# Patient Record
Sex: Male | Born: 1935 | Marital: Married | Smoking: Former smoker
Health system: Southern US, Community
[De-identification: ages and names within clinical notes are randomized; demographics above are authoritative.]

## PROBLEM LIST (undated history)

## (undated) DIAGNOSIS — N183 Chronic kidney disease, stage 3 unspecified: Secondary | ICD-10-CM

## (undated) DIAGNOSIS — E119 Type 2 diabetes mellitus without complications: Secondary | ICD-10-CM

## (undated) DIAGNOSIS — R011 Cardiac murmur, unspecified: Secondary | ICD-10-CM

## (undated) DIAGNOSIS — I340 Nonrheumatic mitral (valve) insufficiency: Secondary | ICD-10-CM

## (undated) DIAGNOSIS — I447 Left bundle-branch block, unspecified: Secondary | ICD-10-CM

## (undated) DIAGNOSIS — I509 Heart failure, unspecified: Secondary | ICD-10-CM

## (undated) DIAGNOSIS — I10 Essential (primary) hypertension: Secondary | ICD-10-CM

## (undated) DIAGNOSIS — I480 Paroxysmal atrial fibrillation: Secondary | ICD-10-CM

## (undated) DIAGNOSIS — E785 Hyperlipidemia, unspecified: Secondary | ICD-10-CM

## (undated) HISTORY — PX: OTHER SURGICAL HISTORY: SHX169

## (undated) HISTORY — DX: Cardiac murmur, unspecified: R01.1

## (undated) HISTORY — DX: Heart failure, unspecified: I50.9

## (undated) HISTORY — DX: Essential (primary) hypertension: I10

## (undated) HISTORY — PX: MOLE REMOVAL: SHX2046

## (undated) HISTORY — DX: Hyperlipidemia, unspecified: E78.5

---

## 2012-02-13 ENCOUNTER — Ambulatory Visit (INDEPENDENT_AMBULATORY_CARE_PROVIDER_SITE_OTHER): Payer: Medicare Other | Admitting: Family

## 2012-02-13 ENCOUNTER — Encounter: Payer: Self-pay | Admitting: Family

## 2012-02-13 VITALS — BP 150/82 | HR 47 | Temp 98.1°F | Ht 71.75 in | Wt 204.0 lb

## 2012-02-13 DIAGNOSIS — E119 Type 2 diabetes mellitus without complications: Secondary | ICD-10-CM

## 2012-02-13 DIAGNOSIS — I10 Essential (primary) hypertension: Secondary | ICD-10-CM

## 2012-02-13 DIAGNOSIS — N400 Enlarged prostate without lower urinary tract symptoms: Secondary | ICD-10-CM

## 2012-02-13 DIAGNOSIS — E7800 Pure hypercholesterolemia, unspecified: Secondary | ICD-10-CM

## 2012-02-13 LAB — PSA: PSA: 2.32 ng/mL (ref 0.10–4.00)

## 2012-02-13 LAB — HEPATIC FUNCTION PANEL
Alkaline Phosphatase: 49 U/L (ref 39–117)
Bilirubin, Direct: 0.1 mg/dL (ref 0.0–0.3)
Total Bilirubin: 0.8 mg/dL (ref 0.3–1.2)
Total Protein: 7.1 g/dL (ref 6.0–8.3)

## 2012-02-13 LAB — LIPID PANEL
HDL: 29.6 mg/dL — ABNORMAL LOW (ref >39.00)
LDL Cholesterol: 57 mg/dL (ref 0–99)
Total CHOL/HDL Ratio: 4
Triglycerides: 174 mg/dL — ABNORMAL HIGH (ref 0.0–149.0)
VLDL: 34.8 mg/dL (ref 0.0–40.0)

## 2012-02-13 LAB — HEMOGLOBIN A1C: Hgb A1c MFr Bld: 5.8 % (ref 4.6–6.5)

## 2012-02-13 MED ORDER — ATENOLOL 50 MG PO TABS: 50.0000 mg | ORAL_TABLET | Freq: Every day | ORAL | Status: DC

## 2012-02-13 MED ORDER — LOSARTAN POTASSIUM 100 MG PO TABS: 100.0000 mg | ORAL_TABLET | Freq: Every day | ORAL | Status: DC

## 2012-02-13 MED ORDER — HYDROCHLOROTHIAZIDE 25 MG PO TABS: 25.0000 mg | ORAL_TABLET | Freq: Every day | ORAL | Status: DC

## 2012-02-13 MED ORDER — SIMVASTATIN 20 MG PO TABS: 20.0000 mg | ORAL_TABLET | Freq: Every evening | ORAL | Status: DC

## 2012-02-13 MED ORDER — METFORMIN HCL ER (OSM) 500 MG PO TB24: 500.0000 mg | ORAL_TABLET | Freq: Two times a day (BID) | ORAL | Status: DC

## 2012-02-13 NOTE — Progress Notes (Signed)
Subjective:    Patient ID: Stanley Moore, male    DOB: 1931/05/07, 79 y.o.   MRN: [id number]  HPI 79 year old white male, nonsmoker, new patient to the practice is in to be established. Denies any concerns today. He has a history of type 2 diabetes, hypertension, hyperlipidemia and is currently doing well on this medications. Reports his blood pressure typically run in the 0000000 to Q000111Q systolically. Denies any lightheadedness, dizziness, chest pain, palpitations, shortness of breath or edema.  Review of Systems  Constitutional: Negative.   HENT: Negative.   Respiratory: Negative.   Cardiovascular: Negative.   Gastrointestinal: Negative.   Genitourinary: Negative.   Musculoskeletal: Negative.   Skin: Negative.   Neurological: Negative.   Hematological: Negative.   Psychiatric/Behavioral: Negative.    Past Medical History  Diagnosis Date  . Hyperlipidemia   . Hypertension     History   Social History  . Marital Status: Unknown    Spouse Name: N/A    Number of Children: N/A  . Years of Education: N/A   Occupational History  . Not on file.   Social History Main Topics  . Smoking status: Former Research scientist (life sciences)  . Smokeless tobacco: Not on file  . Alcohol Use: Yes     Comment: wine with dinner  . Drug Use: No  . Sexually Active:    Other Topics Concern  . Not on file   Social History Narrative  . No narrative on file    Past Surgical History  Procedure Date  . Mole removal     No family history on file.  No Known Allergies  Current Outpatient Prescriptions on File Prior to Visit  Medication Sig Dispense Refill  . atenolol (TENORMIN) 50 MG tablet Take 1 tablet (50 mg total) by mouth daily.  90 tablet  1  . hydrochlorothiazide (HYDRODIURIL) 25 MG tablet Take 1 tablet (25 mg total) by mouth daily.  90 tablet  1  . losartan (COZAAR) 100 MG tablet Take 1 tablet (100 mg total) by mouth daily.  90 tablet  1  . metformin (FORTAMET) 500 MG (OSM) 24 hr tablet Take 1  tablet (500 mg total) by mouth 2 (two) times daily with a meal.  180 tablet  1  . simvastatin (ZOCOR) 20 MG tablet Take 1 tablet (20 mg total) by mouth every evening.  90 tablet  1    BP 150/82  Pulse 47  Temp 98.1 F (36.7 C) (Oral)  Ht 5' 11.75" (1.822 m)  Wt 204 lb (92.534 kg)  BMI 27.86 kg/m2  SpO2 98%chart    Objective:   Physical Exam  Constitutional: He is oriented to person, place, and time. He appears well-developed and well-nourished.  HENT:  Right Ear: External ear normal.  Left Ear: External ear normal.  Nose: Nose normal.  Mouth/Throat: Oropharynx is clear and moist.  Neck: Normal range of motion. Neck supple.  Cardiovascular: Normal rate, regular rhythm and normal heart sounds.   Pulmonary/Chest: Effort normal and breath sounds normal.  Abdominal: Soft. Bowel sounds are normal.  Musculoskeletal: Normal range of motion.  Neurological: He is alert and oriented to person, place, and time. He has normal reflexes.  Skin: Skin is warm and dry.  Psychiatric: He has a normal mood and affect.          Assessment & Plan:  Assessment:  1. Type 2 DM 2. Hypertension 3. Hypercholesterolemia   Plan: Increase Cozaar to 100 mg daily. Continue all other medications the same. Lab sent.  Encouraged healthy diet, exercise. Will notify patient pending results of his labs. Recheck in 3-4 months for complete physical exam and sooner as needed.

## 2012-02-13 NOTE — Patient Instructions (Addendum)
Colonoscopy A colonoscopy is an exam to evaluate your entire colon. In this exam, your colon is cleansed. A long fiberoptic tube is inserted through your rectum and into your colon. The fiberoptic scope (endoscope) is a long bundle of enclosed and very flexible fibers. These fibers transmit light to the area examined and send images from that area to your caregiver. Discomfort is usually minimal. You may be given a drug to help you sleep (sedative) during or prior to the procedure. This exam helps to detect lumps (tumors), polyps, inflammation, and areas of bleeding. Your caregiver may also take a small piece of tissue (biopsy) that will be examined under a microscope. LET YOUR CAREGIVER KNOW ABOUT:   Allergies to food or medicine.  Medicines taken, including vitamins, herbs, eyedrops, over-the-counter medicines, and creams.  Use of steroids (by mouth or creams).  Previous problems with anesthetics or numbing medicines.  History of bleeding problems or blood clots.  Previous surgery.  Other health problems, including diabetes and kidney problems.  Possibility of pregnancy, if this applies. BEFORE THE PROCEDURE   A clear liquid diet may be required for 2 days before the exam.  Ask your caregiver about changing or stopping your regular medications.  Liquid injections (enemas) or laxatives may be required.  A large amount of electrolyte solution may be given to you to drink over a short period of time. This solution is used to clean out your colon.  You should be present 60 minutes prior to your procedure or as directed by your caregiver. AFTER THE PROCEDURE   If you received a sedative or pain relieving medication, you will need to arrange for someone to drive you home.  Occasionally, there is a little blood passed with the first bowel movement. Do not be concerned. FINDING OUT THE RESULTS OF YOUR TEST Not all test results are available during your visit. If your test results are  not back during the visit, make an appointment with your caregiver to find out the results. Do not assume everything is normal if you have not heard from your caregiver or the medical facility. It is important for you to follow up on all of your test results. HOME CARE INSTRUCTIONS   It is not unusual to pass moderate amounts of gas and experience mild abdominal cramping following the procedure. This is due to air being used to inflate your colon during the exam. Walking or a warm pack on your belly (abdomen) may help.  You may resume all normal meals and activities after sedatives and medicines have worn off.  Only take over-the-counter or prescription medicines for pain, discomfort, or fever as directed by your caregiver. Do not use aspirin or blood thinners if a biopsy was taken. Consult your caregiver for medicine usage if biopsies were taken. SEEK IMMEDIATE MEDICAL CARE IF:   You have a fever.  You pass large blood clots or fill a toilet with blood following the procedure. This may also occur 10 to 14 days following the procedure. This is more likely if a biopsy was taken.  You develop abdominal pain that keeps getting worse and cannot be relieved with medicine. Document Released: 12/21/1999 Document Revised: 03/17/2011 Document Reviewed: 08/05/2007 [employer] Patient Information 2013 [employer].  

## 2012-04-06 ENCOUNTER — Encounter: Payer: Self-pay | Admitting: Family

## 2012-04-06 ENCOUNTER — Ambulatory Visit (INDEPENDENT_AMBULATORY_CARE_PROVIDER_SITE_OTHER): Payer: Medicare Other | Admitting: Family

## 2012-04-06 VITALS — BP 164/80 | HR 68 | Wt 205.0 lb

## 2012-04-06 DIAGNOSIS — E119 Type 2 diabetes mellitus without complications: Secondary | ICD-10-CM

## 2012-04-06 DIAGNOSIS — E7800 Pure hypercholesterolemia, unspecified: Secondary | ICD-10-CM

## 2012-04-06 DIAGNOSIS — I10 Essential (primary) hypertension: Secondary | ICD-10-CM

## 2012-04-06 MED ORDER — LOSARTAN POTASSIUM-HCTZ 100-25 MG PO TABS: 1.0000 | ORAL_TABLET | Freq: Every day | ORAL | Status: DC

## 2012-04-06 MED ORDER — ATENOLOL 50 MG PO TABS: 50.0000 mg | ORAL_TABLET | Freq: Two times a day (BID) | ORAL | Status: DC

## 2012-04-06 NOTE — Patient Instructions (Addendum)
1. You are beautiful! 2. Continue exercise 3. Watch salt intake.  4. I have changed Cozaar and HCTZ to 1 pill. So the next time you get it filled, it will be 1 pill.  Hypertension As your heart beats, it forces blood through your arteries. This force is your blood pressure. If the pressure is too high, it is called hypertension (HTN) or high blood pressure. HTN is dangerous because you may have it and not know it. High blood pressure may mean that your heart has to work harder to pump blood. Your arteries may be narrow or stiff. The extra work puts you at risk for heart disease, stroke, and other problems.  Blood pressure consists of two numbers, a higher number over a lower, 110/72, for example. It is stated as "110 over 72." The ideal is below 120 for the top number (systolic) and under 80 for the bottom (diastolic). Write down your blood pressure today. You should pay close attention to your blood pressure if you have certain conditions such as:  Heart failure.  Prior heart attack.  Diabetes  Chronic kidney disease.  Prior stroke.  Multiple risk factors for heart disease. To see if you have HTN, your blood pressure should be measured while you are seated with your arm held at the level of the heart. It should be measured at least twice. A one-time elevated blood pressure reading (especially in the Emergency Department) does not mean that you need treatment. There may be conditions in which the blood pressure is different between your right and left arms. It is important to see your caregiver soon for a recheck. Most people have essential hypertension which means that there is not a specific cause. This type of high blood pressure may be lowered by changing lifestyle factors such as:  Stress.  Smoking.  Lack of exercise.  Excessive weight.  Drug/tobacco/alcohol use.  Eating less salt. Most people do not have symptoms from high blood pressure until it has caused damage to the  body. Effective treatment can often prevent, delay or reduce that damage. TREATMENT  When a cause has been identified, treatment for high blood pressure is directed at the cause. There are a large number of medications to treat HTN. These fall into several categories, and your caregiver will help you select the medicines that are best for you. Medications may have side effects. You should review side effects with your caregiver. If your blood pressure stays high after you have made lifestyle changes or started on medicines,   Your medication(s) may need to be changed.  Other problems may need to be addressed.  Be certain you understand your prescriptions, and know how and when to take your medicine.  Be sure to follow up with your caregiver within the time frame advised (usually within two weeks) to have your blood pressure rechecked and to review your medications.  If you are taking more than one medicine to lower your blood pressure, make sure you know how and at what times they should be taken. Taking two medicines at the same time can result in blood pressure that is too low. SEEK IMMEDIATE MEDICAL CARE IF:  You develop a severe headache, blurred or changing vision, or confusion.  You have unusual weakness or numbness, or a faint feeling.  You have severe chest or abdominal pain, vomiting, or breathing problems. MAKE SURE YOU:   Understand these instructions.  Will watch your condition.  Will get help right away if you are not  doing well or get worse. Document Released: 12/23/2004 Document Revised: 03/17/2011 Document Reviewed: 08/13/2007 [facility] Patient Information 2013 [place].

## 2012-04-06 NOTE — Progress Notes (Signed)
Subjective:    Patient ID: Stanley Moore, male    DOB: Jan 08, 1931, 79 y.o.   MRN: [id number]  HPI  79 year old male, nonsmoker is in for recheck of hypertension, hyperlipidemia, type 2 diabetes-controlled, hyperlipidemia. He is currently tolerating his medications well. Denies any concerns today. Blood pressure readings at home have been in the [id number] to 123456 systolically. He has been taken atenolol 50 mg twice a day instead of once a day. He has noticed that her blood pressure readings since then.    Review of Systems  Constitutional: Negative.   HENT: Negative.   Eyes: Negative.   Respiratory: Negative.   Cardiovascular: Negative.   Gastrointestinal: Negative.   Endocrine: Negative.   Genitourinary: Negative.   Musculoskeletal: Negative.   Skin: Negative.   Allergic/Immunologic: Negative.   Neurological: Negative.   Hematological: Negative.   Psychiatric/Behavioral: Negative.    Past Medical History  Diagnosis Date  . Hyperlipidemia   . Hypertension     History   Social History  . Marital Status: Unknown    Spouse Name: N/A    Number of Children: N/A  . Years of Education: N/A   Occupational History  . Not on file.   Social History Main Topics  . Smoking status: Former Research scientist (life sciences)  . Smokeless tobacco: Not on file  . Alcohol Use: Yes     Comment: wine with dinner  . Drug Use: No  . Sexually Active:    Other Topics Concern  . Not on file   Social History Narrative  . No narrative on file    Past Surgical History  Procedure Laterality Date  . Mole removal      No family history on file.  No Known Allergies  Current Outpatient Prescriptions on File Prior to Visit  Medication Sig Dispense Refill  . aspirin 81 MG tablet Take 81 mg by mouth daily.      . metformin (FORTAMET) 500 MG (OSM) 24 hr tablet Take 1 tablet (500 mg total) by mouth 2 (two) times daily with a meal.  180 tablet  1  . NON FORMULARY Joint Softner      . simvastatin (ZOCOR) 20 MG  tablet Take 1 tablet (20 mg total) by mouth every evening.  90 tablet  1   No current facility-administered medications on file prior to visit.    BP 164/80  Pulse 68  Wt 205 lb (92.987 kg)  BMI 28.01 kg/m2  SpO2 98%chart    Objective:   Physical Exam  Constitutional: He is oriented to person, place, and time. He appears well-developed and well-nourished.  HENT:  Head: Normocephalic.  Right Ear: External ear normal.  Left Ear: External ear normal.  Nose: Nose normal.  Mouth/Throat: Oropharynx is clear and moist.  Eyes: Conjunctivae are normal. Pupils are equal, round, and reactive to light.  Neck: Normal range of motion. Neck supple. No thyromegaly present.  Cardiovascular: Normal rate, regular rhythm and normal heart sounds.   Pulmonary/Chest: Effort normal and breath sounds normal.  Abdominal: Soft. Bowel sounds are normal. He exhibits no distension. There is no tenderness. There is no rebound.  Musculoskeletal: Normal range of motion.  Neurological: He is alert and oriented to person, place, and time.  Skin: Skin is warm and dry.  Psychiatric: He has a normal mood and affect.          Assessment & Plan:  Assessment:  1. Type 2 Diabetes  2. Hypertension 3. Hypercholesterolemia  Plan: Continue current medications. After his  90 day supply of Cozaar and hydrochlorothiazide her runs out, we will do Hyzaar. Continue exercising. Followup in 3 months for fasting labs and sooner as needed.

## 2012-09-10 ENCOUNTER — Telehealth: Payer: Self-pay | Admitting: Family

## 2012-09-10 MED ORDER — METFORMIN HCL 500 MG PO TABS: 500.0000 mg | ORAL_TABLET | Freq: Two times a day (BID) | ORAL | Status: DC

## 2012-09-10 NOTE — Telephone Encounter (Signed)
I will switch to metformin 500mg  twice a day

## 2012-09-10 NOTE — Telephone Encounter (Signed)
Pt aware.

## 2012-09-10 NOTE — Telephone Encounter (Signed)
Pt states he was told by [facility] that the next time he reorders it will cost over $500, due to the cost of this particular med. metformin (FORTAMET) 500 MG (OSM) 24 hr tablet Pt is thinkif he could switch to a less expensive med.pt has 90  day w/ this last refill.

## 2012-12-16 ENCOUNTER — Encounter: Payer: Self-pay | Admitting: Family

## 2012-12-16 ENCOUNTER — Ambulatory Visit (INDEPENDENT_AMBULATORY_CARE_PROVIDER_SITE_OTHER): Payer: Medicare Other | Admitting: Family

## 2012-12-16 VITALS — BP 150/76 | HR 66 | Wt 206.0 lb

## 2012-12-16 DIAGNOSIS — E7800 Pure hypercholesterolemia, unspecified: Secondary | ICD-10-CM

## 2012-12-16 DIAGNOSIS — I10 Essential (primary) hypertension: Secondary | ICD-10-CM

## 2012-12-16 DIAGNOSIS — E119 Type 2 diabetes mellitus without complications: Secondary | ICD-10-CM

## 2012-12-16 LAB — HEPATIC FUNCTION PANEL
ALT: 12 U/L (ref 0–53)
Total Protein: 7.6 g/dL (ref 6.0–8.3)

## 2012-12-16 LAB — BASIC METABOLIC PANEL
BUN: 32 mg/dL — ABNORMAL HIGH (ref 6–23)
CO2: 27 mEq/L (ref 19–32)
Calcium: 8.9 mg/dL (ref 8.4–10.5)
Chloride: 103 mEq/L (ref 96–112)
Creatinine, Ser: 1.6 mg/dL — ABNORMAL HIGH (ref 0.4–1.5)

## 2012-12-16 LAB — LIPID PANEL
Cholesterol: 145 mg/dL (ref 0–200)
Total CHOL/HDL Ratio: 5

## 2012-12-16 MED ORDER — SIMVASTATIN 20 MG PO TABS: 20.0000 mg | ORAL_TABLET | Freq: Every evening | ORAL | Status: DC

## 2012-12-16 MED ORDER — ATENOLOL 50 MG PO TABS: 50.0000 mg | ORAL_TABLET | Freq: Two times a day (BID) | ORAL | Status: DC

## 2012-12-16 MED ORDER — LOSARTAN POTASSIUM-HCTZ 100-25 MG PO TABS: 1.0000 | ORAL_TABLET | Freq: Every day | ORAL | Status: DC

## 2012-12-16 MED ORDER — METFORMIN HCL 500 MG PO TABS: 500.0000 mg | ORAL_TABLET | Freq: Two times a day (BID) | ORAL | Status: DC

## 2012-12-16 NOTE — Progress Notes (Signed)
   Subjective:    Patient ID: Stanley Moore, male    DOB: 12/07/1931, 79 y.o.   MRN: [id number]  HPI 78 year old white male, nonsmoker is in for recheck of head 2 diabetes, hypertension, hyperlipidemia. He is currently doing well. Stable on medications. Does not routinely check his blood sugars. Last hemoglobin A1c 5.8. Needs medication refills today.   Review of Systems  Constitutional: Negative.   Respiratory: Negative.   Cardiovascular: Negative.   Gastrointestinal: Negative.   Endocrine: Negative.   Genitourinary: Negative.   Musculoskeletal: Negative.   Skin: Negative.   Neurological: Negative.   Hematological: Negative.   Psychiatric/Behavioral: Negative.    Past Medical History  Diagnosis Date  . Hyperlipidemia   . Hypertension     History   Social History  . Marital Status: Unknown    Spouse Name: N/A    Number of Children: N/A  . Years of Education: N/A   Occupational History  . Not on file.   Social History Main Topics  . Smoking status: Former Research scientist (life sciences)  . Smokeless tobacco: Not on file  . Alcohol Use: Yes     Comment: wine with dinner  . Drug Use: No  . Sexual Activity:    Other Topics Concern  . Not on file   Social History Narrative  . No narrative on file    Past Surgical History  Procedure Laterality Date  . Mole removal      No family history on file.  No Known Allergies  Current Outpatient Prescriptions on File Prior to Visit  Medication Sig Dispense Refill  . aspirin 81 MG tablet Take 81 mg by mouth daily.      . NON FORMULARY Joint Softner       No current facility-administered medications on file prior to visit.    BP 150/76  Pulse 66  Wt 206 lb (93.441 kg)chart    Objective:   Physical Exam  Constitutional: He is oriented to person, place, and time. He appears well-developed and well-nourished.  HENT:  Right Ear: External ear normal.  Left Ear: External ear normal.  Nose: Nose normal.  Mouth/Throat: Oropharynx  is clear and moist.  Neck: Normal range of motion. Neck supple.  Cardiovascular: Normal rate, regular rhythm and normal heart sounds.   Pulmonary/Chest: Effort normal and breath sounds normal.  Abdominal: Soft. Bowel sounds are normal.  Musculoskeletal: Normal range of motion.  Neurological: He is alert and oriented to person, place, and time.  Skin: Skin is warm and dry.  Psychiatric: He has a normal mood and affect.          Assessment & Plan:  Assessment: 1. Hypertension 2. Type 2 diabetes 3. Hyperlipidemia  Plan: Continue current medications. Last in to include BMP, CBC, lipids, A1c notify patient of results. Her child the diet, low-sodium, exercise. Return for complete physical exam in 6 months and sooner as needed. Medications refilled.

## 2012-12-16 NOTE — Patient Instructions (Signed)
Sodium-Controlled Diet Sodium is a mineral. It is found in many foods. Sodium may be found naturally or added during the making of a food. The most common form of sodium is salt, which is made up of sodium and chloride. Reducing your sodium intake involves changing your eating habits. The following guidelines will help you reduce the sodium in your diet:  Stop using the salt shaker.  Use salt sparingly in cooking and baking.  Substitute with sodium-free seasonings and spices.  Do not use a salt substitute (potassium chloride) without your caregiver's permission.  Include a variety of fresh, unprocessed foods in your diet.  Limit the use of processed and convenience foods that are high in sodium. USE THE FOLLOWING FOODS SPARINGLY: Breads/Starches  Commercial bread stuffing, commercial pancake or waffle mixes, coating mixes. Waffles. Croutons. Prepared (boxed or frozen) potato, rice, or noodle mixes that contain salt or sodium. Salted French fries or hash browns. Salted popcorn, breads, crackers, chips, or snack foods. Vegetables  Vegetables canned with salt or prepared in cream, butter, or cheese sauces. Sauerkraut. Tomato or vegetable juices canned with salt.  Fresh vegetables are allowed if rinsed thoroughly. Fruit  Fruit is okay to eat. Meat and Meat Substitutes  Salted or smoked meats, such as bacon or Canadian bacon, chipped or corned beef, hot dogs, salt pork, luncheon meats, pastrami, ham, or sausage. Canned or smoked fish, poultry, or meat. Processed cheese or cheese spreads, blue or Roquefort cheese. Battered or frozen fish products. Prepared spaghetti sauce. Baked beans. Reuben sandwiches. Salted nuts. Caviar. Milk  Limit buttermilk to 1 cup per week. Soups and Combination Foods  Bouillon cubes, canned or dried soups, broth, consomm. Convenience (frozen or packaged) dinners with more than 600 mg sodium. Pot pies, pizza, Asian food, fast food cheeseburgers, and specialty  sandwiches. Desserts and Sweets  Regular (salted) desserts, pie, commercial fruit snack pies, commercial snack cakes, canned puddings.  Eat desserts and sweets in moderation. Fats and Oils  Gravy mixes or canned gravy. No more than 1 to 2 tbs of salad dressing. Chip dips.  Eat fats and oils in moderation. Beverages  See those listed under the vegetables and milk groups. Condiments  Ketchup, mustard, meat sauces, salsa, regular (salted) and lite soy sauce or mustard. Dill pickles, olives, meat tenderizer. Prepared horseradish or pickle relish. Dutch-processed cocoa. Baking powder or baking soda used medicinally. Worcestershire sauce. "Light" salt. Salt substitute, unless approved by your caregiver. Document Released: 06/14/2001 Document Revised: 03/17/2011 Document Reviewed: 01/15/2009 [employer] Patient Information 2014 [employer].  

## 2012-12-16 NOTE — Addendum Note (Signed)
Addended by: Santiago Bumpers on: 12/16/2012 01:39 PM   Modules accepted: Orders

## 2012-12-16 NOTE — Progress Notes (Signed)
Pre visit review using our clinic review tool, if applicable. No additional management support is needed unless otherwise documented below in the visit note. 

## 2012-12-20 ENCOUNTER — Other Ambulatory Visit: Payer: Self-pay

## 2012-12-20 MED ORDER — LOSARTAN POTASSIUM-HCTZ 100-25 MG PO TABS: 1.0000 | ORAL_TABLET | Freq: Every day | ORAL | Status: DC

## 2012-12-20 MED ORDER — ATENOLOL 50 MG PO TABS: 50.0000 mg | ORAL_TABLET | Freq: Two times a day (BID) | ORAL | Status: DC

## 2013-06-16 ENCOUNTER — Encounter: Payer: Medicare Other | Admitting: Family

## 2019-05-29 NOTE — Progress Notes (Signed)
Cardiology Office Note:   Date:  05/31/2019  NAME:  Stanley Moore    MRN: [id number] DOB:  [birth year]   PCP:  No primary care provider on file.  Cardiologist:  No primary care provider on file.  Electrophysiologist:  None   Referring MD: Stanley Moore, *   Chief Complaint  Patient presents with  . Atrial Fibrillation   History of Present Illness:   Stanley Moore is a 86 y.o. male with a hx of DM, HTN, CKD who is being seen today for the evaluation of atrial fibrillation at the request of Stanley Moore, Stanley Moore, *. Evaluated by PCP 5/18 and found to be in Afib. Sent for evaluation.   He reports over the last 3 to 4 months has become more short of breath with activity.  He reports walking around the house is not an issue for him.  He does have stairs in the home and when he climbs stairs he does get short of breath.  He reports this occurs pretty routinely with any strenuous activity.  He denies any chest pain with the shortness of breath.  He does present with his wife today.  He apparently is very inactive.  He does not exercise at all and is mainly at home watching TV.  He does have a history of hypertension and diabetes.  His most recent A1c is 6.8.  That does not seem to be an issue here.  He reports he had a heart murmur that was diagnosed 40 years ago.  He is never had an echocardiogram.  His symptoms seem to have coincided with a recent diagnosis of atrial fibrillation.  His heart rate today is 82.  We do not know when he went in atrial fibrillation.  He is not on anticoagulation.  His most recent creatinine was 1.55.  Kidney function has been stable and clearly with creatinine of 1.5 for the last few months.  He denies any chest pain, shortness of breath or palpitations today.  He denies any lower extremity edema.  He is a former smoker but quit a long time ago.  He only smoked for about 5 years.  There is no strong family history of heart disease.  He consumes 1 glass  of wine per night.  No excess alcohol consumption reported.  No illicit drug use reported.  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 HTN 2. HLD -T chol 114, LDL 72, TG 99, HDL 37 3. DM -A1c 6.8 4. CKD 3 -1.55  Past Medical History: Past Medical History:  Diagnosis Date  . Heart murmur   . Hyperlipidemia   . Hypertension     Past Surgical History: Past Surgical History:  Procedure Laterality Date  . appendicitis    . MOLE REMOVAL      Current Medications: Current Meds  Medication Sig  . atenolol (TENORMIN) 50 MG tablet Take 1 tablet (50 mg total) by mouth 2 (two) times daily.  Marland Kitchen GARLIC OIL PO Take by mouth.  . losartan-hydrochlorothiazide (HYZAAR) 100-25 MG per tablet Take 1 tablet by mouth daily.  . metFORMIN (GLUCOPHAGE) 500 MG tablet Take 1 tablet (500 mg total) by mouth 2 (two) times daily with a meal.  . NON FORMULARY Joint Softner  . OIL OF OREGANO PO Take by mouth.  . simvastatin (ZOCOR) 20 MG tablet Take 1 tablet (20 mg total) by mouth every evening.  . TURMERIC PO Take by mouth.  . [DISCONTINUED] aspirin 81 MG tablet Take 81  mg by mouth daily.     Allergies:    Patient has no known allergies.   Social History: Social History   Socioeconomic History  . Marital status: Married    Spouse name: Not on file  . Number of children: 3  . Years of education: Not on file  . Highest education level: Not on file  Occupational History  . Not on file  Tobacco Use  . Smoking status: Former Smoker    Years: 5.00  . Smokeless tobacco: Never Used  Substance and Sexual Activity  . Alcohol use: Yes    Comment: wine with dinner  . Drug use: No  . Sexual activity: Not on file  Other Topics Concern  . Not on file  Social History Narrative  . Not on file   Social Determinants of Health   Financial Resource Strain:   . Difficulty of Paying Living Expenses:   Food Insecurity:   . Worried About Charity fundraiser in the Last Year:   . Arboriculturist in the  Last Year:   Transportation Needs:   . Film/video editor (Medical):   Marland Kitchen Lack of Transportation (Non-Medical):   Physical Activity:   . Days of Exercise per Week:   . Minutes of Exercise per Session:   Stress:   . Feeling of Stress :   Social Connections:   . Frequency of Communication with Friends and Family:   . Frequency of Social Gatherings with Friends and Family:   . Attends Religious Services:   . Active Member of Clubs or Organizations:   . Attends Archivist Meetings:   Marland Kitchen Marital Status:     Family History: The patient's family history includes Diabetes in his mother.  ROS:   All other ROS reviewed and negative. Pertinent positives noted in the HPI.     EKGs/Labs/Other Studies Reviewed:   The following studies were personally reviewed by me today:  EKG:  EKG is ordered today.  The ekg ordered today demonstrates atrial fibrillation, heart rate 82, nonspecific intraventricular conduction delay, 154 ms, and was personally reviewed by me.   Recent Labs: No results found for requested labs within last 8760 hours.   Recent Lipid Panel    Component Value Date/Time   CHOL 145 12/16/2012 1228   TRIG 219.0 (H) 12/16/2012 1228   HDL 32.20 (L) 12/16/2012 1228   CHOLHDL 5 12/16/2012 1228   VLDL 43.8 (H) 12/16/2012 1228   LDLCALC 57 02/13/2012 0909   LDLDIRECT 80.7 12/16/2012 1228    Physical Exam:   VS:  BP 130/84   Pulse 82   Ht 5\' 11"  (1.803 m)   Wt 191 lb 9.6 oz (86.9 kg)   SpO2 99%   BMI 26.72 kg/m    Wt Readings from Last 3 Encounters:  05/31/19 191 lb 9.6 oz (86.9 kg)  12/16/12 206 lb (93.4 kg)  04/06/12 205 lb (93 kg)    General: Well nourished, well developed, in no acute distress Heart: Atraumatic, normal size  Eyes: PEERLA, EOMI  Neck: Supple, no JVD Endocrine: No thryomegaly Cardiac: Irregular rhythm, no murmurs rubs or gallops Lungs: Clear to auscultation bilaterally, no wheezing, rhonchi or rales  Abd: Soft, nontender, no  hepatomegaly  Ext: No edema, pulses 2+ Musculoskeletal: No deformities, BUE and BLE strength normal and equal Skin: Warm and dry, no rashes   Neuro: Alert and oriented to person, place, time, and situation, CNII-XII grossly intact, no focal deficits  Psych: Normal  mood and affect   ASSESSMENT:   Stanley Moore is a 86 y.o. male who presents for the following: 1. Persistent atrial fibrillation ([place])   2. SOB (shortness of breath)   3. Essential hypertension   4. Mixed hyperlipidemia     PLAN:   1. Persistent atrial fibrillation ([place]) 2. SOB (shortness of breath) -He presents with worsening shortness of breath the last 3 to 4 months.  EKG shows atrial fibrillation with heart rate 82.  He does have a nonspecific intraventricular conduction delay.  No symptoms of chest pain to suggest underlying CAD.  Unclear if his symptoms are related to deconditioning versus new diagnosis of atrial fibrillation.  We will check a BMP, BNP, echocardiogram and thyroid studies today.  I did discuss and recommend a cardioversion with him.  He reports he would like to think about this. -His chads vas score is 4.  I recommended Eliquis.  Due to his age over 40 and creatinine of 1.5 we will start with 2.5 mg twice daily of Eliquis.  He will see me back in 2 to 3 weeks to discuss a cardioversion.  He would like to read into this a bit more. -For now we will continue with rate control with atenolol. -He has no symptoms of heart failure and no significant murmurs on exam.  I highly doubt he has structural heart disease but we will see.  I did inquire about sleep apnea.  He reports that he does snore but he is not excessively tired.  We should think about this in the future.  3. Essential hypertension -Well-controlled.  No change in medications.  4. Mixed hyperlipidemia -Continue simvastatin  Disposition: Return in about 2 weeks (around 06/14/2019).  Medication Adjustments/Labs and Tests Ordered: Current  medicines are reviewed at length with the patient today.  Concerns regarding medicines are outlined above.  Orders Placed This Encounter  Procedures  . Basic metabolic panel  . Brain natriuretic peptide  . TSH  . EKG 12-Lead  . ECHOCARDIOGRAM COMPLETE   Meds ordered this encounter  Medications  . apixaban (ELIQUIS) 2.5 MG TABS tablet    Sig: Take 1 tablet (2.5 mg total) by mouth 2 (two) times daily.    Dispense:  60 tablet    Refill:  2    Patient Instructions  Medication Instructions:  Stop Aspirin Start Eliquis 2.5 mg twice daily   *If you need a refill on your cardiac medications before your next appointment, please call your pharmacy*   Lab Work: BMET, BNP, TSH at [address] [facility]- when he goes for ECHO- please schedule.   If you have labs (blood work) drawn today and your tests are completely normal, you will receive your results only by: Marland Kitchen MyChart Message (if you have MyChart) OR . A paper copy in the mail If you have any lab test that is abnormal or we need to change your treatment, we will call you to review the results.   Testing/Procedures: Echocardiogram - Your physician has requested that you have an echocardiogram. Echocardiography is a painless test that uses sound waves to create images of your heart. It provides your doctor with information about the size and shape of your heart and how well your heart's chambers and valves are working. This procedure takes approximately one hour. There are no restrictions for this procedure. This will be performed at our [facility] location - [address].    Follow-Up: At [facility], you and your  health needs are our priority.  As part of our continuing mission to provide you with exceptional heart care, we have created designated Provider Care Teams.  These Care Teams include your primary Cardiologist (physician) and Advanced Practice Providers (APPs -  Physician Assistants and Nurse Practitioners) who all  work together to provide you with the care you need, when you need it.  We recommend signing up for the patient portal called "MyChart".  Sign up information is provided on this After Visit Summary.  MyChart is used to connect with patients for Virtual Visits (Telemedicine).  Patients are able to view lab/test results, encounter notes, upcoming appointments, etc.  Non-urgent messages can be sent to your provider as well.   To learn more about what you can do with MyChart, go to [email or web address].    Your next appointment:   2 week(s)  The format for your next appointment:   In Person  Provider:   Eleonore Chiquito, MD      Signed, Addison Naegeli. Audie Box, [place]  [address], [place], [place] [address] [phone]  05/31/2019 5:17 PM

## 2019-05-31 ENCOUNTER — Encounter: Payer: Self-pay | Admitting: Cardiovascular Disease

## 2019-05-31 ENCOUNTER — Other Ambulatory Visit: Payer: Self-pay

## 2019-05-31 ENCOUNTER — Ambulatory Visit (INDEPENDENT_AMBULATORY_CARE_PROVIDER_SITE_OTHER): Payer: Medicare HMO | Admitting: Cardiovascular Disease

## 2019-05-31 VITALS — BP 130/84 | HR 82 | Ht 71.0 in | Wt 191.6 lb

## 2019-05-31 DIAGNOSIS — I4819 Other persistent atrial fibrillation: Secondary | ICD-10-CM | POA: Diagnosis not present

## 2019-05-31 DIAGNOSIS — R0602 Shortness of breath: Secondary | ICD-10-CM

## 2019-05-31 DIAGNOSIS — I10 Essential (primary) hypertension: Secondary | ICD-10-CM

## 2019-05-31 DIAGNOSIS — E782 Mixed hyperlipidemia: Secondary | ICD-10-CM

## 2019-05-31 MED ORDER — APIXABAN 2.5 MG PO TABS: 2.5000 mg | ORAL_TABLET | Freq: Two times a day (BID) | ORAL | 2 refills | Status: DC

## 2019-05-31 NOTE — Patient Instructions (Addendum)
Medication Instructions:  Stop Aspirin Start Eliquis 2.5 mg twice daily   *If you need a refill on your cardiac medications before your next appointment, please call your pharmacy*   Lab Work: BMET, BNP, TSH at [address] lab- when he goes for ECHO- please schedule.   If you have labs (blood work) drawn today and your tests are completely normal, you will receive your results only by: Marland Kitchen MyChart Message (if you have MyChart) OR . A paper copy in the mail If you have any lab test that is abnormal or we need to change your treatment, we will call you to review the results.   Testing/Procedures: Echocardiogram - Your physician has requested that you have an echocardiogram. Echocardiography is a painless test that uses sound waves to create images of your heart. It provides your doctor with information about the size and shape of your heart and how well your heart's chambers and valves are working. This procedure takes approximately one hour. There are no restrictions for this procedure. This will be performed at our [facility] location - [address], [address].    Follow-Up: At [facility], you and your health needs are our priority.  As part of our continuing mission to provide you with exceptional heart care, we have created designated Provider Care Teams.  These Care Teams include your primary Cardiologist (physician) and Advanced Practice Providers (APPs -  Physician Assistants and Nurse Practitioners) who all work together to provide you with the care you need, when you need it.  We recommend signing up for the patient portal called "MyChart".  Sign up information is provided on this After Visit Summary.  MyChart is used to connect with patients for Virtual Visits (Telemedicine).  Patients are able to view lab/test results, encounter notes, upcoming appointments, etc.  Non-urgent messages can be sent to your provider as well.   To learn more about what you can do with MyChart, go  to [email or web address].    Your next appointment:   2 week(s)  The format for your next appointment:   In Person  Provider:   Eleonore Chiquito, MD

## 2019-06-01 ENCOUNTER — Ambulatory Visit (HOSPITAL_COMMUNITY)
Admission: RE | Admit: 2019-06-01 | Discharge: 2019-06-01 | Disposition: A | Discharge status: Home / Self Care | Payer: Medicare HMO | Source: Ambulatory Visit | Attending: Cardiovascular Disease | Admitting: Cardiovascular Disease

## 2019-06-01 DIAGNOSIS — E785 Hyperlipidemia, unspecified: Secondary | ICD-10-CM | POA: Insufficient documentation

## 2019-06-01 DIAGNOSIS — I4819 Other persistent atrial fibrillation: Secondary | ICD-10-CM | POA: Insufficient documentation

## 2019-06-01 DIAGNOSIS — I129 Hypertensive chronic kidney disease with stage 1 through stage 4 chronic kidney disease, or unspecified chronic kidney disease: Secondary | ICD-10-CM | POA: Diagnosis not present

## 2019-06-01 DIAGNOSIS — N189 Chronic kidney disease, unspecified: Secondary | ICD-10-CM | POA: Diagnosis not present

## 2019-06-01 DIAGNOSIS — E1122 Type 2 diabetes mellitus with diabetic chronic kidney disease: Secondary | ICD-10-CM | POA: Diagnosis not present

## 2019-06-01 DIAGNOSIS — I081 Rheumatic disorders of both mitral and tricuspid valves: Secondary | ICD-10-CM | POA: Insufficient documentation

## 2019-06-01 NOTE — Progress Notes (Signed)
  Echocardiogram 2D Echocardiogram has been performed.  Darlina Sicilian M 06/01/2019, 9:14 AM

## 2019-06-03 ENCOUNTER — Other Ambulatory Visit: Payer: Self-pay

## 2019-06-03 ENCOUNTER — Telehealth: Payer: Self-pay | Admitting: Cardiovascular Disease

## 2019-06-03 LAB — BASIC METABOLIC PANEL
BUN/Creatinine Ratio: 17 (ref 10–24)
BUN: 30 mg/dL — ABNORMAL HIGH (ref 8–27)
CO2: 23 mmol/L (ref 20–29)
Calcium: 9 mg/dL (ref 8.6–10.2)
Chloride: 98 mmol/L (ref 96–106)
Creatinine, Ser: 1.72 mg/dL — ABNORMAL HIGH (ref 0.76–1.27)
GFR calc Af Amer: 41 mL/min/{1.73_m2} — ABNORMAL LOW (ref >59)
GFR calc non Af Amer: 35 mL/min/{1.73_m2} — ABNORMAL LOW (ref >59)
Glucose: 177 mg/dL — ABNORMAL HIGH (ref 65–99)
Potassium: 4.5 mmol/L (ref 3.5–5.2)
Sodium: 137 mmol/L (ref 134–144)

## 2019-06-03 LAB — TSH: TSH: 2.74 u[IU]/mL (ref 0.450–4.500)

## 2019-06-03 LAB — BRAIN NATRIURETIC PEPTIDE: BNP: 321.8 pg/mL — ABNORMAL HIGH (ref 0.0–100.0)

## 2019-06-03 MED ORDER — CARVEDILOL 12.5 MG PO TABS
12.5000 mg | ORAL_TABLET | Freq: Two times a day (BID) | ORAL | 3 refills | Status: DC
Start: 2019-06-03 — End: 2019-06-21

## 2019-06-03 NOTE — Telephone Encounter (Signed)
Med list updated- new RX sent to pharmacy.   Thanks!

## 2019-06-03 NOTE — Telephone Encounter (Signed)
Called Mr. Veney. Discussed echo which shows EF 30-35% and possible prior infarct. He is without symptoms and doing well. Denies CP. He reports his SOB is still there but not bothersome.   Will stop atenolol and start coreg 12.5 mg BID.   No need for lasix as no LE edema.   Will need ischemia evaluation. May need to delay DCCV. Will discuss stress test vs LHC at next appointment.   [place] T. Audie Box, [place]  [address] [place], [place] [address] [phone]  10:45 AM

## 2019-06-19 NOTE — H&P (View-Only) (Signed)
Cardiology Office Note:   Date:  06/21/2019  NAME:  Stanley Moore    MRN: [id number] DOB:  1931/10/30   PCP:  Patient, No Pcp Per  Cardiologist:  No primary care provider on file.   Referring MD: No ref. provider found   Chief Complaint  Patient presents with  . Congestive Heart Failure   History of Present Illness:   Stanley Moore is a 86 y.o. male with a hx of persistent Afib, DM, CKD, HTN who presents for follow-up. Evaluated 05/31/2019 for persistent Afib. Found to have new cardiomyopathy 30-35%.   He reports he is doing well.  He still gets short of breath when he does heavy exertion such as climbing a flight of stairs.  His echocardiogram shows an EF 30-35%.  He does have elevated RVSP.  I suspect he has symptomatic heart failure.  He denies any chest pain.  He is never had a heart attack or stroke.  He has been diabetic for about 4 years.  Most recent A1c was 6.8.  We did start him on carvedilol he is tolerating this well.  His blood pressure is 152/78.  He has no gross volume overload I suspect he has extra volume blood.  Weights are stable.  He does have CKD.  I did offer him a stress test versus cardiac cath.  He has opted for a stress test.  Regarding his A. fib he is on Eliquis 2.5 mg twice daily.  He is over 80 with a creatinine of 1.5.  This is the appropriate dose.  He said no missed doses in the past 3 weeks.  We will plan for cardioversion next week.  I will then see him back in 2 weeks after that.  He denies chest pain, shortness of breath or palpitations today.  He apparently does well with flat surfaces and inclines are an issue.  Problem List 1. HTN 2. HLD -T chol 114, LDL 72, TG 99, HDL 37 3. DM -A1c 6.8 4. CKD 3 -1.55 5. Persistent Afib 6. Systolic HF, 62-69%  Past Medical History: Past Medical History:  Diagnosis Date  . CHF (congestive heart failure) ([place])   . Heart murmur   . Hyperlipidemia   . Hypertension     Past Surgical  History: Past Surgical History:  Procedure Laterality Date  . appendicitis    . MOLE REMOVAL      Current Medications: Current Meds  Medication Sig  . apixaban (ELIQUIS) 2.5 MG TABS tablet Take 1 tablet (2.5 mg total) by mouth 2 (two) times daily.  . carvedilol (COREG) 12.5 MG tablet Take 1 tablet (12.5 mg total) by mouth 2 (two) times daily.  . clindamycin (CLEOCIN) 300 MG capsule Take 300 mg by mouth 3 (three) times daily.  Marland Kitchen GARLIC OIL PO Take by mouth.  . metFORMIN (GLUCOPHAGE) 500 MG tablet Take 1 tablet (500 mg total) by mouth 2 (two) times daily with a meal.  . NON FORMULARY Joint Softner  . OIL OF [place]REGANO PO Take by mouth.  . simvastatin (ZOCOR) 20 MG tablet Take 1 tablet (20 mg total) by mouth every evening.  . TURMERIC PO Take by mouth.  . [DISCONTINUED] carvedilol (COREG) 12.5 MG tablet Take 1 tablet (12.5 mg total) by mouth 2 (two) times daily.  . [DISCONTINUED] losartan-hydrochlorothiazide (HYZAAR) 100-25 MG per tablet Take 1 tablet by mouth daily.     Allergies:    Patient has no known allergies.   Social History: Social History  Socioeconomic History  . Marital status: Married    Spouse name: Not on file  . Number of children: 3  . Years of education: Not on file  . Highest education level: Not on file  Occupational History  . Not on file  Tobacco Use  . Smoking status: Former Smoker    Years: 5.00  . Smokeless tobacco: Never Used  Substance and Sexual Activity  . Alcohol use: Yes    Comment: wine with dinner  . Drug use: No  . Sexual activity: Not on file  Other Topics Concern  . Not on file  Social History Narrative  . Not on file   Social Determinants of Health   Financial Resource Strain:   . Difficulty of Paying Living Expenses:   Food Insecurity:   . Worried About Charity fundraiser in the Last Year:   . Arboriculturist in the Last Year:   Transportation Needs:   . Film/video editor (Medical):   Marland Kitchen Lack of Transportation  (Non-Medical):   Physical Activity:   . Days of Exercise per Week:   . Minutes of Exercise per Session:   Stress:   . Feeling of Stress :   Social Connections:   . Frequency of Communication with Friends and Family:   . Frequency of Social Gatherings with Friends and Family:   . Attends Religious Services:   . Active Member of Clubs or Organizations:   . Attends Archivist Meetings:   Marland Kitchen Marital Status:      Family History: The patient's family history includes Diabetes in his mother.  ROS:   All other ROS reviewed and negative. Pertinent positives noted in the HPI.     EKGs/Labs/Other Studies Reviewed:   The following studies were personally reviewed by me today:  Recent EKG with left bundle branch block, QRS 154, atrial fibrillation  TTE 06/01/2019 1. Left ventricular ejection fraction, by estimation, is 30 to 35%. The  left ventricle has moderately decreased function. The left ventricle has  no regional wall motion abnormalities. The left ventricular internal  cavity size was mildly dilated. Left  ventricular diastolic parameters are consistent with Grade I diastolic  dysfunction (impaired relaxation). The average left ventricular global  longitudinal strain is -9.0 %.  2. Right ventricular systolic function is normal. The right ventricular  size is normal. There is moderately elevated pulmonary artery systolic  pressure. The estimated right ventricular systolic pressure is 29.9 mmHg.  3. Left atrial size was moderately dilated.  4. Right atrial size was moderately dilated.  5. The mitral valve is normal in structure. Moderate mitral valve  regurgitation. No evidence of mitral stenosis.  6. Tricuspid valve regurgitation is mild to moderate.  7. The aortic valve is tricuspid. Aortic valve regurgitation is not  visualized. Mild aortic valve sclerosis is present, with no evidence of  aortic valve stenosis.  8. The inferior vena cava is normal in size with  greater than 50%  respiratory variability, suggesting right atrial pressure of 3 mmHg.   Recent Labs: 06/02/2019: BNP 321.8; BUN 30; Creatinine, Ser 1.72; Potassium 4.5; Sodium 137; TSH 2.740   Recent Lipid Panel    Component Value Date/Time   CHOL 145 12/16/2012 1228   TRIG 219.0 (H) 12/16/2012 1228   HDL 32.20 (L) 12/16/2012 1228   CHOLHDL 5 12/16/2012 1228   VLDL 43.8 (H) 12/16/2012 1228   LDLCALC 57 02/13/2012 0909   LDLDIRECT 80.7 12/16/2012 1228    Physical Exam:  VS:  BP (!) 152/78   Pulse 81   Ht 5\' 11"  (1.803 m)   Wt 190 lb 3.2 oz (86.3 kg)   SpO2 95%   BMI 26.53 kg/m    Wt Readings from Last 3 Encounters:  06/21/19 190 lb 3.2 oz (86.3 kg)  05/31/19 191 lb 9.6 oz (86.9 kg)  12/16/12 206 lb (93.4 kg)    General: Well nourished, well developed, in no acute distress Heart: Atraumatic, normal size  Eyes: PEERLA, EOMI  Neck: Supple, no JVD, +HJR Endocrine: No thryomegaly Cardiac: Normal S1, S2; RRR; no murmurs, rubs, or gallops Lungs: Clear to auscultation bilaterally, no wheezing, rhonchi or rales  Abd: Soft, nontender, no hepatomegaly  Ext: No edema, pulses 2+ Musculoskeletal: No deformities, BUE and BLE strength normal and equal Skin: Warm and dry, no rashes   Neuro: Alert and oriented to person, place, time, and situation, CNII-XII grossly intact, no focal deficits  Psych: Normal mood and affect   ASSESSMENT:   Stanley Moore is a 86 y.o. male who presents for the following: 1. SOB (shortness of breath)   2. Persistent atrial fibrillation ([place])   3. Essential hypertension   4. Chronic systolic heart failure (HCC)     PLAN:   1. SOB (shortness of breath) 2. Persistent atrial fibrillation ([place]) 3. Essential hypertension 4. Chronic systolic heart failure (HCC) -EF 30-35% with new onset Afib.  Thyroid studies normal.  Suspect A. fib is secondary to his cardiomyopathy.  No prior history of CAD.  EKG shows likely left bundle branch block with QRS 154.   He does have significant dyssynchrony seen on his echocardiogram.  He has no regional wall motion abnormalities.  His cardiomyopathy could be A. fib related versus dyssynchrony related in setting of left bundle-like QRS.  We did switch him off atenolol and on Coreg 12.5 mg twice daily.  We will stop his losartan-HCTZ tablet and start him on losartan 100 mg daily.  We will start Lasix 20 mg daily.  He does have elevated RVSP.  He does have symptomatic heart failure.  Overall, he reports he is more interested in medical management versus aggressive interventions.  He also has CKD stage III.  We will need to be aware of this moving forward.  If his stress test is normal we may need to consider an electrophysiology evaluation for A. fib ablation as well as possible BiV pacemaker.  Given his prolonged QRS I suspect some of this could be related to left ventricular dyssynchrony. -Regarding his atrial fibrillation we will move forward with cardioversion.  He is on 2.5 mg twice daily of Eliquis.  Given his age over 41 and creatinine above 1.5 this is an appropriate dose.  He has had no missed doses in the last 3 weeks.  We will plan for Monday. -Given his CKD we have limited options for adding Aldactone.  I would not even pursue Entresto at this point.  We may be simply limited to losartan since he has been on this and tolerating this.   Disposition: Return in about 2 weeks (around 07/05/2019).  Medication Adjustments/Labs and Tests Ordered: Current medicines are reviewed at length with the patient today.  Concerns regarding medicines are outlined above.  Orders Placed This Encounter  Procedures  . CBC  . Basic metabolic panel  . MYOCARDIAL PERFUSION IMAGING   Meds ordered this encounter  Medications  . carvedilol (COREG) 12.5 MG tablet    Sig: Take 1 tablet (12.5 mg total) by  mouth 2 (two) times daily.    Dispense:  180 tablet    Refill:  3  . losartan (COZAAR) 100 MG tablet    Sig: Take 1 tablet (100  mg total) by mouth daily.    Dispense:  90 tablet    Refill:  3  . furosemide (LASIX) 20 MG tablet    Sig: Take 1 tablet (20 mg total) by mouth daily.    Dispense:  90 tablet    Refill:  3    Patient Instructions  Medication Instructions:  Stop Losartan-HCTZ  Start Losartan 100 mg daily  Start Lasix 20 mg daily   *If you need a refill on your cardiac medications before your next appointment, please call your pharmacy*   Lab Work: CBC, BMET today   COVID TESTING: 06/19- Saturday; at 12:00.  [facility] [employer]- drive thru.  If you have labs (blood work) drawn today and your tests are completely normal, you will receive your results only by: Marland Kitchen MyChart Message (if you have MyChart) OR . A paper copy in the mail If you have any lab test that is abnormal or we need to change your treatment, we will call you to review the results.   Testing/Procedures:  Your physician has requested that you have a lexiscan myoview. A cardiac stress test is a cardiological test that measures the heart's ability to respond to external stress in a controlled clinical environment. The stress response is induced by intravenous pharmacological stimulation.   Your physician has requested that you have a Cardioversion. Electrical Cardioversion uses a jolt of electricity to your heart either through paddles or wired patches attached to your chest. This is a controlled, usually prescheduled, procedure. This procedure is done at the hospital and you are not awake during the procedure. You usually go home the day of the procedure. Please see the instruction sheet given to you today for more information.    Follow-Up: At [facility], you and your health needs are our priority.  As part of our continuing mission to provide you with exceptional heart care, we have created designated Provider Care Teams.  These Care Teams include your primary Cardiologist (physician) and Advanced Practice Providers (APPs -   Physician Assistants and Nurse Practitioners) who all work together to provide you with the care you need, when you need it.  We recommend signing up for the patient portal called "MyChart".  Sign up information is provided on this After Visit Summary.  MyChart is used to connect with patients for Virtual Visits (Telemedicine).  Patients are able to view lab/test results, encounter notes, upcoming appointments, etc.  Non-urgent messages can be sent to your provider as well.   To learn more about what you can do with MyChart, go to [email or web address].    Your next appointment:   2 week(s)  The format for your next appointment:   In Person  Provider:   Eleonore Chiquito, MD   Other Instructions You are scheduled for a Cardioversion  on Monday June 21st with Dr. Margaretann Loveless.  Please arrive at the [facility] (Main Entrance A) at [facility]: [address] [place], [place] [address] at 8 am. (1 hour prior to procedure unless lab work is needed; if lab work is needed arrive 1.5 hours ahead)  DIET: Nothing to eat or drink after midnight except a sip of water with medications (see medication instructions below)  Medication Instructions:  Continue your anticoagulant: Eliquis  You will need to continue your anticoagulant  after your procedure until you  are told by your  Provider that it is safe to stop   Labs: CBC, BMET today   You must have a responsible person to drive you home and stay in the waiting area during your procedure. Failure to do so could result in cancellation.  Bring your insurance cards.  *Special Note: Every effort is made to have your procedure done on time. Occasionally there are emergencies that occur at the hospital that may cause delays. Please be patient if a delay does occur.         Time Spent with Patient: I have spent a total of 35 minutes with patient reviewing hospital notes, telemetry, EKGs, labs and examining the patient as well as establishing  an assessment and plan that was discussed with the patient.  > 50% of time was spent in direct patient care.  Signed, Addison Naegeli. Audie Box, [place]  [address], [place], [place] [address] [phone]  06/21/2019 10:13 AM

## 2019-06-19 NOTE — Progress Notes (Signed)
Cardiology Office Note:   Date:  06/21/2019  NAME:  Stanley Moore    MRN: [id number] DOB:  02-23-1931   PCP:  Patient, No Pcp Per  Cardiologist:  No primary care provider on file.   Referring MD: No ref. provider found   Chief Complaint  Patient presents with  . Congestive Heart Failure   History of Present Illness:   Stanley Moore is a 86 y.o. male with a hx of persistent Afib, DM, CKD, HTN who presents for follow-up. Evaluated 05/31/2019 for persistent Afib. Found to have new cardiomyopathy 30-35%.   He reports he is doing well.  He still gets short of breath when he does heavy exertion such as climbing a flight of stairs.  His echocardiogram shows an EF 30-35%.  He does have elevated RVSP.  I suspect he has symptomatic heart failure.  He denies any chest pain.  He is never had a heart attack or stroke.  He has been diabetic for about 4 years.  Most recent A1c was 6.8.  We did start him on carvedilol he is tolerating this well.  His blood pressure is 152/78.  He has no gross volume overload I suspect he has extra volume blood.  Weights are stable.  He does have CKD.  I did offer him a stress test versus cardiac cath.  He has opted for a stress test.  Regarding his A. fib he is on Eliquis 2.5 mg twice daily.  He is over 80 with a creatinine of 1.5.  This is the appropriate dose.  He said no missed doses in the past 3 weeks.  We will plan for cardioversion next week.  I will then see him back in 2 weeks after that.  He denies chest pain, shortness of breath or palpitations today.  He apparently does well with flat surfaces and inclines are an issue.  Problem List 1. HTN 2. HLD -T chol 114, LDL 72, TG 99, HDL 37 3. DM -A1c 6.8 4. CKD 3 -1.55 5. Persistent Afib 6. Systolic HF, 00-93%  Past Medical History: Past Medical History:  Diagnosis Date  . CHF (congestive heart failure) ([place])   . Heart murmur   . Hyperlipidemia   . Hypertension     Past Surgical  History: Past Surgical History:  Procedure Laterality Date  . appendicitis    . MOLE REMOVAL      Current Medications: Current Meds  Medication Sig  . apixaban (ELIQUIS) 2.5 MG TABS tablet Take 1 tablet (2.5 mg total) by mouth 2 (two) times daily.  . carvedilol (COREG) 12.5 MG tablet Take 1 tablet (12.5 mg total) by mouth 2 (two) times daily.  . clindamycin (CLEOCIN) 300 MG capsule Take 300 mg by mouth 3 (three) times daily.  Marland Kitchen GARLIC OIL PO Take by mouth.  . metFORMIN (GLUCOPHAGE) 500 MG tablet Take 1 tablet (500 mg total) by mouth 2 (two) times daily with a meal.  . NON FORMULARY Joint Softner  . OIL OF [place] PO Take by mouth.  . simvastatin (ZOCOR) 20 MG tablet Take 1 tablet (20 mg total) by mouth every evening.  . TURMERIC PO Take by mouth.  . [DISCONTINUED] carvedilol (COREG) 12.5 MG tablet Take 1 tablet (12.5 mg total) by mouth 2 (two) times daily.  . [DISCONTINUED] losartan-hydrochlorothiazide (HYZAAR) 100-25 MG per tablet Take 1 tablet by mouth daily.     Allergies:    Patient has no known allergies.   Social History: Social History  Socioeconomic History  . Marital status: Married    Spouse name: Not on file  . Number of children: 3  . Years of education: Not on file  . Highest education level: Not on file  Occupational History  . Not on file  Tobacco Use  . Smoking status: Former Smoker    Years: 5.00  . Smokeless tobacco: Never Used  Substance and Sexual Activity  . Alcohol use: Yes    Comment: wine with dinner  . Drug use: No  . Sexual activity: Not on file  Other Topics Concern  . Not on file  Social History Narrative  . Not on file   Social Determinants of Health   Financial Resource Strain:   . Difficulty of Paying Living Expenses:   Food Insecurity:   . Worried About Charity fundraiser in the Last Year:   . Arboriculturist in the Last Year:   Transportation Needs:   . Film/video editor (Medical):   Marland Kitchen Lack of Transportation  (Non-Medical):   Physical Activity:   . Days of Exercise per Week:   . Minutes of Exercise per Session:   Stress:   . Feeling of Stress :   Social Connections:   . Frequency of Communication with Friends and Family:   . Frequency of Social Gatherings with Friends and Family:   . Attends Religious Services:   . Active Member of Clubs or Organizations:   . Attends Archivist Meetings:   Marland Kitchen Marital Status:      Family History: The patient's family history includes Diabetes in his mother.  ROS:   All other ROS reviewed and negative. Pertinent positives noted in the HPI.     EKGs/Labs/Other Studies Reviewed:   The following studies were personally reviewed by me today:  Recent EKG with left bundle branch block, QRS 154, atrial fibrillation  TTE 06/01/2019 1. Left ventricular ejection fraction, by estimation, is 30 to 35%. The  left ventricle has moderately decreased function. The left ventricle has  no regional wall motion abnormalities. The left ventricular internal  cavity size was mildly dilated. Left  ventricular diastolic parameters are consistent with Grade I diastolic  dysfunction (impaired relaxation). The average left ventricular global  longitudinal strain is -9.0 %.  2. Right ventricular systolic function is normal. The right ventricular  size is normal. There is moderately elevated pulmonary artery systolic  pressure. The estimated right ventricular systolic pressure is 50.3 mmHg.  3. Left atrial size was moderately dilated.  4. Right atrial size was moderately dilated.  5. The mitral valve is normal in structure. Moderate mitral valve  regurgitation. No evidence of mitral stenosis.  6. Tricuspid valve regurgitation is mild to moderate.  7. The aortic valve is tricuspid. Aortic valve regurgitation is not  visualized. Mild aortic valve sclerosis is present, with no evidence of  aortic valve stenosis.  8. The inferior vena cava is normal in size with  greater than 50%  respiratory variability, suggesting right atrial pressure of 3 mmHg.   Recent Labs: 06/02/2019: BNP 321.8; BUN 30; Creatinine, Ser 1.72; Potassium 4.5; Sodium 137; TSH 2.740   Recent Lipid Panel    Component Value Date/Time   CHOL 145 12/16/2012 1228   TRIG 219.0 (H) 12/16/2012 1228   HDL 32.20 (L) 12/16/2012 1228   CHOLHDL 5 12/16/2012 1228   VLDL 43.8 (H) 12/16/2012 1228   LDLCALC 57 02/13/2012 0909   LDLDIRECT 80.7 12/16/2012 1228    Physical Exam:  VS:  BP (!) 152/78   Pulse 81   Ht 5\' 11"  (1.803 m)   Wt 190 lb 3.2 oz (86.3 kg)   SpO2 95%   BMI 26.53 kg/m    Wt Readings from Last 3 Encounters:  06/21/19 190 lb 3.2 oz (86.3 kg)  05/31/19 191 lb 9.6 oz (86.9 kg)  12/16/12 206 lb (93.4 kg)    General: Well nourished, well developed, in no acute distress Heart: Atraumatic, normal size  Eyes: PEERLA, EOMI  Neck: Supple, no JVD, +HJR Endocrine: No thryomegaly Cardiac: Normal S1, S2; RRR; no murmurs, rubs, or gallops Lungs: Clear to auscultation bilaterally, no wheezing, rhonchi or rales  Abd: Soft, nontender, no hepatomegaly  Ext: No edema, pulses 2+ Musculoskeletal: No deformities, BUE and BLE strength normal and equal Skin: Warm and dry, no rashes   Neuro: Alert and oriented to person, place, time, and situation, CNII-XII grossly intact, no focal deficits  Psych: Normal mood and affect   ASSESSMENT:   Stanley Moore is a 87 y.o. male who presents for the following: 1. SOB (shortness of breath)   2. Persistent atrial fibrillation ([place])   3. Essential hypertension   4. Chronic systolic heart failure (HCC)     PLAN:   1. SOB (shortness of breath) 2. Persistent atrial fibrillation ([place]) 3. Essential hypertension 4. Chronic systolic heart failure (HCC) -EF 30-35% with new onset Afib.  Thyroid studies normal.  Suspect A. fib is secondary to his cardiomyopathy.  No prior history of CAD.  EKG shows likely left bundle branch block with QRS 154.   He does have significant dyssynchrony seen on his echocardiogram.  He has no regional wall motion abnormalities.  His cardiomyopathy could be A. fib related versus dyssynchrony related in setting of left bundle-like QRS.  We did switch him off atenolol and on Coreg 12.5 mg twice daily.  We will stop his losartan-HCTZ tablet and start him on losartan 100 mg daily.  We will start Lasix 20 mg daily.  He does have elevated RVSP.  He does have symptomatic heart failure.  Overall, he reports he is more interested in medical management versus aggressive interventions.  He also has CKD stage III.  We will need to be aware of this moving forward.  If his stress test is normal we may need to consider an electrophysiology evaluation for A. fib ablation as well as possible BiV pacemaker.  Given his prolonged QRS I suspect some of this could be related to left ventricular dyssynchrony. -Regarding his atrial fibrillation we will move forward with cardioversion.  He is on 2.5 mg twice daily of Eliquis.  Given his age over 57 and creatinine above 1.5 this is an appropriate dose.  He has had no missed doses in the last 3 weeks.  We will plan for Monday. -Given his CKD we have limited options for adding Aldactone.  I would not even pursue Entresto at this point.  We may be simply limited to losartan since he has been on this and tolerating this.   Disposition: Return in about 2 weeks (around 07/05/2019).  Medication Adjustments/Labs and Tests Ordered: Current medicines are reviewed at length with the patient today.  Concerns regarding medicines are outlined above.  Orders Placed This Encounter  Procedures  . CBC  . Basic metabolic panel  . MYOCARDIAL PERFUSION IMAGING   Meds ordered this encounter  Medications  . carvedilol (COREG) 12.5 MG tablet    Sig: Take 1 tablet (12.5 mg total) by  mouth 2 (two) times daily.    Dispense:  180 tablet    Refill:  3  . losartan (COZAAR) 100 MG tablet    Sig: Take 1 tablet (100  mg total) by mouth daily.    Dispense:  90 tablet    Refill:  3  . furosemide (LASIX) 20 MG tablet    Sig: Take 1 tablet (20 mg total) by mouth daily.    Dispense:  90 tablet    Refill:  3    Patient Instructions  Medication Instructions:  Stop Losartan-HCTZ  Start Losartan 100 mg daily  Start Lasix 20 mg daily   *If you need a refill on your cardiac medications before your next appointment, please call your pharmacy*   Lab Work: CBC, BMET today   COVID TESTING: 06/19- Saturday; at 12:00.  [facility] [employer]- drive thru.  If you have labs (blood work) drawn today and your tests are completely normal, you will receive your results only by: Marland Kitchen MyChart Message (if you have MyChart) OR . A paper copy in the mail If you have any lab test that is abnormal or we need to change your treatment, we will call you to review the results.   Testing/Procedures:  Your physician has requested that you have a lexiscan myoview. A cardiac stress test is a cardiological test that measures the heart's ability to respond to external stress in a controlled clinical environment. The stress response is induced by intravenous pharmacological stimulation.   Your physician has requested that you have a Cardioversion. Electrical Cardioversion uses a jolt of electricity to your heart either through paddles or wired patches attached to your chest. This is a controlled, usually prescheduled, procedure. This procedure is done at the hospital and you are not awake during the procedure. You usually go home the day of the procedure. Please see the instruction sheet given to you today for more information.    Follow-Up: At [facility], you and your health needs are our priority.  As part of our continuing mission to provide you with exceptional heart care, we have created designated Provider Care Teams.  These Care Teams include your primary Cardiologist (physician) and Advanced Practice Providers (APPs -   Physician Assistants and Nurse Practitioners) who all work together to provide you with the care you need, when you need it.  We recommend signing up for the patient portal called "MyChart".  Sign up information is provided on this After Visit Summary.  MyChart is used to connect with patients for Virtual Visits (Telemedicine).  Patients are able to view lab/test results, encounter notes, upcoming appointments, etc.  Non-urgent messages can be sent to your provider as well.   To learn more about what you can do with MyChart, go to [email or web address].    Your next appointment:   2 week(s)  The format for your next appointment:   In Person  Provider:   Eleonore Chiquito, MD   Other Instructions You are scheduled for a Cardioversion  on Monday June 21st with Dr. Margaretann Loveless.  Please arrive at the [facility] (Main Entrance A) at [facility]: [address] [place], [place] [address] at 8 am. (1 hour prior to procedure unless lab work is needed; if lab work is needed arrive 1.5 hours ahead)  DIET: Nothing to eat or drink after midnight except a sip of water with medications (see medication instructions below)  Medication Instructions:  Continue your anticoagulant: Eliquis  You will need to continue your anticoagulant  after your procedure until you  are told by your  Provider that it is safe to stop   Labs: CBC, BMET today   You must have a responsible person to drive you home and stay in the waiting area during your procedure. Failure to do so could result in cancellation.  Bring your insurance cards.  *Special Note: Every effort is made to have your procedure done on time. Occasionally there are emergencies that occur at the hospital that may cause delays. Please be patient if a delay does occur.         Time Spent with Patient: I have spent a total of 35 minutes with patient reviewing hospital notes, telemetry, EKGs, labs and examining the patient as well as establishing  an assessment and plan that was discussed with the patient.  > 50% of time was spent in direct patient care.  Signed, Addison Naegeli. Audie Box, [place]  [address] [place], [place] [address] [phone]  06/21/2019 10:13 AM

## 2019-06-21 ENCOUNTER — Other Ambulatory Visit: Payer: Self-pay

## 2019-06-21 ENCOUNTER — Encounter: Payer: Self-pay | Admitting: Cardiovascular Disease

## 2019-06-21 ENCOUNTER — Ambulatory Visit: Payer: Medicare HMO | Admitting: Cardiovascular Disease

## 2019-06-21 VITALS — BP 152/78 | HR 81 | Ht 71.0 in | Wt 190.2 lb

## 2019-06-21 DIAGNOSIS — R0602 Shortness of breath: Secondary | ICD-10-CM

## 2019-06-21 DIAGNOSIS — I4819 Other persistent atrial fibrillation: Secondary | ICD-10-CM

## 2019-06-21 DIAGNOSIS — I5022 Chronic systolic (congestive) heart failure: Secondary | ICD-10-CM

## 2019-06-21 DIAGNOSIS — I10 Essential (primary) hypertension: Secondary | ICD-10-CM

## 2019-06-21 LAB — BASIC METABOLIC PANEL
BUN/Creatinine Ratio: 17 (ref 10–24)
BUN: 26 mg/dL (ref 8–27)
CO2: 24 mmol/L (ref 20–29)
Calcium: 8.9 mg/dL (ref 8.6–10.2)
Chloride: 99 mmol/L (ref 96–106)
Creatinine, Ser: 1.49 mg/dL — ABNORMAL HIGH (ref 0.76–1.27)
GFR calc Af Amer: 49 mL/min/{1.73_m2} — ABNORMAL LOW (ref >59)
GFR calc non Af Amer: 42 mL/min/{1.73_m2} — ABNORMAL LOW (ref >59)
Glucose: 163 mg/dL — ABNORMAL HIGH (ref 65–99)
Potassium: 4.6 mmol/L (ref 3.5–5.2)
Sodium: 136 mmol/L (ref 134–144)

## 2019-06-21 LAB — CBC
Hematocrit: 34.4 % — ABNORMAL LOW (ref 37.5–51.0)
Hemoglobin: 11.6 g/dL — ABNORMAL LOW (ref 13.0–17.7)
MCH: 35.3 pg — ABNORMAL HIGH (ref 26.6–33.0)
MCHC: 33.7 g/dL (ref 31.5–35.7)
MCV: 105 fL — ABNORMAL HIGH (ref 79–97)
Platelets: 166 10*3/uL (ref 150–450)
RBC: 3.29 x10E6/uL — ABNORMAL LOW (ref 4.14–5.80)
RDW: 13.1 % (ref 11.6–15.4)
WBC: 9 10*3/uL (ref 3.4–10.8)

## 2019-06-21 MED ORDER — LOSARTAN POTASSIUM 100 MG PO TABS
100.0000 mg | ORAL_TABLET | Freq: Every day | ORAL | 3 refills | Status: DC
Start: 2019-06-21 — End: 2019-07-15

## 2019-06-21 MED ORDER — FUROSEMIDE 20 MG PO TABS
20.0000 mg | ORAL_TABLET | Freq: Every day | ORAL | 3 refills | Status: DC
Start: 2019-06-21 — End: 2019-07-15

## 2019-06-21 MED ORDER — CARVEDILOL 12.5 MG PO TABS: 12.5000 mg | ORAL_TABLET | Freq: Two times a day (BID) | ORAL | 3 refills | Status: DC

## 2019-06-21 NOTE — Patient Instructions (Addendum)
Medication Instructions:  Stop Losartan-HCTZ  Start Losartan 100 mg daily  Start Lasix 20 mg daily   *If you need a refill on your cardiac medications before your next appointment, please call your pharmacy*   Lab Work: CBC, BMET today   COVID TESTING: 06/19- Saturday; at 12:00.  [facility] [employer]- drive thru.  If you have labs (blood work) drawn today and your tests are completely normal, you will receive your results only by: Marland Kitchen MyChart Message (if you have MyChart) OR . A paper copy in the mail If you have any lab test that is abnormal or we need to change your treatment, we will call you to review the results.   Testing/Procedures:  Your physician has requested that you have a lexiscan myoview. A cardiac stress test is a cardiological test that measures the heart's ability to respond to external stress in a controlled clinical environment. The stress response is induced by intravenous pharmacological stimulation.   Your physician has requested that you have a Cardioversion. Electrical Cardioversion uses a jolt of electricity to your heart either through paddles or wired patches attached to your chest. This is a controlled, usually prescheduled, procedure. This procedure is done at the hospital and you are not awake during the procedure. You usually go home the day of the procedure. Please see the instruction sheet given to you today for more information.    Follow-Up: At [facility], you and your health needs are our priority.  As part of our continuing mission to provide you with exceptional heart care, we have created designated Provider Care Teams.  These Care Teams include your primary Cardiologist (physician) and Advanced Practice Providers (APPs -  Physician Assistants and Nurse Practitioners) who all work together to provide you with the care you need, when you need it.  We recommend signing up for the patient portal called "MyChart".  Sign up information is provided on  this After Visit Summary.  MyChart is used to connect with patients for Virtual Visits (Telemedicine).  Patients are able to view lab/test results, encounter notes, upcoming appointments, etc.  Non-urgent messages can be sent to your provider as well.   To learn more about what you can do with MyChart, go to [email or web address].    Your next appointment:   2 week(s)  The format for your next appointment:   In Person  Provider:   Eleonore Chiquito, MD   Other Instructions You are scheduled for a Cardioversion  on Monday June 21st with Dr. Margaretann Loveless.  Please arrive at the [facility] (Main Entrance A) at [facility]: [address] [place], [place] [address] at 8 am. (1 hour prior to procedure unless lab work is needed; if lab work is needed arrive 1.5 hours ahead)  DIET: Nothing to eat or drink after midnight except a sip of water with medications (see medication instructions below)  Medication Instructions:  Continue your anticoagulant: Eliquis  You will need to continue your anticoagulant after your procedure until you  are told by your  Provider that it is safe to stop   Labs: CBC, BMET today   You must have a responsible person to drive you home and stay in the waiting area during your procedure. Failure to do so could result in cancellation.  Bring your insurance cards.  *Special Note: Every effort is made to have your procedure done on time. Occasionally there are emergencies that occur at the hospital that may cause delays. Please be patient if a delay  does occur.

## 2019-06-25 ENCOUNTER — Other Ambulatory Visit (HOSPITAL_COMMUNITY)
Admission: RE | Admit: 2019-06-25 | Discharge: 2019-06-25 | Disposition: A | Discharge status: Home / Self Care | Payer: Medicare HMO | Source: Ambulatory Visit | Attending: Internal Medicine | Admitting: Internal Medicine

## 2019-06-25 DIAGNOSIS — Z01812 Encounter for preprocedural laboratory examination: Secondary | ICD-10-CM | POA: Diagnosis present

## 2019-06-25 DIAGNOSIS — Z20822 Contact with and (suspected) exposure to covid-19: Secondary | ICD-10-CM | POA: Insufficient documentation

## 2019-06-25 LAB — SARS CORONAVIRUS 2 (TAT 6-24 HRS): SARS Coronavirus 2: NEGATIVE

## 2019-06-27 ENCOUNTER — Other Ambulatory Visit: Payer: Self-pay

## 2019-06-27 ENCOUNTER — Encounter (HOSPITAL_COMMUNITY): Payer: Self-pay | Admitting: Internal Medicine

## 2019-06-27 ENCOUNTER — Ambulatory Visit (HOSPITAL_COMMUNITY): Payer: Medicare HMO | Admitting: Anesthesiology

## 2019-06-27 ENCOUNTER — Encounter (HOSPITAL_COMMUNITY)
Admission: RE | Disposition: A | Discharge status: Home / Self Care | Payer: Self-pay | Source: Home / Self Care | Attending: Internal Medicine

## 2019-06-27 ENCOUNTER — Ambulatory Visit (HOSPITAL_COMMUNITY)
Admission: RE | Admit: 2019-06-27 | Discharge: 2019-06-27 | Disposition: A | Discharge status: Home / Self Care | Payer: Medicare HMO | Attending: Internal Medicine | Admitting: Internal Medicine

## 2019-06-27 DIAGNOSIS — Z7901 Long term (current) use of anticoagulants: Secondary | ICD-10-CM | POA: Insufficient documentation

## 2019-06-27 DIAGNOSIS — Z7984 Long term (current) use of oral hypoglycemic drugs: Secondary | ICD-10-CM | POA: Diagnosis not present

## 2019-06-27 DIAGNOSIS — Z87891 Personal history of nicotine dependence: Secondary | ICD-10-CM | POA: Insufficient documentation

## 2019-06-27 DIAGNOSIS — I4819 Other persistent atrial fibrillation: Secondary | ICD-10-CM | POA: Diagnosis present

## 2019-06-27 DIAGNOSIS — E1122 Type 2 diabetes mellitus with diabetic chronic kidney disease: Secondary | ICD-10-CM | POA: Diagnosis not present

## 2019-06-27 DIAGNOSIS — Z833 Family history of diabetes mellitus: Secondary | ICD-10-CM | POA: Diagnosis not present

## 2019-06-27 DIAGNOSIS — I5022 Chronic systolic (congestive) heart failure: Secondary | ICD-10-CM | POA: Diagnosis not present

## 2019-06-27 DIAGNOSIS — I130 Hypertensive heart and chronic kidney disease with heart failure and stage 1 through stage 4 chronic kidney disease, or unspecified chronic kidney disease: Secondary | ICD-10-CM | POA: Diagnosis not present

## 2019-06-27 DIAGNOSIS — I429 Cardiomyopathy, unspecified: Secondary | ICD-10-CM | POA: Diagnosis not present

## 2019-06-27 DIAGNOSIS — E785 Hyperlipidemia, unspecified: Secondary | ICD-10-CM | POA: Insufficient documentation

## 2019-06-27 DIAGNOSIS — N183 Chronic kidney disease, stage 3 unspecified: Secondary | ICD-10-CM | POA: Diagnosis not present

## 2019-06-27 DIAGNOSIS — R0602 Shortness of breath: Secondary | ICD-10-CM | POA: Diagnosis not present

## 2019-06-27 DIAGNOSIS — Z79899 Other long term (current) drug therapy: Secondary | ICD-10-CM | POA: Insufficient documentation

## 2019-06-27 HISTORY — DX: Type 2 diabetes mellitus without complications: E11.9

## 2019-06-27 HISTORY — PX: CARDIOVERSION: SHX1299

## 2019-06-27 LAB — GLUCOSE, CAPILLARY: Glucose-Capillary: 175 mg/dL — ABNORMAL HIGH (ref 70–99)

## 2019-06-27 SURGERY — CARDIOVERSION
Anesthesia: General

## 2019-06-27 MED ORDER — SODIUM CHLORIDE 0.9 % IV SOLN
INTRAVENOUS | Status: AC | PRN
  Administered 2019-06-27: 500 mL via INTRAMUSCULAR

## 2019-06-27 MED ORDER — PROPOFOL 10 MG/ML IV BOLUS
INTRAVENOUS | Status: DC | PRN
  Administered 2019-06-27: 10 mg via INTRAVENOUS
  Administered 2019-06-27: 50 mg via INTRAVENOUS
  Administered 2019-06-27: 20 mg via INTRAVENOUS

## 2019-06-27 MED ORDER — PHENYLEPHRINE HCL (PRESSORS) 10 MG/ML IV SOLN
INTRAVENOUS | Status: DC | PRN
Start: 2019-06-27 — End: 2019-06-27
  Administered 2019-06-27: 80 ug via INTRAVENOUS

## 2019-06-27 NOTE — Discharge Instructions (Signed)
Electrical Cardioversion Electrical cardioversion is the delivery of a jolt of electricity to restore a normal rhythm to the heart. A rhythm that is too fast or is not regular keeps the heart from pumping well. In this procedure, sticky patches or metal paddles are placed on the chest to deliver electricity to the heart from a device. This procedure may be done in an emergency if:  There is low or no blood pressure as a result of the heart rhythm.  Normal rhythm must be restored as fast as possible to protect the brain and heart from further damage.  It may save a life. This may also be a scheduled procedure for irregular or fast heart rhythms that are not immediately life-threatening. Tell a health care provider about:  Any allergies you have.  All medicines you are taking, including vitamins, herbs, eye drops, creams, and over-the-counter medicines.  Any problems you or family members have had with anesthetic medicines.  Any blood disorders you have.  Any surgeries you have had.  Any medical conditions you have.  Whether you are pregnant or may be pregnant. What are the risks? Generally, this is a safe procedure. However, problems may occur, including:  Allergic reactions to medicines.  A blood clot that breaks free and travels to other parts of your body.  The possible return of an abnormal heart rhythm within hours or days after the procedure.  Your heart stopping (cardiac arrest). This is rare. What happens before the procedure? Medicines  Your health care provider may have you start taking: ? Blood-thinning medicines (anticoagulants) so your blood does not clot as easily. ? Medicines to help stabilize your heart rate and rhythm.  Ask your health care provider about: ? Changing or stopping your regular medicines. This is especially important if you are taking diabetes medicines or blood thinners. ? Taking medicines such as aspirin and ibuprofen. These medicines can  thin your blood. Do not take these medicines unless your health care provider tells you to take them. ? Taking over-the-counter medicines, vitamins, herbs, and supplements. General instructions  Follow instructions from your health care provider about eating or drinking restrictions.  Plan to have someone take you home from the hospital or clinic.  If you will be going home right after the procedure, plan to have someone with you for 24 hours.  Ask your health care provider what steps will be taken to help prevent infection. These may include washing your skin with a germ-killing soap. What happens during the procedure?   An IV will be inserted into one of your veins.  Sticky patches (electrodes) or metal paddles may be placed on your chest.  You will be given a medicine to help you relax (sedative).  An electrical shock will be delivered. The procedure may vary among health care providers and hospitals. What can I expect after the procedure?  Your blood pressure, heart rate, breathing rate, and blood oxygen level will be monitored until you leave the hospital or clinic.  Your heart rhythm will be watched to make sure it does not change.  You may have some redness on the skin where the shocks were given. Follow these instructions at home:  Do not drive for 24 hours if you were given a sedative during your procedure.  Take over-the-counter and prescription medicines only as told by your health care provider.  Ask your health care provider how to check your pulse. Check it often.  Rest for 48 hours after the procedure or   as told by your health care provider.  Avoid or limit your caffeine use as told by your health care provider.  Keep all follow-up visits as told by your health care provider. This is important. Contact a health care provider if:  You feel like your heart is beating too quickly or your pulse is not regular.  You have a serious muscle cramp that does not go  away. Get help right away if:  You have discomfort in your chest.  You are dizzy or you feel faint.  You have trouble breathing or you are short of breath.  Your speech is slurred.  You have trouble moving an arm or leg on one side of your body.  Your fingers or toes turn cold or blue. Summary  Electrical cardioversion is the delivery of a jolt of electricity to restore a normal rhythm to the heart.  This procedure may be done right away in an emergency or may be a scheduled procedure if the condition is not an emergency.  Generally, this is a safe procedure.  After the procedure, check your pulse often as told by your health care provider. This information is not intended to replace advice given to you by your health care provider. Make sure you discuss any questions you have with your health care provider. Document Revised: 07/26/2018 Document Reviewed: 07/26/2018 Elsevier Patient Education  2020 [employer].  

## 2019-06-27 NOTE — Transfer of Care (Signed)
Immediate Anesthesia Transfer of Care Note  Patient: Stanley Moore  Procedure(s) Performed: CARDIOVERSION (N/A )  Patient Location: Endoscopy Unit  Anesthesia Type:MAC  Level of Consciousness: awake, alert  and oriented  Airway & Oxygen Therapy: Patient connected to face mask oxygen  Post-op Assessment: Post -op Vital signs reviewed and stable  Post vital signs: stable  Last Vitals:  Vitals Value Taken Time  BP    Temp    Pulse    Resp    SpO2      Last Pain:  Vitals:   06/27/19 0803  TempSrc: Oral         Complications: No complications documented.

## 2019-06-27 NOTE — Anesthesia Procedure Notes (Signed)
Date/Time: 06/27/2019 9:07 AM Performed by: Lavell Luster, CRNA Patient Re-evaluated:Patient Re-evaluated prior to induction Oxygen Delivery Method: Ambu bag Preoxygenation: Pre-oxygenation with 100% oxygen Induction Type: IV induction Ventilation: Mask ventilation without difficulty

## 2019-06-27 NOTE — Anesthesia Preprocedure Evaluation (Addendum)
Anesthesia Evaluation  Patient identified by MRN, date of birth, ID band Patient awake    Reviewed: Allergy & Precautions, NPO status , Patient's Chart, lab work & pertinent test results  Airway Mallampati: III  TM Distance: >3 FB Neck ROM: Full    Dental no notable dental hx.    Pulmonary former smoker,    Pulmonary exam normal breath sounds clear to auscultation       Cardiovascular hypertension, Pt. on medications +CHF   Rhythm:Irregular Rate:Normal  ECG: a-fib, rate 82  ECHO: 1. Left ventricular ejection fraction, by estimation, is 30 to 35%. The left ventricle has moderately decreased function. The left ventricle has no regional wall motion abnormalities. The left ventricular internal cavity size was mildly dilated. Left ventricular diastolic parameters are consistent with Grade I diastolic dysfunction (impaired relaxation). The average left ventricular global longitudinal strain is -9.0 %. 2. Right ventricular systolic function is normal. The right ventricular size is normal. There is moderately elevated pulmonary artery systolic pressure. The estimated right ventricular systolic pressure is 77.1 mmHg. 3. Left atrial size was moderately dilated. 4. Right atrial size was moderately dilated. 5. The mitral valve is normal in structure. Moderate mitral valve regurgitation. No evidence of mitral stenosis. 6. Tricuspid valve regurgitation is mild to moderate. 7. The aortic valve is tricuspid. Aortic valve regurgitation is not visualized. Mild aortic valve sclerosis is present, with no evidence of aortic valve stenosis. 8. The inferior vena cava is normal in size with greater than 50% respiratory variability, suggesting right atrial pressure of 3 mmHg.   Neuro/Psych negative neurological ROS  negative psych ROS   GI/Hepatic negative GI ROS, Neg liver ROS,   Endo/Other  diabetes, Oral Hypoglycemic Agents   Renal/GU negative Renal ROS     Musculoskeletal negative musculoskeletal ROS (+)   Abdominal   Peds  Hematology  (+) anemia , HLD   Anesthesia Other Findings A-FIB  Reproductive/Obstetrics                            Anesthesia Physical Anesthesia Plan  ASA: III  Anesthesia Plan: General   Post-op Pain Management:    Induction: Intravenous  PONV Risk Score and Plan: 2 and Propofol infusion and Treatment may vary due to age or medical condition  Airway Management Planned: Mask  Additional Equipment:   Intra-op Plan:   Post-operative Plan:   Informed Consent: I have reviewed the patients History and Physical, chart, labs and discussed the procedure including the risks, benefits and alternatives for the proposed anesthesia with the patient or authorized representative who has indicated his/her understanding and acceptance.     Dental advisory given  Plan Discussed with: CRNA  Anesthesia Plan Comments:         Anesthesia Quick Evaluation

## 2019-06-27 NOTE — Interval H&P Note (Signed)
History and Physical Interval Note:  06/27/2019 8:48 AM  Stanley Moore  has presented today for surgery, with the diagnosis of AFIB.  The various methods of treatment have been discussed with the patient and family. After consideration of risks, benefits and other options for treatment, the patient has consented to  Procedure(s): CARDIOVERSION (N/A) as a surgical intervention.  The patient's history has been reviewed, patient examined, no change in status, stable for surgery.  I have reviewed the patient's chart and labs.  Questions were answered to the patient's satisfaction.    AC: no missed doses of Eliquis 2.5 mg BID. Echocardiogram reviewed.   Elouise Munroe

## 2019-06-27 NOTE — Anesthesia Postprocedure Evaluation (Signed)
Anesthesia Post Note  Patient: Stanley Moore  Procedure(s) Performed: CARDIOVERSION (N/A )     Patient location during evaluation: Endoscopy Anesthesia Type: General Level of consciousness: awake Pain management: pain level controlled Vital Signs Assessment: post-procedure vital signs reviewed and stable Respiratory status: spontaneous breathing, nonlabored ventilation, respiratory function stable and patient connected to nasal cannula oxygen Cardiovascular status: blood pressure returned to baseline and stable Postop Assessment: no apparent nausea or vomiting Anesthetic complications: no   No complications documented.  Last Vitals:  Vitals:   06/27/19 0930 06/27/19 0936  BP: (!) 131/44 (!) 121/55  Pulse: 63 70  Resp: (!) 21 20  Temp:    SpO2: 96% 98%    Last Pain:  Vitals:   06/27/19 0936  TempSrc:   PainSc: 0-No pain                 Hrithik Boschee P Kynzee Devinney

## 2019-06-27 NOTE — CV Procedure (Signed)
Procedure: Electrical Cardioversion Indications:  Atrial Fibrillation  Procedure Details:  Consent: Risks of procedure as well as the alternatives and risks of each were explained to the (patient/caregiver).  Consent for procedure obtained.  Time Out: Verified patient identification, verified procedure, site/side was marked, verified correct patient position, special equipment/implants available, medications/allergies/relevent history reviewed, required imaging and test results available. PERFORMED.  Patient placed on cardiac monitor, pulse oximetry, supplemental oxygen as necessary.  Sedation given: propofol per anesthesia Morey Hummingbird CRNA/Dr. Roanna Banning) Pacer pads placed anterior and posterior chest.  Cardioverted 2 time(s).  Cardioversion with synchronized biphasic 120J, 200J shock.  Evaluation: Findings: Post procedure EKG shows: sinus bradycardia with PVCs Complications: None Patient did tolerate procedure well.  Time Spent Directly with the Patient:  30 minutes   Stanley Moore 06/27/2019, 9:19 AM

## 2019-06-28 ENCOUNTER — Telehealth (HOSPITAL_COMMUNITY): Payer: Self-pay

## 2019-06-28 NOTE — Telephone Encounter (Signed)
Encounter complete. 

## 2019-06-30 ENCOUNTER — Ambulatory Visit (HOSPITAL_COMMUNITY)
Admission: RE | Admit: 2019-06-30 | Discharge: 2019-06-30 | Disposition: A | Discharge status: Home / Self Care | Payer: Medicare HMO | Source: Ambulatory Visit | Attending: Internal Medicine | Admitting: Internal Medicine

## 2019-06-30 ENCOUNTER — Other Ambulatory Visit: Payer: Self-pay

## 2019-06-30 DIAGNOSIS — I5022 Chronic systolic (congestive) heart failure: Secondary | ICD-10-CM

## 2019-06-30 LAB — MYOCARDIAL PERFUSION IMAGING
Peak HR: 82 {beats}/min
Rest HR: 62 {beats}/min
SRS: 1
SSS: 4
TID: 1.01

## 2019-06-30 MED ORDER — TECHNETIUM TC 99M TETROFOSMIN IV KIT
31.5000 | PACK | Freq: Once | INTRAVENOUS | Status: AC | PRN
  Administered 2019-06-30: 31.5 via INTRAVENOUS
  Filled 2019-06-30: qty 32

## 2019-06-30 MED ORDER — TECHNETIUM TC 99M TETROFOSMIN IV KIT
10.4000 | PACK | Freq: Once | INTRAVENOUS | Status: AC | PRN
  Administered 2019-06-30: 10.4 via INTRAVENOUS
  Filled 2019-06-30: qty 11

## 2019-06-30 MED ORDER — REGADENOSON 0.4 MG/5ML IV SOLN
0.4000 mg | Freq: Once | INTRAVENOUS | Status: AC
Start: 2019-06-30 — End: 2019-06-30
  Administered 2019-06-30: 0.4 mg via INTRAVENOUS

## 2019-07-12 NOTE — Progress Notes (Signed)
Cardiology Office Note:   Date:  07/15/2019  NAME:  Stanley Moore    MRN: [id number] DOB:  August 05, 1931   PCP:  Patient, No Pcp Per  Cardiologist:  No primary care provider on file.   Referring MD: No ref. provider found   Chief Complaint  Patient presents with  . Atrial Fibrillation   History of Present Illness:   Stanley Moore is a 86 y.o. male with a hx of CHF, Afib, DM, HTN who presents for follow-up. Recent cardioversion with return to NSR. NM stress negative. Possibly this is LBBB related.  He presents today normal sinus rhythm.  He reports he is feeling better.  He is more energetic.  His wife presents with him as well today.  She reports that he is able to walk around the grocery store and not get as tired.  He reports that he is just felt much better since his cardioversion.  It is reassuring that he is maintaining sinus rhythm.  He does have some PVCs noted.  He is on Lasix and hydrochlorothiazide.  We need to stop his Lasix.  I did discuss amiodarone with him as this will be a good agent to keep him in normal rhythm.  He is in agreement with this.  We will need a chest x-ray prior to starting this just to make sure there is no underlying lung disease.  This will also serve as his baseline.  Thyroid studies were normal recently.  He denies chest pain, shortness of breath, palpitations, lower extremity edema today.  Seems to be tolerating heart failure medications very well.   Problem List 1. HTN 2. HLD -T chol 114, LDL 72, TG 99, HDL 37 3. DM -A1c 6.8 4. CKD 3 -1.55 5. Persistent Afib -DCCV 06/27/2019 6. Systolic HF, 02-54% -normal MPI 7. 1AVB -378 msec 8. LBBB  Past Medical History: Past Medical History:  Diagnosis Date  . CHF (congestive heart failure) ([place])   . Diabetes mellitus without complication ([place])   . Heart murmur   . Hyperlipidemia   . Hypertension     Past Surgical History: Past Surgical History:  Procedure Laterality Date  .  appendicitis    . CARDIOVERSION N/A 06/27/2019   Procedure: CARDIOVERSION;  Surgeon: Elouise Munroe, MD;  Location: [facility] ENDOSCOPY;  Service: Cardiovascular;  Laterality: N/A;  . MOLE REMOVAL      Current Medications: Current Meds  Medication Sig  . apixaban (ELIQUIS) 2.5 MG TABS tablet Take 1 tablet (2.5 mg total) by mouth 2 (two) times daily.  . carvedilol (COREG) 12.5 MG tablet Take 1 tablet (12.5 mg total) by mouth 2 (two) times daily.  Gretta Arab 450 MG CAPS Take 450 mg by mouth daily.  . Cinnamon 500 MG capsule Take 1,500 mg by mouth daily.  . clindamycin (CLEOCIN) 300 MG capsule Take 300 mg by mouth 3 (three) times daily.  . furosemide (LASIX) 20 MG tablet Take 1 tablet (20 mg total) by mouth daily.  . Garlic Oil 2706 MG CAPS Take 1,000 mg by mouth daily.   Marland Kitchen losartan (COZAAR) 100 MG tablet Take 1 tablet (100 mg total) by mouth daily.  . Multiple Vitamins-Minerals (MULTIVITAMIN WITH MINERALS) tablet Take 1 tablet by mouth daily. Iron  . Oil of Oregano 1500 MG CAPS Take 1,500 mg by mouth daily.  Marland Kitchen OVER THE COUNTER MEDICATION Take 1 capsule by mouth daily. Rose Mary leaf  . simvastatin (ZOCOR) 20 MG tablet Take 1 tablet (20 mg total) by  mouth every evening.  . Turmeric 500 MG TABS Take 500 mg by mouth daily.   Marland Kitchen Ubiquinol 100 MG CAPS Take 100 mg by mouth daily.  . Vitamin D3 (VITAMIN D) 25 MCG tablet Take 1,000 Units by mouth daily.  . [DISCONTINUED] apixaban (ELIQUIS) 2.5 MG TABS tablet Take 1 tablet (2.5 mg total) by mouth 2 (two) times daily.  . [DISCONTINUED] carvedilol (COREG) 12.5 MG tablet Take 1 tablet (12.5 mg total) by mouth 2 (two) times daily.  . [DISCONTINUED] furosemide (LASIX) 20 MG tablet Take 1 tablet (20 mg total) by mouth daily.  . [DISCONTINUED] hydrochlorothiazide (HYDRODIURIL) 25 MG tablet Take 25 mg by mouth daily.  . [DISCONTINUED] losartan (COZAAR) 100 MG tablet Take 1 tablet (100 mg total) by mouth daily.     Allergies:    Patient has no known allergies.    Social History: Social History   Socioeconomic History  . Marital status: Married    Spouse name: Not on file  . Number of children: 3  . Years of education: Not on file  . Highest education level: Not on file  Occupational History  . Not on file  Tobacco Use  . Smoking status: Former Smoker    Years: 5.00  . Smokeless tobacco: Never Used  Substance and Sexual Activity  . Alcohol use: Yes    Comment: wine with dinner  . Drug use: No  . Sexual activity: Not on file  Other Topics Concern  . Not on file  Social History Narrative  . Not on file   Social Determinants of Health   Financial Resource Strain:   . Difficulty of Paying Living Expenses:   Food Insecurity:   . Worried About Charity fundraiser in the Last Year:   . Arboriculturist in the Last Year:   Transportation Needs:   . Film/video editor (Medical):   Marland Kitchen Lack of Transportation (Non-Medical):   Physical Activity:   . Days of Exercise per Week:   . Minutes of Exercise per Session:   Stress:   . Feeling of Stress :   Social Connections:   . Frequency of Communication with Friends and Family:   . Frequency of Social Gatherings with Friends and Family:   . Attends Religious Services:   . Active Member of Clubs or Organizations:   . Attends Archivist Meetings:   Marland Kitchen Marital Status:      Family History: The patient's family history includes Diabetes in his mother.  ROS:   All other ROS reviewed and negative. Pertinent positives noted in the HPI.     EKGs/Labs/Other Studies Reviewed:   The following studies were personally reviewed by me today:  EKG:  EKG is ordered today.  The ekg ordered today demonstrates sinus bradycardia, heart rate 54, first-degree AV block 328 ms, frequent PVCs, and was personally reviewed by me.   TTE 06/01/2019  1. Left ventricular ejection fraction, by estimation, is 30 to 35%. The  left ventricle has moderately decreased function. The left ventricle has  no  regional wall motion abnormalities. The left ventricular internal  cavity size was mildly dilated. Left  ventricular diastolic parameters are consistent with Grade I diastolic  dysfunction (impaired relaxation). The average left ventricular global  longitudinal strain is -9.0 %.  2. Right ventricular systolic function is normal. The right ventricular  size is normal. There is moderately elevated pulmonary artery systolic  pressure. The estimated right ventricular systolic pressure is 28.3 mmHg.  3. Left atrial size was moderately dilated.  4. Right atrial size was moderately dilated.  5. The mitral valve is normal in structure. Moderate mitral valve  regurgitation. No evidence of mitral stenosis.  6. Tricuspid valve regurgitation is mild to moderate.  7. The aortic valve is tricuspid. Aortic valve regurgitation is not  visualized. Mild aortic valve sclerosis is present, with no evidence of  aortic valve stenosis.  8. The inferior vena cava is normal in size with greater than 50%  respiratory variability, suggesting right atrial pressure of 3 mmHg.   NM Stress 06/30/2019 -normal    Recent Labs: 06/02/2019: BNP 321.8; TSH 2.740 06/21/2019: BUN 26; Creatinine, Ser 1.49; Hemoglobin 11.6; Platelets 166; Potassium 4.6; Sodium 136   Recent Lipid Panel    Component Value Date/Time   CHOL 145 12/16/2012 1228   TRIG 219.0 (H) 12/16/2012 1228   HDL 32.20 (L) 12/16/2012 1228   CHOLHDL 5 12/16/2012 1228   VLDL 43.8 (H) 12/16/2012 1228   LDLCALC 57 02/13/2012 0909   LDLDIRECT 80.7 12/16/2012 1228    Physical Exam:   VS:  BP (!) 109/49   Pulse 60   Ht 5\' 11"  (1.803 m)   Wt 191 lb (86.6 kg)   SpO2 98%   BMI 26.64 kg/m    Wt Readings from Last 3 Encounters:  07/15/19 191 lb (86.6 kg)  06/30/19 190 lb (86.2 kg)  06/27/19 184 lb (83.5 kg)    General: Well nourished, well developed, in no acute distress Heart: Atraumatic, normal size  Eyes: PEERLA, EOMI  Neck: Supple, no  JVD Endocrine: No thryomegaly Cardiac: Normal S1, S2; RRR; no murmurs, rubs, or gallops Lungs: Clear to auscultation bilaterally, no wheezing, rhonchi or rales  Abd: Soft, nontender, no hepatomegaly  Ext: No edema, pulses 2+ Musculoskeletal: No deformities, BUE and BLE strength normal and equal Skin: Warm and dry, no rashes   Neuro: Alert and oriented to person, place, time, and situation, CNII-XII grossly intact, no focal deficits  Psych: Normal mood and affect   ASSESSMENT:   CASSIE HENKELS is a 86 y.o. male who presents for the following: 1. SOB (shortness of breath)   2. Chronic systolic heart failure ([place])   3. LBBB (left bundle branch block)   4. Persistent atrial fibrillation ([place])   5. First degree AV block     PLAN:   1. SOB (shortness of breath) 2. Chronic systolic heart failure ([place]) 3. LBBB (left bundle branch block) -Found to have newly diagnosed systolic heart failure.  Nuclear medicine stress test is normal.  He was in atrial fibrillation we pursue cardioversion.  Possibly A. fib is the etiology here.  This could also be related to a chronic left bundle branch block.  His QRS is chronically around 160.  This could have possibly led to LV dysfunction.  Nonetheless, he does deserve a trial of medical therapy.  We will go ahead and continue Coreg 12.5 mg twice daily, losartan 100 mg daily, Lasix 20 mg daily.  He will stop his hydrochlorothiazide.  Does not need to be on 2 diuretics.  I have plan start amiodarone below. -Diagnosed June 2021.  We will plan to see him back in about 5 months this will be 6 months from his diagnosis and we will obtain an echocardiogram before that time.  4. Persistent atrial fibrillation [facility]) -Status post cardioversion June 2021.  In normal sinus rhythm today.  Given his low EF I do have concerns about him maintaining sinus  rhythm.  I recommended amiodarone 100 mg daily to help with rhythm control.  We will start this.  He has expressed a  desire for less invasive approaches to his management and care and I think amiodarone low-dose would be a good addition.  His recent thyroid studies normal.  We will obtain a baseline chest x-ray.  We will then continue to follow with him to determine if he can come off this.  It may be a good idea given his age is remain on amiodarone.  5. First degree AV block -First-degree block) 20 ms.  No symptoms of dizziness or fatigue.  Does not seem to be bothersome for him.  We will monitor this closely on amiodarone.  Disposition: Return in about 6 months (around 01/15/2020).  Medication Adjustments/Labs and Tests Ordered: Current medicines are reviewed at length with the patient today.  Concerns regarding medicines are outlined above.  Orders Placed This Encounter  Procedures  . DG Chest 2 View  . ECHOCARDIOGRAM COMPLETE   Meds ordered this encounter  Medications  . furosemide (LASIX) 20 MG tablet    Sig: Take 1 tablet (20 mg total) by mouth daily.    Dispense:  90 tablet    Refill:  3  . losartan (COZAAR) 100 MG tablet    Sig: Take 1 tablet (100 mg total) by mouth daily.    Dispense:  90 tablet    Refill:  3  . apixaban (ELIQUIS) 2.5 MG TABS tablet    Sig: Take 1 tablet (2.5 mg total) by mouth 2 (two) times daily.    Dispense:  60 tablet    Refill:  2  . carvedilol (COREG) 12.5 MG tablet    Sig: Take 1 tablet (12.5 mg total) by mouth 2 (two) times daily.    Dispense:  180 tablet    Refill:  3  . amiodarone (PACERONE) 100 MG tablet    Sig: Take 1 tablet (100 mg total) by mouth daily.    Dispense:  90 tablet    Refill:  1    Patient Instructions  Medication Instructions:  Stop Hydrochlorothiazide  Start Amiodarone 100 mg daily   *If you need a refill on your cardiac medications before your next appointment, please call your pharmacy*   Testing/Procedures: Echocardiogram (before appointment in 5 months) - Your physician has requested that you have an echocardiogram.  Echocardiography is a painless test that uses sound waves to create images of your heart. It provides your doctor with information about the size and shape of your heart and how well your heart's chambers and valves are working. This procedure takes approximately one hour. There are no restrictions for this procedure. This will be performed at our [facility] location - [address].  Chest xray - Your physician has requested that you have a chest xray, is a fast and painless imaging test that uses certain electromagnetic waves to create pictures of the structures in and around your chest. This test can help diagnose and monitor conditions such as pneumonia and other lung issues his will be done at [facility], [place]. If you should need to call them their phone number is [phone].   Follow-Up: At [facility], you and your health needs are our priority.  As part of our continuing mission to provide you with exceptional heart care, we have created designated Provider Care Teams.  These Care Teams include your primary Cardiologist (physician) and Advanced Practice Providers (  APPs -  Physician Assistants and Nurse Practitioners) who all work together to provide you with the care you need, when you need it.  We recommend signing up for the patient portal called "MyChart".  Sign up information is provided on this After Visit Summary.  MyChart is used to connect with patients for Virtual Visits (Telemedicine).  Patients are able to view lab/test results, encounter notes, upcoming appointments, etc.  Non-urgent messages can be sent to your provider as well.   To learn more about what you can do with MyChart, go to [email or web address].    Your next appointment:   5 month(s)  The format for your next appointment:   In Person  Provider:   Eleonore Chiquito, MD         Time Spent with Patient: I have spent a total of 35 minutes with patient reviewing  hospital notes, telemetry, EKGs, labs and examining the patient as well as establishing an assessment and plan that was discussed with the patient.  > 50% of time was spent in direct patient care.  Signed, Addison Naegeli. Audie Box, [place]  [address] [place], [place] [address] [phone]  07/15/2019 10:57 AM

## 2019-07-15 ENCOUNTER — Ambulatory Visit (INDEPENDENT_AMBULATORY_CARE_PROVIDER_SITE_OTHER): Payer: Medicare HMO | Admitting: Cardiovascular Disease

## 2019-07-15 ENCOUNTER — Telehealth: Payer: Self-pay

## 2019-07-15 ENCOUNTER — Encounter: Payer: Self-pay | Admitting: Cardiovascular Disease

## 2019-07-15 ENCOUNTER — Other Ambulatory Visit: Payer: Self-pay

## 2019-07-15 VITALS — BP 109/49 | HR 60 | Ht 71.0 in | Wt 191.0 lb

## 2019-07-15 DIAGNOSIS — I4819 Other persistent atrial fibrillation: Secondary | ICD-10-CM | POA: Diagnosis not present

## 2019-07-15 DIAGNOSIS — I447 Left bundle-branch block, unspecified: Secondary | ICD-10-CM | POA: Diagnosis not present

## 2019-07-15 DIAGNOSIS — I440 Atrioventricular block, first degree: Secondary | ICD-10-CM

## 2019-07-15 DIAGNOSIS — R0602 Shortness of breath: Secondary | ICD-10-CM

## 2019-07-15 DIAGNOSIS — I5022 Chronic systolic (congestive) heart failure: Secondary | ICD-10-CM

## 2019-07-15 MED ORDER — LOSARTAN POTASSIUM 100 MG PO TABS: 100.0000 mg | ORAL_TABLET | Freq: Every day | ORAL | 3 refills | Status: DC

## 2019-07-15 MED ORDER — CARVEDILOL 12.5 MG PO TABS: 12.5000 mg | ORAL_TABLET | Freq: Two times a day (BID) | ORAL | 3 refills | Status: DC

## 2019-07-15 MED ORDER — AMIODARONE HCL 100 MG PO TABS
100.0000 mg | ORAL_TABLET | Freq: Every day | ORAL | 1 refills | Status: DC
Start: 2019-07-15 — End: 2019-09-07

## 2019-07-15 MED ORDER — APIXABAN 2.5 MG PO TABS: 2.5000 mg | ORAL_TABLET | Freq: Two times a day (BID) | ORAL | 2 refills | Status: DC

## 2019-07-15 MED ORDER — FUROSEMIDE 20 MG PO TABS: 20.0000 mg | ORAL_TABLET | Freq: Every day | ORAL | 3 refills | Status: DC

## 2019-07-15 NOTE — Telephone Encounter (Signed)
Called patient since he no showed his morning appointment-  LVM for patient to call back, just wanted to check in on him. Left call back number.

## 2019-07-15 NOTE — Patient Instructions (Signed)
Medication Instructions:  Stop Hydrochlorothiazide  Start Amiodarone 100 mg daily   *If you need a refill on your cardiac medications before your next appointment, please call your pharmacy*   Testing/Procedures: Echocardiogram (before appointment in 5 months) - Your physician has requested that you have an echocardiogram. Echocardiography is a painless test that uses sound waves to create images of your heart. It provides your doctor with information about the size and shape of your heart and how well your heart's chambers and valves are working. This procedure takes approximately one hour. There are no restrictions for this procedure. This will be performed at our [facility] location - [address], [address].  Chest xray - Your physician has requested that you have a chest xray, is a fast and painless imaging test that uses certain electromagnetic waves to create pictures of the structures in and around your chest. This test can help diagnose and monitor conditions such as pneumonia and other lung issues his will be done at [facility], [place]. If you should need to call them their phone number is [phone].   Follow-Up: At [facility], you and your health needs are our priority.  As part of our continuing mission to provide you with exceptional heart care, we have created designated Provider Care Teams.  These Care Teams include your primary Cardiologist (physician) and Advanced Practice Providers (APPs -  Physician Assistants and Nurse Practitioners) who all work together to provide you with the care you need, when you need it.  We recommend signing up for the patient portal called "MyChart".  Sign up information is provided on this After Visit Summary.  MyChart is used to connect with patients for Virtual Visits (Telemedicine).  Patients are able to view lab/test results, encounter notes, upcoming appointments, etc.  Non-urgent messages can be sent to your  provider as well.   To learn more about what you can do with MyChart, go to [email or web address].    Your next appointment:   5 month(s)  The format for your next appointment:   In Person  Provider:   Eleonore Chiquito, MD

## 2019-07-19 NOTE — Addendum Note (Signed)
Addended by: Wonda Horner on: 07/19/2019 10:51 AM   Modules accepted: Orders

## 2019-07-27 ENCOUNTER — Ambulatory Visit
Admission: RE | Admit: 2019-07-27 | Discharge: 2019-07-27 | Disposition: A | Discharge status: Home / Self Care | Payer: Medicare HMO | Source: Ambulatory Visit | Attending: Cardiovascular Disease | Admitting: Cardiovascular Disease

## 2019-07-27 DIAGNOSIS — I5022 Chronic systolic (congestive) heart failure: Secondary | ICD-10-CM

## 2019-08-20 ENCOUNTER — Other Ambulatory Visit: Payer: Self-pay | Admitting: Cardiovascular Disease

## 2019-09-07 ENCOUNTER — Other Ambulatory Visit: Payer: Self-pay

## 2019-09-07 ENCOUNTER — Telehealth: Payer: Self-pay | Admitting: Cardiovascular Disease

## 2019-09-07 MED ORDER — AMIODARONE HCL 100 MG PO TABS: 100.0000 mg | ORAL_TABLET | Freq: Every day | ORAL | 3 refills | Status: DC

## 2019-09-07 NOTE — Telephone Encounter (Signed)
  Patient calling the office for samples of medication:   1.  What medication and dosage are you requesting samples for? Pacerone 100 mg  2.  Are you currently out of this medication? 4 left patient patient also have some questions concering some other medication.

## 2019-09-13 ENCOUNTER — Telehealth: Payer: Self-pay | Admitting: Cardiovascular Disease

## 2019-09-13 NOTE — Telephone Encounter (Signed)
°*  STAT* If patient is at the pharmacy, call can be transferred to refill team.   1. Which medications need to be refilled? (please list name of each medication and dose if known) Paceron 100 mg  2. Which pharmacy/location (including street and city if local pharmacy) is medication to be sent to? [facility]  3. Do they need a 30 day or 90 day supply? [place]

## 2019-09-14 ENCOUNTER — Other Ambulatory Visit: Payer: Self-pay

## 2019-09-15 ENCOUNTER — Telehealth: Payer: Self-pay | Admitting: Cardiovascular Disease

## 2019-09-15 NOTE — Telephone Encounter (Signed)
Pt c/o medication issue:  1. Name of Medication: amiodarone (PACERONE) 100 MG tablet  2. How are you currently taking this medication (dosage and times per day)? 1 tablet a day  3. Are you having a reaction (difficulty breathing--STAT)? no  4. What is your medication issue? Patient states he is out of the medication, but his pharmacy won't give him the refill until 09/22/2019.

## 2019-09-15 NOTE — Telephone Encounter (Signed)
Called the patient's pharmacy, who states the patient lost some of his pills and is not eligible (sue to insurance) for more amiodarone until 9/16. He was quoted $140 for a 30 day supply and he is unwilling to pay that. Requested pricing for 7 day supply ($38). Called the patient and asked if new price for 1 week is OK. He agreed and will call pharmacy to fill out of pocket until eligible for full refill. He was grateful for assistance.

## 2019-12-15 ENCOUNTER — Ambulatory Visit (HOSPITAL_COMMUNITY): Payer: Medicare HMO | Attending: Cardiovascular Disease

## 2019-12-15 ENCOUNTER — Other Ambulatory Visit: Payer: Self-pay

## 2019-12-15 DIAGNOSIS — R0602 Shortness of breath: Secondary | ICD-10-CM | POA: Diagnosis present

## 2019-12-15 DIAGNOSIS — I5022 Chronic systolic (congestive) heart failure: Secondary | ICD-10-CM | POA: Insufficient documentation

## 2019-12-15 LAB — ECHOCARDIOGRAM COMPLETE
Area-P 1/2: 3.6 cm2
MV M vel: 5.29 m/s
MV Peak grad: 111.9 mmHg
S' Lateral: 4.8 cm

## 2019-12-22 NOTE — Progress Notes (Signed)
Cardiology Office Note:   Date:  12/26/2019  NAME:  Stanley Moore    MRN: [id number] DOB:  1931-11-26   PCP:  Heywood Bene, PA-C  Cardiologist:  No primary care provider on file.   Referring MD: No ref. provider found   Chief Complaint  Patient presents with  . Congestive Heart Failure   History of Present Illness:   Stanley Moore is a 87 y.o. male with a hx of CHF, DM, HLD, persistent Afib who presents for follow-up. EF has not improved.  He reports he has been doing fairly well.  He reports he had to take Lasix twice a day for several days due to orthopnea.  This improved his symptoms.  He is doing much better.  He also had a syncopal event 2 months ago.  Apparently was doing a lot of heavy walking.  He reports he was in the grocery store and once he got to his car he blacked out.  No chest pain or shortness of breath prior to the event.  He came to relatively quickly.  No seizure-like activity reported.  He reports he did not seek emergency medical care.  We did discuss that his heart function has not recovered.  We discussed possibly pursuing left heart catheterization as his most recent stress test was normal.  We may be missing three-vessel CAD.  He reports he is not interested.  He expresses a desire to continue with medical management for now.  He does not express desire for aggressive measures.  He reports he is DNR.  His EKG today demonstrates sinus bradycardia with a first-degree block of 354 ms.  He is in sinus rhythm.  EKG also shows left bundle branch block with QRS 166 ms.  We discussed possibly pursuing a pacemaker.  He reports he is not interested.  He reports he feels good he had this isolated passing out episode.  He reports overall he is okay to forego any aggressive medical care.  He denies any chest pain or shortness of breath.  Only had his isolated one-time episode of syncope.  Maintaining sinus rhythm.  Problem List 1. HTN 2. HLD -T chol 105,  triglycerides 69, HDL 40, LDL 61 3. DM -A1c 7.1 4. CKD 3 - EGFR 36 5. Persistent Afib -DCCV 06/27/2019 6. Systolic HF, 40-08% -normal MPI -EF 12/15/2019 -> 25% 7. 1AVB -354 msec 8. LBBB -QRS 166 ms 9. Moderate MR -ERO 0.15 cm2, R vol 32 cc, RF 40%  Past Medical History: Past Medical History:  Diagnosis Date  . CHF (congestive heart failure) ([place])   . Diabetes mellitus without complication ([place])   . Heart murmur   . Hyperlipidemia   . Hypertension     Past Surgical History: Past Surgical History:  Procedure Laterality Date  . appendicitis    . CARDIOVERSION N/A 06/27/2019   Procedure: CARDIOVERSION;  Surgeon: Elouise Munroe, MD;  Location[facility] ENDOSCOPY;  Service: Cardiovascular;  Laterality: N/A;  . MOLE REMOVAL      Current Medications: Current Meds  Medication Sig  . amiodarone (PACERONE) 100 MG tablet Take 1 tablet (100 mg total) by mouth daily.  Gretta Arab 450 MG CAPS Take 450 mg by mouth daily.  . Cinnamon 500 MG capsule Take 1,500 mg by mouth daily.  . clindamycin (CLEOCIN) 300 MG capsule Take 300 mg by mouth 3 (three) times daily.  Marland Kitchen ELIQUIS 2.5 MG TABS tablet Take 1 tablet by mouth twice daily  . Garlic Oil 6761  MG CAPS Take 1,000 mg by mouth daily.   . Multiple Vitamins-Minerals (MULTIVITAMIN WITH MINERALS) tablet Take 1 tablet by mouth daily. Iron  . Oil of Oregano 1500 MG CAPS Take 1,500 mg by mouth daily.  Marland Kitchen OVER THE COUNTER MEDICATION Take 1 capsule by mouth daily. Rose Mary leaf  . simvastatin (ZOCOR) 20 MG tablet Take 1 tablet (20 mg total) by mouth every evening.  . terbinafine (LAMISIL) 250 MG tablet Take 250 mg by mouth daily. For 3 months  . Turmeric 500 MG TABS Take 500 mg by mouth daily.   Marland Kitchen Ubiquinol 100 MG CAPS Take 100 mg by mouth daily.  . Vitamin D3 (VITAMIN D) 25 MCG tablet Take 1,000 Units by mouth daily.     Allergies:    Patient has no known allergies.   Social History: Social History   Socioeconomic History  . Marital status:  Married    Spouse name: Not on file  . Number of children: 3  . Years of education: Not on file  . Highest education level: Not on file  Occupational History  . Not on file  Tobacco Use  . Smoking status: Former Smoker    Years: 5.00  . Smokeless tobacco: Never Used  Substance and Sexual Activity  . Alcohol use: Yes    Comment: wine with dinner  . Drug use: No  . Sexual activity: Not on file  Other Topics Concern  . Not on file  Social History Narrative  . Not on file   Social Determinants of Health   Financial Resource Strain: Not on file  Food Insecurity: Not on file  Transportation Needs: Not on file  Physical Activity: Not on file  Stress: Not on file  Social Connections: Not on file    Family History: The patient's family history includes Diabetes in his mother.  ROS:   All other ROS reviewed and negative. Pertinent positives noted in the HPI.     EKGs/Labs/Other Studies Reviewed:   The following studies were personally reviewed by me today:  EKG:  EKG is ordered today.  The ekg ordered today demonstrates sinus bradycardia, heart rate 54, first-degree AV block 354 ms, left bundle branch block QRS 166 ms, and was personally reviewed by me.   NM Stress 06/30/2019  The study could not be gated so we are unable to assess the LV function.  This is a low risk study. There is no evidnece of ischemia or previous infarction.  TTE 12/15/2019 1. Diffuse hypokinesis with inferior basal akinesis and ischemic MR. Left  ventricular ejection fraction, by estimation, is 25 to 30%. The left  ventricle has severely decreased function. The left ventricle demonstrates  global hypokinesis. The left  ventricular internal cavity size was moderately dilated. Left ventricular  diastolic parameters were normal.  2. Right ventricular systolic function is normal. The right ventricular  size is normal.  3. Left atrial size was moderately dilated.  4. Right atrial size was moderately  dilated.  5. The mitral valve is degenerative. Moderate mitral valve regurgitation.  No evidence of mitral stenosis.  6. The aortic valve is tricuspid. Aortic valve regurgitation is not  visualized. Mild to moderate aortic valve sclerosis/calcification is  present, without any evidence of aortic stenosis.  7. The inferior vena cava is normal in size with greater than 50%  respiratory variability, suggesting right atrial pressure of 3 mmHg.   Recent Labs: 06/02/2019: BNP 321.8; TSH 2.740 06/21/2019: BUN 26; Creatinine, Ser 1.49; Hemoglobin 11.6; Platelets  166; Potassium 4.6; Sodium 136   Recent Lipid Panel    Component Value Date/Time   CHOL 145 12/16/2012 1228   TRIG 219.0 (H) 12/16/2012 1228   HDL 32.20 (L) 12/16/2012 1228   CHOLHDL 5 12/16/2012 1228   VLDL 43.8 (H) 12/16/2012 1228   LDLCALC 57 02/13/2012 0909   LDLDIRECT 80.7 12/16/2012 1228    Physical Exam:   VS:  BP (!) 144/70 (BP Location: Left Arm, Patient Position: Sitting)   Pulse 64   Ht 5' 11" (1.803 m)   Wt 191 lb 6.4 oz (86.8 kg)   SpO2 91%   BMI 26.69 kg/m    Wt Readings from Last 3 Encounters:  12/26/19 191 lb 6.4 oz (86.8 kg)  07/15/19 191 lb (86.6 kg)  06/30/19 190 lb (86.2 kg)    General: Well nourished, well developed, in no acute distress Head: Atraumatic, normal size  Eyes: PEERLA, EOMI  Neck: Supple, no JVD Endocrine: No thryomegaly Cardiac: Normal S1, S2; RRR; 2/6 HSM Lungs: Clear to auscultation bilaterally, no wheezing, rhonchi or rales  Abd: Soft, nontender, no hepatomegaly  Ext: No edema, pulses 2+ Musculoskeletal: No deformities, BUE and BLE strength normal and equal Skin: Warm and dry, no rashes   Neuro: Alert and oriented to person, place, time, and situation, CNII-XII grossly intact, no focal deficits  Psych: Normal mood and affect   ASSESSMENT:   Stanley Moore is a 86 y.o. male who presents for the following: 1. Chronic systolic heart failure ([place])   2. LBBB (left bundle  branch block)   3. Mixed hyperlipidemia   4. Nonrheumatic mitral valve regurgitation   5. Persistent atrial fibrillation ([place])   6. Syncope and collapse     PLAN:   1. Chronic systolic heart failure ([place]), EF 20-25%, near Heart Association class III, ACC AHA stage C -EF now recovered.  EF down to 25%.  He has a left bundle branch block with a QRS of 166 ms.  Had a nuclear medicine stress test that was normal. -We discussed that we may be missing triple-vessel CAD.  I recommend a heart catheterization.  He declined.  He reports he is not interested in aggressive medical therapy.  He reports he feels well overall. -He did have a syncopal event.  He reports no prodrome of chest pain or shortness of breath prior to the episode.  We discussed pursuing a heart monitor and heart cath.  He is not interested. -QRS 166 ms with left bundle branch block morphology.  We discussed possible pacemaker evaluation.  I think he would benefit given his dyssynchrony.  Also has moderate MR.  He reports he is not interested. -Overall his goals of care are more in line with medical management.  He reports he is not interested in aggressive care. -A. fib is controlled with amiodarone. -We will continue with Lasix 20 mg once a day.  He can take an extra tablet as needed. -I have recommended Jardiance 10 mg daily.  We will check a BMP.  Most recent EGFR 36 and he will tolerate this. -We will continue beta-blocker for now 12.5 mg twice daily.  He does have a prolonged first-degree AV block.  He also has bradycardia.  He is not interested in pacemaker.  He will let me know if he has any other syncopal events.  We may need to pursue a monitor. -He is on losartan 100 mg a day.  I will call him about starting Aldactone.  I want a  make sure his kidney function is stable.  Most recent potassium 4.5.  If it is still stable we will start Aldactone by phone. -Not interested in ICD.  Not answering pacing.  Given his age I would not  recommend an ICD.  I think CRT-P may be indicated but he is refusing.  We will continue with medical care.  2. LBBB (left bundle branch block) -QRS 166 ms.  Significant dyssynchrony.  He is anxious and pacing.  3. Mixed hyperlipidemia -Most recent LDL cholesterol 61.  Continue statin.  4. Nonrheumatic mitral valve regurgitation -He has moderate MR.  Continue with medical therapy for cardiomyopathy.  Not interested in aggressive therapies.  5.  Persistent atrial fibrillation -Status post cardioversion.  Continue Eliquis 5 mg twice daily.  On amiodarone 100 mg a day for rhythm control.  Maintaining sinus rhythm on EKG.  6.  Syncope -He has syncopal event 2 months ago.  Apparently just blacked out.  No prodrome of chest pain or shortness of breath.  We discussed left heart catheterization as well as monitor.  He reports he is not interested.  His expressed desire for medical management.  He is not interested in aggressive medical care.  Disposition: Return in about 4 months (around 04/25/2020).  Medication Adjustments/Labs and Tests Ordered: Current medicines are reviewed at length with the patient today.  Concerns regarding medicines are outlined above.  Orders Placed This Encounter  Procedures  . Basic metabolic panel  . EKG 12-Lead   Meds ordered this encounter  Medications  . empagliflozin (JARDIANCE) 10 MG TABS tablet    Sig: Take 1 tablet (10 mg total) by mouth daily before breakfast.    Dispense:  90 tablet    Refill:  1    Patient Instructions  Medication Instructions:  Start Jardiance 10 mg daily   *If you need a refill on your cardiac medications before your next appointment, please call your pharmacy*   Lab Work: BMET today   If you have labs (blood work) drawn today and your tests are completely normal, you will receive your results only by: Marland Kitchen MyChart Message (if you have MyChart) OR . A paper copy in the mail If you have any lab test that is abnormal or we need  to change your treatment, we will call you to review the results.   Follow-Up: At [facility], you and your health needs are our priority.  As part of our continuing mission to provide you with exceptional heart care, we have created designated Provider Care Teams.  These Care Teams include your primary Cardiologist (physician) and Advanced Practice Providers (APPs -  Physician Assistants and Nurse Practitioners) who all work together to provide you with the care you need, when you need it.  We recommend signing up for the patient portal called "MyChart".  Sign up information is provided on this After Visit Summary.  MyChart is used to connect with patients for Virtual Visits (Telemedicine).  Patients are able to view lab/test results, encounter notes, upcoming appointments, etc.  Non-urgent messages can be sent to your provider as well.   To learn more about what you can do with MyChart, go to [email or web address].    Your next appointment:   4 month(s)  The format for your next appointment:   In Person  Provider:   Eleonore Chiquito, MD        Time Spent with Patient: I have spent a total of 35 minutes with patient reviewing hospital notes, telemetry, EKGs, labs  and examining the patient as well as establishing an assessment and plan that was discussed with the patient.  > 50% of time was spent in direct patient care.  Signed, Addison Naegeli. Audie Box, [place]  [address] [place], [place] [address] [phone]  12/26/2019 10:48 AM

## 2019-12-26 ENCOUNTER — Encounter: Payer: Self-pay | Admitting: Cardiovascular Disease

## 2019-12-26 ENCOUNTER — Other Ambulatory Visit: Payer: Self-pay

## 2019-12-26 ENCOUNTER — Ambulatory Visit: Payer: Medicare HMO | Admitting: Cardiovascular Disease

## 2019-12-26 VITALS — BP 144/70 | HR 64 | Ht 71.0 in | Wt 191.4 lb

## 2019-12-26 DIAGNOSIS — R55 Syncope and collapse: Secondary | ICD-10-CM

## 2019-12-26 DIAGNOSIS — I5022 Chronic systolic (congestive) heart failure: Secondary | ICD-10-CM

## 2019-12-26 DIAGNOSIS — I447 Left bundle-branch block, unspecified: Secondary | ICD-10-CM

## 2019-12-26 DIAGNOSIS — E782 Mixed hyperlipidemia: Secondary | ICD-10-CM

## 2019-12-26 DIAGNOSIS — I4819 Other persistent atrial fibrillation: Secondary | ICD-10-CM

## 2019-12-26 DIAGNOSIS — I340 Nonrheumatic mitral (valve) insufficiency: Secondary | ICD-10-CM

## 2019-12-26 LAB — BASIC METABOLIC PANEL
BUN/Creatinine Ratio: 20 (ref 10–24)
BUN: 30 mg/dL — ABNORMAL HIGH (ref 8–27)
CO2: 24 mmol/L (ref 20–29)
Calcium: 8.7 mg/dL (ref 8.6–10.2)
Chloride: 105 mmol/L (ref 96–106)
Creatinine, Ser: 1.53 mg/dL — ABNORMAL HIGH (ref 0.76–1.27)
GFR calc Af Amer: 47 mL/min/{1.73_m2} — ABNORMAL LOW (ref >59)
GFR calc non Af Amer: 41 mL/min/{1.73_m2} — ABNORMAL LOW (ref >59)
Glucose: 138 mg/dL — ABNORMAL HIGH (ref 65–99)
Potassium: 4.6 mmol/L (ref 3.5–5.2)
Sodium: 140 mmol/L (ref 134–144)

## 2019-12-26 MED ORDER — EMPAGLIFLOZIN 10 MG PO TABS: 10.0000 mg | ORAL_TABLET | Freq: Every day | ORAL | 1 refills | Status: DC

## 2019-12-26 NOTE — Patient Instructions (Signed)
Medication Instructions:  Start Jardiance 10 mg daily   *If you need a refill on your cardiac medications before your next appointment, please call your pharmacy*   Lab Work: BMET today   If you have labs (blood work) drawn today and your tests are completely normal, you will receive your results only by: Marland Kitchen MyChart Message (if you have MyChart) OR . A paper copy in the mail If you have any lab test that is abnormal or we need to change your treatment, we will call you to review the results.   Follow-Up: At [facility], you and your health needs are our priority.  As part of our continuing mission to provide you with exceptional heart care, we have created designated Provider Care Teams.  These Care Teams include your primary Cardiologist (physician) and Advanced Practice Providers (APPs -  Physician Assistants and Nurse Practitioners) who all work together to provide you with the care you need, when you need it.  We recommend signing up for the patient portal called "MyChart".  Sign up information is provided on this After Visit Summary.  MyChart is used to connect with patients for Virtual Visits (Telemedicine).  Patients are able to view lab/test results, encounter notes, upcoming appointments, etc.  Non-urgent messages can be sent to your provider as well.   To learn more about what you can do with MyChart, go to [email or web address].    Your next appointment:   4 month(s)  The format for your next appointment:   In Person  Provider:   Eleonore Chiquito, MD

## 2019-12-27 ENCOUNTER — Other Ambulatory Visit: Payer: Self-pay

## 2019-12-27 DIAGNOSIS — I5022 Chronic systolic (congestive) heart failure: Secondary | ICD-10-CM

## 2019-12-27 MED ORDER — SPIRONOLACTONE 25 MG PO TABS: 12.5000 mg | ORAL_TABLET | Freq: Every day | ORAL | 1 refills | Status: DC

## 2020-01-17 ENCOUNTER — Other Ambulatory Visit: Payer: Self-pay

## 2020-01-17 DIAGNOSIS — I5022 Chronic systolic (congestive) heart failure: Secondary | ICD-10-CM

## 2020-01-17 LAB — BASIC METABOLIC PANEL
BUN/Creatinine Ratio: 19 (ref 10–24)
BUN: 34 mg/dL — ABNORMAL HIGH (ref 8–27)
CO2: 22 mmol/L (ref 20–29)
Calcium: 9.1 mg/dL (ref 8.6–10.2)
Chloride: 103 mmol/L (ref 96–106)
Creatinine, Ser: 1.81 mg/dL — ABNORMAL HIGH (ref 0.76–1.27)
GFR calc Af Amer: 38 mL/min/{1.73_m2} — ABNORMAL LOW (ref >59)
GFR calc non Af Amer: 33 mL/min/{1.73_m2} — ABNORMAL LOW (ref >59)
Glucose: 144 mg/dL — ABNORMAL HIGH (ref 65–99)
Potassium: 4.9 mmol/L (ref 3.5–5.2)
Sodium: 139 mmol/L (ref 134–144)

## 2020-02-13 ENCOUNTER — Other Ambulatory Visit: Payer: Self-pay

## 2020-02-13 MED ORDER — SIMVASTATIN 20 MG PO TABS
20.0000 mg | ORAL_TABLET | Freq: Every evening | ORAL | 3 refills | Status: DC
Start: 2020-02-13 — End: 2020-11-01

## 2020-03-31 ENCOUNTER — Other Ambulatory Visit: Payer: Self-pay | Admitting: Cardiovascular Disease

## 2020-04-07 ENCOUNTER — Other Ambulatory Visit: Payer: Self-pay | Admitting: Cardiovascular Disease

## 2020-04-29 NOTE — Progress Notes (Signed)
Cardiology Office Note:   Date:  04/30/2020  NAME:  BRANDOL CORP    MRN: [id number] DOB:  Aug 14, 1931   PCP:  Heywood Bene, PA-C  Cardiologist:  No primary care provider on file.   Referring MD: Heywood Bene, *   Chief Complaint  Patient presents with  . Coronary Artery Disease   History of Present Illness:   BIRCH FARINO is a 87 y.o. male with a hx of systolic HF 40-98%, DM, HTN, Afib who presents for follow-up. Found to have new cardiomyopathy last year and Afib. EF did not improve with medical management. Reported last year he is only interested in medical management. We added aldactone. Also on jardiance. He reports he is doing well.  Denies any symptoms of chest pain or shortness of breath.  No increased lower extremity edema.  Kidney function is stable on losartan and Aldactone.  He is on Jardiance as well.  Doing well on both medications.  Pulses regular on examination.  No evidence of recurrence of atrial fibrillation.  No further passing out episodes that led to his initial consultation.  He overall is doing well without any chest pain or shortness of breath.  Seems to be very stable.  He reports he still is only interested in medical management.  He is not interested in invasive treatments.  Problem List 1. HTN 2. HLD -T chol 105, triglycerides 69, HDL 40, LDL 61 3. DM -A1c 7.1 4. CKD 3 - EGFR 36 5. Persistent Afib -DCCV 06/27/2019 6. Systolic HF, 11-91% -normal MPI -EF 12/15/2019 -> 25% -only interested in medical management  7. 1AVB -354 msec 8. LBBB -QRS 166 ms 9. Moderate MR -ERO 0.15 cm2, R vol 32 cc, RF 40%  Past Medical History: Past Medical History:  Diagnosis Date  . CHF (congestive heart failure) ([place])   . Diabetes mellitus without complication ([place])   . Heart murmur   . Hyperlipidemia   . Hypertension     Past Surgical History: Past Surgical History:  Procedure Laterality Date  . appendicitis    . CARDIOVERSION  N/A 06/27/2019   Procedure: CARDIOVERSION;  Surgeon: Elouise Munroe, MD;  Location: [facility] ENDOSCOPY;  Service: Cardiovascular;  Laterality: N/A;  . MOLE REMOVAL      Current Medications: Current Meds  Medication Sig  . amiodarone (PACERONE) 100 MG tablet Take 1 tablet (100 mg total) by mouth daily.  Gretta Arab 450 MG CAPS Take 450 mg by mouth daily.  . Cinnamon 500 MG capsule Take 1,500 mg by mouth daily.  Marland Kitchen ELIQUIS 2.5 MG TABS tablet Take 1 tablet by mouth twice daily  . Garlic Oil 4782 MG CAPS Take 1,000 mg by mouth daily.   Marland Kitchen JARDIANCE 10 MG TABS tablet TAKE 1 TABLET BY MOUTH EVERY DAY WITH BREAKFAST  . Multiple Vitamins-Minerals (MULTIVITAMIN WITH MINERALS) tablet Take 1 tablet by mouth daily. Iron  . Oil of Oregano 1500 MG CAPS Take 1,500 mg by mouth daily.  Marland Kitchen OVER THE COUNTER MEDICATION Take 1 capsule by mouth daily. Rose Mary leaf  . simvastatin (ZOCOR) 20 MG tablet Take 1 tablet (20 mg total) by mouth every evening.  Marland Kitchen spironolactone (ALDACTONE) 25 MG tablet TAKE 1/2 TABLET ONCE A DAY  . terbinafine (LAMISIL) 250 MG tablet Take 250 mg by mouth daily. For 3 months  . Turmeric 500 MG TABS Take 500 mg by mouth daily.   Marland Kitchen Ubiquinol 100 MG CAPS Take 100 mg by mouth daily.  . Vitamin  D3 (VITAMIN D) 25 MCG tablet Take 1,000 Units by mouth daily.     Allergies:    Patient has no known allergies.   Social History: Social History   Socioeconomic History  . Marital status: Married    Spouse name: Not on file  . Number of children: 3  . Years of education: Not on file  . Highest education level: Not on file  Occupational History  . Not on file  Tobacco Use  . Smoking status: Former Smoker    Years: 5.00  . Smokeless tobacco: Never Used  Substance and Sexual Activity  . Alcohol use: Yes    Comment: wine with dinner  . Drug use: No  . Sexual activity: Not on file  Other Topics Concern  . Not on file  Social History Narrative  . Not on file   Social Determinants of Health    Financial Resource Strain: Not on file  Food Insecurity: Not on file  Transportation Needs: Not on file  Physical Activity: Not on file  Stress: Not on file  Social Connections: Not on file    Family History: The patient's family history includes Diabetes in his mother.  ROS:   All other ROS reviewed and negative. Pertinent positives noted in the HPI.     EKGs/Labs/Other Studies Reviewed:   The following studies were personally reviewed by me today:  NM Stress 06/30/2019   The study could not be gated so we are unable to assess the LV function.  This is a low risk study. There is no evidnece of ischemia or previous infarction.    TTE 12/15/2019 1. Diffuse hypokinesis with inferior basal akinesis and ischemic MR. Left  ventricular ejection fraction, by estimation, is 25 to 30%. The left  ventricle has severely decreased function. The left ventricle demonstrates  global hypokinesis. The left  ventricular internal cavity size was moderately dilated. Left ventricular  diastolic parameters were normal.  2. Right ventricular systolic function is normal. The right ventricular  size is normal.  3. Left atrial size was moderately dilated.  4. Right atrial size was moderately dilated.  5. The mitral valve is degenerative. Moderate mitral valve regurgitation.  No evidence of mitral stenosis.  6. The aortic valve is tricuspid. Aortic valve regurgitation is not  visualized. Mild to moderate aortic valve sclerosis/calcification is  present, without any evidence of aortic stenosis.  7. The inferior vena cava is normal in size with greater than 50%  respiratory variability, suggesting right atrial pressure of 3 mmHg.   Recent Labs: 06/02/2019: BNP 321.8; TSH 2.740 06/21/2019: Hemoglobin 11.6; Platelets 166 01/17/2020: BUN 34; Creatinine, Ser 1.81; Potassium 4.9; Sodium 139   Recent Lipid Panel    Component Value Date/Time   CHOL 145 12/16/2012 1228   TRIG 219.0 (H)  12/16/2012 1228   HDL 32.20 (L) 12/16/2012 1228   CHOLHDL 5 12/16/2012 1228   VLDL 43.8 (H) 12/16/2012 1228   LDLCALC 57 02/13/2012 0909   LDLDIRECT 80.7 12/16/2012 1228    Physical Exam:   VS:  BP 136/60   Pulse (!) 55   Ht 5' 11"  (1.803 m)   Wt 187 lb (84.8 kg)   SpO2 95%   BMI 26.08 kg/m    Wt Readings from Last 3 Encounters:  04/30/20 187 lb (84.8 kg)  12/26/19 191 lb 6.4 oz (86.8 kg)  07/15/19 191 lb (86.6 kg)    General: Well nourished, well developed, in no acute distress Head: Atraumatic, normal size  Eyes:  PEERLA, EOMI  Neck: Supple, no JVD Endocrine: No thryomegaly Cardiac: Normal S1, S2; RRR; no murmurs, rubs, or gallops Lungs: Clear to auscultation bilaterally, no wheezing, rhonchi or rales  Abd: Soft, nontender, no hepatomegaly  Ext: No edema, pulses 2+ Musculoskeletal: No deformities, BUE and BLE strength normal and equal Skin: Warm and dry, no rashes   Neuro: Alert and oriented to person, place, time, and situation, CNII-XII grossly intact, no focal deficits  Psych: Normal mood and affect   ASSESSMENT:   Stanley Moore is a 87 y.o. male who presents for the following: 1. Chronic systolic heart failure ([place])   2. LBBB (left bundle branch block)   3. Mixed hyperlipidemia   4. Nonrheumatic mitral valve regurgitation   5. Persistent atrial fibrillation ([place])     PLAN:   1. Chronic systolic heart failure ([place]) 2. LBBB (left bundle branch block) -Systolic heart failure with EF 25%.  EF has not recovered despite medical management.  Negative nuclear medicine stress test.  We still could be missing triple-vessel CAD.  He has no symptoms of angina to suggest this. -He also has a wide left bundle branch block with QRS 166 ms.  This could be contributing as he has considerable LV dyssynchrony on his echocardiogram. -We discussed left heart catheterization as well as possible pacemaker implantation.  He reports he feels well and is not interested in these  aggressive measures.  I think that is reasonable. -He is not that active without any major symptoms.  Volume status is acceptable. -Continue Coreg 12.5 twice daily.  Aldactone 12.5 mg daily.  Losartan 100 mg daily.  Jardiance 10 mg daily.  Kidney function is stable.  He is on Lasix 20 mg daily. -We have pursued a rhythm control strategy with amiodarone given his A. fib.  He is maintaining sinus rhythm from what I can tell.  No significant bradycardia.  3. Mixed hyperlipidemia -Continue simvastatin.  Most recent LDL at goal for his diabetes.  4. Nonrheumatic mitral valve regurgitation -Secondary to cardiomyopathy.  Continue to monitor.  5. Persistent atrial fibrillation ([facility]) -Status post cardioversion.  On amiodarone 200 mg daily for rhythm control strategy.  Tolerating this well.  He will need a yearly chest x-ray as well as eye exam.  Also will need thyroid studies yearly. -He will continue Eliquis 2.5 mg twice daily for stroke prophylaxis.  No issues with bleeding with this medication.  He is on a reduced dose given his kidney function as well as age.  I  Disposition: Return in about 6 months (around 10/30/2020).  Medication Adjustments/Labs and Tests Ordered: Current medicines are reviewed at length with the patient today.  Concerns regarding medicines are outlined above.  No orders of the defined types were placed in this encounter.  No orders of the defined types were placed in this encounter.   Patient Instructions  Medication Instructions:  The current medical regimen is effective;  continue present plan and medications.  *If you need a refill on your cardiac medications before your next appointment, please call your pharmacy*   Follow-Up: At [facility], you and your health needs are our priority.  As part of our continuing mission to provide you with exceptional heart care, we have created designated Provider Care Teams.  These Care Teams include your primary Cardiologist  (physician) and Advanced Practice Providers (APPs -  Physician Assistants and Nurse Practitioners) who all work together to provide you with the care you need, when you need it.  We  recommend signing up for the patient portal called "MyChart".  Sign up information is provided on this After Visit Summary.  MyChart is used to connect with patients for Virtual Visits (Telemedicine).  Patients are able to view lab/test results, encounter notes, upcoming appointments, etc.  Non-urgent messages can be sent to your provider as well.   To learn more about what you can do with MyChart, go to [email or web address].    Your next appointment:   6 month(s)  The format for your next appointment:   In Person  Provider:   Eleonore Chiquito, MD        Time Spent with Patient: I have spent a total of 25 minutes with patient reviewing hospital notes, telemetry, EKGs, labs and examining the patient as well as establishing an assessment and plan that was discussed with the patient.  > 50% of time was spent in direct patient care.  Signed, Addison Naegeli. Audie Box, MD, [place]  [address], [place], [place] [address] [phone]  04/30/2020 10:25 AM

## 2020-04-30 ENCOUNTER — Encounter: Payer: Self-pay | Admitting: Cardiovascular Disease

## 2020-04-30 ENCOUNTER — Other Ambulatory Visit: Payer: Self-pay

## 2020-04-30 ENCOUNTER — Ambulatory Visit: Payer: Medicare HMO | Admitting: Cardiovascular Disease

## 2020-04-30 VITALS — BP 136/60 | HR 55 | Ht 71.0 in | Wt 187.0 lb

## 2020-04-30 DIAGNOSIS — E782 Mixed hyperlipidemia: Secondary | ICD-10-CM | POA: Diagnosis not present

## 2020-04-30 DIAGNOSIS — I340 Nonrheumatic mitral (valve) insufficiency: Secondary | ICD-10-CM

## 2020-04-30 DIAGNOSIS — I4819 Other persistent atrial fibrillation: Secondary | ICD-10-CM

## 2020-04-30 DIAGNOSIS — I5022 Chronic systolic (congestive) heart failure: Secondary | ICD-10-CM | POA: Diagnosis not present

## 2020-04-30 DIAGNOSIS — I447 Left bundle-branch block, unspecified: Secondary | ICD-10-CM | POA: Diagnosis not present

## 2020-04-30 NOTE — Patient Instructions (Signed)
Medication Instructions:  The current medical regimen is effective;  continue present plan and medications.  *If you need a refill on your cardiac medications before your next appointment, please call your pharmacy*   Follow-Up: At [facility], you and your health needs are our priority.  As part of our continuing mission to provide you with exceptional heart care, we have created designated Provider Care Teams.  These Care Teams include your primary Cardiologist (physician) and Advanced Practice Providers (APPs -  Physician Assistants and Nurse Practitioners) who all work together to provide you with the care you need, when you need it.  We recommend signing up for the patient portal called "MyChart".  Sign up information is provided on this After Visit Summary.  MyChart is used to connect with patients for Virtual Visits (Telemedicine).  Patients are able to view lab/test results, encounter notes, upcoming appointments, etc.  Non-urgent messages can be sent to your provider as well.   To learn more about what you can do with MyChart, go to [email or web address].    Your next appointment:   6 month(s)  The format for your next appointment:   In Person  Provider:   Dibble O'Neal, MD      

## 2020-05-15 ENCOUNTER — Other Ambulatory Visit: Payer: Self-pay | Admitting: Physician Assistant

## 2020-05-15 DIAGNOSIS — R0989 Other specified symptoms and signs involving the circulatory and respiratory systems: Secondary | ICD-10-CM

## 2020-05-17 ENCOUNTER — Other Ambulatory Visit: Payer: Self-pay

## 2020-05-17 ENCOUNTER — Ambulatory Visit
Admission: RE | Admit: 2020-05-17 | Discharge: 2020-05-17 | Disposition: A | Discharge status: Home / Self Care | Payer: Medicare HMO | Source: Ambulatory Visit | Attending: Physician Assistant | Admitting: Physician Assistant

## 2020-05-17 DIAGNOSIS — R0989 Other specified symptoms and signs involving the circulatory and respiratory systems: Secondary | ICD-10-CM

## 2020-06-08 ENCOUNTER — Other Ambulatory Visit: Payer: Self-pay | Admitting: Physician Assistant

## 2020-06-08 DIAGNOSIS — N289 Disorder of kidney and ureter, unspecified: Secondary | ICD-10-CM

## 2020-06-15 ENCOUNTER — Ambulatory Visit
Admission: RE | Admit: 2020-06-15 | Discharge: 2020-06-15 | Disposition: A | Discharge status: Home / Self Care | Payer: Medicare HMO | Source: Ambulatory Visit | Attending: Physician Assistant | Admitting: Physician Assistant

## 2020-06-15 DIAGNOSIS — N289 Disorder of kidney and ureter, unspecified: Secondary | ICD-10-CM

## 2020-06-20 ENCOUNTER — Other Ambulatory Visit: Payer: Medicare HMO

## 2020-06-21 ENCOUNTER — Telehealth: Payer: Self-pay | Admitting: Cardiovascular Disease

## 2020-06-21 NOTE — Telephone Encounter (Signed)
PT is calling to confirm that his paperwork for his handicap sticker was received and signed.

## 2020-06-21 NOTE — Telephone Encounter (Signed)
Called patient left message on personal voice mail Dr.O'Neal's nurse is out of office this week.I will send message to her.

## 2020-06-25 NOTE — Telephone Encounter (Signed)
Called patient, advised that I had received the forms, I would have to wait for Dr.O'Neal to return to sign and we would call for him to come pick up.  Patient verbalized understanding, thankful for call back.

## 2020-07-03 NOTE — Telephone Encounter (Signed)
Called patient, advised that forms were signed and ready to be picked up.  Patient will be by tomorrow.  Will place up front in the bin.

## 2020-07-16 ENCOUNTER — Telehealth: Payer: Self-pay | Admitting: Cardiovascular Disease

## 2020-07-16 MED ORDER — LOSARTAN POTASSIUM 100 MG PO TABS: 100.0000 mg | ORAL_TABLET | Freq: Every day | ORAL | 3 refills | Status: DC

## 2020-07-16 NOTE — Telephone Encounter (Signed)
*  STAT* If patient is at the pharmacy, call can be transferred to refill team.   1. Which medications need to be refilled? (please list name of each medication and dose if known) Losartan 100 mg  2. Which pharmacy/location (including street and city if local pharmacy) is medication to be sent to? [facility]  3. Do they need a 30 day or 90 day supply? [place]

## 2020-08-12 ENCOUNTER — Other Ambulatory Visit: Payer: Self-pay | Admitting: Cardiovascular Disease

## 2020-09-06 ENCOUNTER — Other Ambulatory Visit: Payer: Self-pay | Admitting: Cardiovascular Disease

## 2020-09-12 ENCOUNTER — Other Ambulatory Visit: Payer: Self-pay | Admitting: Cardiovascular Disease

## 2020-09-12 NOTE — Telephone Encounter (Signed)
Prescription refill request for Eliquis received. Indication:AFIB  Last office visit:O'NEAL 04/30/20 Scr:1.95 06/13/20 Age: 54M Weight:84.8KG

## 2020-09-29 ENCOUNTER — Other Ambulatory Visit: Payer: Self-pay | Admitting: Cardiovascular Disease

## 2020-10-13 ENCOUNTER — Other Ambulatory Visit: Payer: Self-pay

## 2020-10-13 ENCOUNTER — Emergency Department (HOSPITAL_COMMUNITY): Payer: Medicare HMO

## 2020-10-13 ENCOUNTER — Observation Stay (HOSPITAL_COMMUNITY)
Admission: EM | Admit: 2020-10-13 | Discharge: 2020-10-14 | Disposition: A | Discharge status: Home / Self Care | Payer: Medicare HMO | Attending: Internal Medicine | Admitting: Internal Medicine

## 2020-10-13 ENCOUNTER — Encounter (HOSPITAL_COMMUNITY): Payer: Self-pay | Admitting: Emergency Medicine

## 2020-10-13 DIAGNOSIS — I110 Hypertensive heart disease with heart failure: Secondary | ICD-10-CM | POA: Insufficient documentation

## 2020-10-13 DIAGNOSIS — N179 Acute kidney failure, unspecified: Secondary | ICD-10-CM

## 2020-10-13 DIAGNOSIS — E119 Type 2 diabetes mellitus without complications: Secondary | ICD-10-CM

## 2020-10-13 DIAGNOSIS — I4819 Other persistent atrial fibrillation: Secondary | ICD-10-CM

## 2020-10-13 DIAGNOSIS — Z79899 Other long term (current) drug therapy: Secondary | ICD-10-CM | POA: Insufficient documentation

## 2020-10-13 DIAGNOSIS — I200 Unstable angina: Principal | ICD-10-CM | POA: Insufficient documentation

## 2020-10-13 DIAGNOSIS — Z20822 Contact with and (suspected) exposure to covid-19: Secondary | ICD-10-CM | POA: Insufficient documentation

## 2020-10-13 DIAGNOSIS — I214 Non-ST elevation (NSTEMI) myocardial infarction: Secondary | ICD-10-CM | POA: Diagnosis present

## 2020-10-13 DIAGNOSIS — R0602 Shortness of breath: Secondary | ICD-10-CM | POA: Diagnosis present

## 2020-10-13 DIAGNOSIS — I10 Essential (primary) hypertension: Secondary | ICD-10-CM

## 2020-10-13 DIAGNOSIS — I509 Heart failure, unspecified: Secondary | ICD-10-CM | POA: Insufficient documentation

## 2020-10-13 DIAGNOSIS — Z87891 Personal history of nicotine dependence: Secondary | ICD-10-CM | POA: Insufficient documentation

## 2020-10-13 DIAGNOSIS — N1832 Chronic kidney disease, stage 3b: Secondary | ICD-10-CM

## 2020-10-13 DIAGNOSIS — Z7901 Long term (current) use of anticoagulants: Secondary | ICD-10-CM | POA: Insufficient documentation

## 2020-10-13 HISTORY — DX: Nonrheumatic mitral (valve) insufficiency: I34.0

## 2020-10-13 HISTORY — DX: Left bundle-branch block, unspecified: I44.7

## 2020-10-13 HISTORY — DX: Chronic kidney disease, stage 3 unspecified: N18.30

## 2020-10-13 HISTORY — DX: Paroxysmal atrial fibrillation: I48.0

## 2020-10-13 LAB — BASIC METABOLIC PANEL
Anion gap: 8 (ref 5–15)
BUN: 44 mg/dL — ABNORMAL HIGH (ref 8–23)
CO2: 23 mmol/L (ref 22–32)
Calcium: 8.8 mg/dL — ABNORMAL LOW (ref 8.9–10.3)
Chloride: 106 mmol/L (ref 98–111)
Creatinine, Ser: 2.04 mg/dL — ABNORMAL HIGH (ref 0.61–1.24)
GFR, Estimated: 31 mL/min — ABNORMAL LOW (ref >60)
Glucose, Bld: 225 mg/dL — ABNORMAL HIGH (ref 70–99)
Potassium: 5.1 mmol/L (ref 3.5–5.1)
Sodium: 137 mmol/L (ref 135–145)

## 2020-10-13 LAB — RESP PANEL BY RT-PCR (FLU A&B, COVID) ARPGX2
Influenza A by PCR: NEGATIVE
Influenza B by PCR: NEGATIVE
SARS Coronavirus 2 by RT PCR: NEGATIVE

## 2020-10-13 LAB — PROTIME-INR
INR: 1.2 (ref 0.8–1.2)
Prothrombin Time: 14.8 seconds (ref 11.4–15.2)

## 2020-10-13 LAB — CBC
HCT: 29.1 % — ABNORMAL LOW (ref 39.0–52.0)
Hemoglobin: 9.4 g/dL — ABNORMAL LOW (ref 13.0–17.0)
MCH: 36 pg — ABNORMAL HIGH (ref 26.0–34.0)
MCHC: 32.3 g/dL (ref 30.0–36.0)
MCV: 111.5 fL — ABNORMAL HIGH (ref 80.0–100.0)
Platelets: 136 10*3/uL — ABNORMAL LOW (ref 150–400)
RBC: 2.61 MIL/uL — ABNORMAL LOW (ref 4.22–5.81)
RDW: 17.2 % — ABNORMAL HIGH (ref 11.5–15.5)
WBC: 6.1 10*3/uL (ref 4.0–10.5)
nRBC: 0 % (ref 0.0–0.2)

## 2020-10-13 LAB — HEPARIN LEVEL (UNFRACTIONATED): Heparin Unfractionated: >1.1 IU/mL — ABNORMAL HIGH (ref 0.30–0.70)

## 2020-10-13 LAB — TROPONIN I (HIGH SENSITIVITY)
Troponin I (High Sensitivity): 146 ng/L (ref <18)
Troponin I (High Sensitivity): 1734 ng/L (ref <18)

## 2020-10-13 LAB — APTT: aPTT: 31 seconds (ref 24–36)

## 2020-10-13 MED ORDER — POLYMYXIN B-TRIMETHOPRIM 10000-0.1 UNIT/ML-% OP SOLN
1.0000 [drp] | Freq: Three times a day (TID) | OPHTHALMIC | Status: DC
  Administered 2020-10-13 – 2020-10-14 (×2): 1 [drp] via OPHTHALMIC
  Filled 2020-10-13: qty 10

## 2020-10-13 MED ORDER — EMPAGLIFLOZIN 10 MG PO TABS
10.0000 mg | ORAL_TABLET | Freq: Every day | ORAL | Status: DC
  Administered 2020-10-14: 10 mg via ORAL
  Filled 2020-10-13: qty 1

## 2020-10-13 MED ORDER — ACETAMINOPHEN 325 MG PO TABS: 650.0000 mg | ORAL_TABLET | ORAL | Status: DC | PRN

## 2020-10-13 MED ORDER — HEPARIN (PORCINE) 25000 UT/250ML-% IV SOLN
1100.0000 [IU]/h | INTRAVENOUS | Status: DC
  Administered 2020-10-13: 950 [IU]/h via INTRAVENOUS
  Filled 2020-10-13 (×2): qty 250

## 2020-10-13 MED ORDER — ASPIRIN 300 MG RE SUPP
300.0000 mg | RECTAL | Status: DC
Start: 2020-10-13 — End: 2020-10-14
  Filled 2020-10-13: qty 1

## 2020-10-13 MED ORDER — AMIODARONE HCL 200 MG PO TABS
100.0000 mg | ORAL_TABLET | Freq: Every day | ORAL | Status: DC
  Administered 2020-10-13 – 2020-10-14 (×2): 100 mg via ORAL
  Filled 2020-10-13 (×2): qty 1

## 2020-10-13 MED ORDER — ASPIRIN 81 MG PO CHEW
324.0000 mg | CHEWABLE_TABLET | ORAL | Status: DC
Start: 2020-10-13 — End: 2020-10-14

## 2020-10-13 MED ORDER — SIMVASTATIN 20 MG PO TABS
20.0000 mg | ORAL_TABLET | Freq: Every evening | ORAL | Status: DC
  Administered 2020-10-13: 20 mg via ORAL
  Filled 2020-10-13: qty 1

## 2020-10-13 MED ORDER — CARVEDILOL 12.5 MG PO TABS
12.5000 mg | ORAL_TABLET | Freq: Two times a day (BID) | ORAL | Status: DC
  Administered 2020-10-13 – 2020-10-14 (×2): 12.5 mg via ORAL
  Filled 2020-10-13 (×2): qty 1

## 2020-10-13 MED ORDER — FUROSEMIDE 20 MG PO TABS
20.0000 mg | ORAL_TABLET | Freq: Every day | ORAL | Status: DC
  Administered 2020-10-13: 20 mg via ORAL
  Filled 2020-10-13: qty 1

## 2020-10-13 MED ORDER — NITROGLYCERIN 0.4 MG SL SUBL: 0.4000 mg | SUBLINGUAL_TABLET | SUBLINGUAL | Status: DC | PRN

## 2020-10-13 MED ORDER — SPIRONOLACTONE 12.5 MG HALF TABLET
12.5000 mg | ORAL_TABLET | Freq: Every day | ORAL | Status: DC
  Administered 2020-10-13: 12.5 mg via ORAL
  Filled 2020-10-13: qty 1

## 2020-10-13 MED ORDER — ONDANSETRON HCL 4 MG/2ML IJ SOLN: 4.0000 mg | Freq: Four times a day (QID) | INTRAMUSCULAR | Status: DC | PRN

## 2020-10-13 MED ORDER — ASPIRIN EC 81 MG PO TBEC
81.0000 mg | DELAYED_RELEASE_TABLET | Freq: Every day | ORAL | Status: DC
  Administered 2020-10-14: 81 mg via ORAL
  Filled 2020-10-13 (×2): qty 1

## 2020-10-13 NOTE — Progress Notes (Signed)
Received to 6E 27 via stretcher from ED. Denies CP, SOB or any other c/o at this time. Ambulated in room to bed from stretcher. Tolerated well. Oriented to room/ unit. Call bell in reach. Tele box 6E [id number] verified x 2 with monitor tech. Encouraged to call for needs OOB etc. Verbalized understanding.

## 2020-10-13 NOTE — ED Triage Notes (Signed)
Pt to triage via [facility] from home.  Reports SOB and chest pressure that started at 7:30am and lasted 30-45 min.  Took NTG that expired 20 years ago.  States pain and SOB resolved at rest.  Had episode of SOB 2-3 weeks ago that lasted approx 30 min.  18g LAC.

## 2020-10-13 NOTE — ED Notes (Addendum)
Cardiology at bedside.

## 2020-10-13 NOTE — ED Notes (Signed)
Notified Lockwood MD about patient's recent troponin of 1,734.

## 2020-10-13 NOTE — H&P (Signed)
Cardiology Admission History and Physical:   Patient ID: ZAEVION KENOYER MRN: [id number]; DOB: 07/19/1931   Admission date: 10/13/2020  PCP:  Heywood Bene, PA-C   [place] [facility] Cardiologist:  Dr Charolette Child   Chief Complaint:  chest pain  Patient Profile:   Stanley Moore is a 87 y.o. male with chronic systolic dysfunction, LBBB, DM, and HTN who is being seen 10/13/2020 for the evaluation of chest pain.  History of Present Illness:   [name] presents with chest tightness and SOB.  He reports having an initial episode 3 weeks ago which spontaneously resolved.   He did well until early this am when he developed SOB and chest tightness.  He took an old sl NTG belonging to his spouse.  His symptoms resolved initially but then returned.  He took a second sl NTG without relief.  He presented to the ER for further evaluation.  His symptoms have since improved.  He has a chronically depressed EF.  He has sinus bradycardia, first degree AV block and LBBB.  He is followed closely by Dr Marisue Ivan.  He has previously preferred a noninvasive approach to healthcare.  He had a myoview 06/30/19 which was low risk.    Past Medical History:  Diagnosis Date   CHF (congestive heart failure) (HCC)    CRI (chronic renal insufficiency), stage 3 (moderate) (HCC)    Diabetes mellitus without complication (HCC)    Hyperlipidemia    Hypertension    LBBB (left bundle branch block)    Moderate mitral regurgitation    Paroxysmal atrial fibrillation [facility])     Past Surgical History:  Procedure Laterality Date   appendicitis     CARDIOVERSION N/A 06/27/2019   Procedure: CARDIOVERSION;  Surgeon: Elouise Munroe, MD;  Location: [facility] ENDOSCOPY;  Service: Cardiovascular;  Laterality: N/A;   MOLE REMOVAL       Medications Prior to Admission: Prior to Admission medications   Medication Sig Start Date End Date Taking? Authorizing Provider  amiodarone (PACERONE) 100 MG tablet TAKE  1 TABLET BY MOUTH EVERY DAY 10/01/20  Yes O'Neal, Cassie Freer, MD  apixaban (ELIQUIS) 2.5 MG TABS tablet TAKE 1 TABLET BY MOUTH TWICE A DAY 09/12/20  Yes O'Neal, Cassie Freer, MD  carvedilol (COREG) 12.5 MG tablet TAKE 1 TABLET BY MOUTH TWICE A DAY 08/13/20  Yes O'Neal, Cassie Freer, MD  Cayenne 450 MG CAPS Take 450 mg by mouth daily.   Yes [provider]  furosemide (LASIX) 20 MG tablet TAKE 1 TABLET BY MOUTH EVERY DAY Patient taking differently: Take 20 mg by mouth at bedtime. 09/06/20  Yes O'Neal, Cassie Freer, MD  Garlic Oil 123XX123 MG CAPS Take 1,000 mg by mouth daily.    Yes [provider]  JARDIANCE 10 MG TABS tablet TAKE 1 TABLET BY MOUTH EVERY DAY WITH BREAKFAST Patient taking differently: Take 10 mg by mouth daily. 04/09/20  Yes O'Neal, Cassie Freer, MD  Multiple Vitamins-Minerals (MULTIVITAMIN WITH MINERALS) tablet Take 1 tablet by mouth daily. Iron   Yes [provider]  Oil of Oregano 1500 MG CAPS Take 1,500 mg by mouth daily.   Yes [provider]  simvastatin (ZOCOR) 20 MG tablet Take 1 tablet (20 mg total) by mouth every evening. 02/13/20  Yes O'Neal, Cassie Freer, MD  spironolactone (ALDACTONE) 25 MG tablet TAKE 1/2 TABLET ONCE A DAY Patient taking differently: Take 12.5 mg by mouth at bedtime. 04/02/20  Yes O'Neal, Cassie Freer, MD  trimethoprim-polymyxin b [facility]) ophthalmic  solution Place 1 drop into the right eye in the morning, at noon, and at bedtime. 09/03/20  Yes [provider]  Turmeric 500 MG TABS Take 500 mg by mouth daily.    Yes [provider]  Vitamin D3 (VITAMIN D) 25 MCG tablet Take 1,000 Units by mouth daily.   Yes [provider]  losartan (COZAAR) 100 MG tablet Take 1 tablet (100 mg total) by mouth daily. Patient not taking: Reported on 10/13/2020 07/16/20 10/14/20  Stanley Rile, MD     Allergies:   No Known Allergies  Social History:   Social History   Socioeconomic History   Marital  status: Married    Spouse name: Not on file   Number of children: 3   Years of education: Not on file   Highest education level: Not on file  Occupational History   Not on file  Tobacco Use   Smoking status: Former    Years: 5.00    Types: Cigarettes   Smokeless tobacco: Never  Substance and Sexual Activity   Alcohol use: Yes    Comment: wine with dinner   Drug use: No   Sexual activity: Not on file  Other Topics Concern   Not on file  Social History Narrative   Not on file   Social Determinants of Health   Financial Resource Strain: Not on file  Food Insecurity: Not on file  Transportation Needs: Not on file  Physical Activity: Not on file  Stress: Not on file  Social Connections: Not on file  Intimate Partner Violence: Not on file    Family History:   The patient's family history includes Diabetes in his mother.    ROS:  Please see the history of present illness.  All other ROS reviewed and negative.     Physical Exam/Data:   Vitals:   10/13/20 1545 10/13/20 1600 10/13/20 1615 10/13/20 1630  BP: 100/88 (!) 121/56 130/73 122/70  Pulse: (!) 51 (!) 52 68 (!) 59  Resp: '19 19 20 18  '$ Temp:      TempSrc:      SpO2: 100% 91% 94% 100%  Weight:      Height:       No intake or output data in the 24 hours ending 10/13/20 1659 Last 3 Weights 10/13/2020 04/30/2020 12/26/2019  Weight (lbs) 176 lb 187 lb 191 lb 6.4 oz  Weight (kg) 79.833 kg 84.823 kg 86.818 kg     Body mass index is 24.55 kg/m.  General:  elderly, pleasant HEENT: normal Neck: no JVD Cardiac: RRR Lungs:  clear to auscultation bilaterally, no wheezing, rhonchi or rales  Abd: soft, nontender, no hepatomegaly  Ext: no edema Musculoskeletal:  diffuse atrophy Skin: warm and dry  Neuro:  CNs 2-12 intact, no focal abnormalities noted Psych:  Normal affect    EKG:  The ECG that was done and was personally reviewed and demonstrates sinus rhythm with first degree AV block, LBBB  Relevant CV  Studies: Myoview 2021 as above  Laboratory Data:  High Sensitivity Troponin:   Recent Labs  Lab 10/13/20 1108 10/13/20 1405  TROPONINIHS 146* 1,734*      Chemistry Recent Labs  Lab 10/13/20 1108  NA 137  K 5.1  CL 106  CO2 23  GLUCOSE 225*  BUN 44*  CREATININE 2.04*  CALCIUM 8.8*  GFRNONAA 31*  ANIONGAP 8    No results for input(s): PROT, ALBUMIN, AST, ALT, ALKPHOS, BILITOT in the last 168 hours. Lipids No  results for input(s): CHOL, TRIG, HDL, LABVLDL, LDLCALC, CHOLHDL in the last 168 hours. Hematology Recent Labs  Lab 10/13/20 1108  WBC 6.1  RBC 2.61*  HGB 9.4*  HCT 29.1*  MCV 111.5*  MCH 36.0*  MCHC 32.3  RDW 17.2*  PLT 136*   Thyroid No results for input(s): TSH, FREET4 in the last 168 hours. BNPNo results for input(s): BNP, PROBNP in the last 168 hours.  DDimer No results for input(s): DDIMER in the last 168 hours.   Radiology/Studies:  DG Chest 2 View  Result Date: 10/13/2020 CLINICAL DATA:  87 year old male with history of chest pain. EXAM: CHEST - 2 VIEW COMPARISON:  Chest x-ray 07/27/2019. FINDINGS: Lung volumes are normal. Widespread areas of interstitial prominence are noted throughout the lungs bilaterally, most evident in the mid to lower lungs. No consolidative airspace disease. No pleural effusions. No pneumothorax. No pulmonary nodule or mass noted. Pulmonary vasculature and the cardiomediastinal silhouette are within normal limits. Atherosclerosis in the thoracic aorta. IMPRESSION: 1. The appearance of the lungs may suggest interstitial lung disease. Further evaluation with nonemergent high-resolution chest CT is recommended in the near future to better evaluate these findings. 2. Aortic atherosclerosis. Electronically Signed   By: Vinnie Langton M.D.   On: 10/13/2020 11:34     Assessment and Plan:   NSTEMI The patient presents with cardiac symptoms worrisome for ACS.  Given advanced age, renal failure, and chronic CHF, he has opted for  conservative measures chronically.  We will admit for further evaluation and management.  Start ASA Hold eliquis and start heparin drip.     Continue coreg and start imdur '30mg'$  daily. If we can manage conservatively then he would like to avoid cath.  If he continues to have symptoms, then we may have to reconsider.  2. Persistent afib Well controlled with amiodarone Hold eliquis for now, treat with heparin drip  3. HTN Stable No change required today  4. Acute on chronic renal failure, stage III We will continue home medicines.  If creatinine worsens or we decide to pursue cath then we will stop diuresis/ spiro.  5. AV conduction system disease I would not advise increase coreg or adding any additional AV nodal agents  6. Chronic systolic dysfunction with MR Conservative management as above   Severity of Illness: The appropriate patient status for this patient is INPATIENT. Inpatient status is judged to be reasonable and necessary in order to provide the required intensity of service to ensure the patient's safety. The patient's presenting symptoms, physical exam findings, and initial radiographic and laboratory data in the context of their chronic comorbidities is felt to place them at high risk for further clinical deterioration. Furthermore, it is not anticipated that the patient will be medically stable for discharge from the hospital within 2 midnights of admission. The following factors support the patient status of inpatient.   " The patient's presenting symptoms include unstable angina. " The worrisome physical exam findings include advanced age and fragility. " The initial radiographic and laboratory data are worrisome because of elevated trop and creatinine. " The chronic co-morbidities include advanced age, renal failure, afib, CHF.   * I certify that at the point of admission it is my clinical judgment that the patient will require inpatient hospital care spanning  beyond 2 midnights from the point of admission due to high intensity of service, high risk for further deterioration and high frequency of surveillance required.*   For questions or updates, please contact [facility]  Please consult [email or web address] for contact info under     Signed, Thompson Grayer, MD  10/13/2020 4:59 PM

## 2020-10-13 NOTE — ED Provider Notes (Signed)
Emergency Medicine Provider Triage Evaluation Note  Stanley Moore , a 87 y.o. male  was evaluated in triage.  Pt complains of shortness of breath and chest pressure that began approximately at 7:30 AM this morning and lasted for proxy 45 minutes.  Has taking nitro that expired 20 years ago.  Worse the pain has now resolved.  Has had similar shortness of breath episodes lasting 30 minutes for the past 2 to 3 weeks.  Is followed by Dr. Davina Poke, does report a prior history of likely an MI.  Last evaluated by him approximately 6 months ago.  Symptoms have now resolved.  Review of Systems  Positive: Chest pain, shortness of breath Negative: Fever, cough,   Physical Exam  BP (!) 110/43 (BP Location: Left Arm)   Pulse (!) 59   Temp 97.7 F (36.5 C) (Oral)   Resp 17   SpO2 100%  Gen:   Awake, no distress   Resp:  Normal effort  MSK:   Moves extremities without difficulty  Other:  Overall well-appearing, under no distress.  Medical Decision Making  Medically screening exam initiated at 11:15 AM.  Appropriate orders placed.  MARTIZE LARY was informed that the remainder of the evaluation will be completed by another provider, this initial triage assessment does not replace that evaluation, and the importance of remaining in the ED until their evaluation is complete.     Janeece Fitting, PA-C 10/13/20 1118    Carmin Muskrat, MD 10/13/20 1600

## 2020-10-13 NOTE — Progress Notes (Signed)
ANTICOAGULATION CONSULT NOTE - Initial Consult  Pharmacy Consult for Heparin Indication: chest pain/ACS  No Known Allergies  Patient Measurements: Height: '5\' 11"'$  (180.3 cm) Weight: 79.8 kg (176 lb) IBW/kg (Calculated) : 75.3 Heparin Dosing Weight: 79.8 kg  Vital Signs: Temp: 97.6 F (36.4 C) (10/08 1323) Temp Source: Oral (10/08 1113) BP: 132/54 (10/08 1530) Pulse Rate: 51 (10/08 1530)  Labs: Recent Labs    10/13/20 1108 10/13/20 1405  HGB 9.4*  --   HCT 29.1*  --   PLT 136*  --   CREATININE 2.04*  --   TROPONINIHS 146* 1,734*    Estimated Creatinine Clearance: 27.7 mL/min (A) (by C-G formula based on SCr of 2.04 mg/dL (H)).   Medical History: Past Medical History:  Diagnosis Date   CHF (congestive heart failure) ([place])    Diabetes mellitus without complication (HCC)    Heart murmur    Hyperlipidemia    Hypertension     Medications:  (Not in a hospital admission)  Scheduled:  Infusions:  PRN:   Assessment: 20 yom with a history of CHF, hypertension, A. fib. Patient is presenting with SOB. Heparin per pharmacy consult placed for chest pain/ACS.  Patient is on apixaban prior to arrival. Last dose 10/8 0900. Will require aPTT monitoring due to likely falsely high anti-Xa level secondary to DOAC use.  Hgb 9.4; plt 136  Goal of Therapy:  Heparin level 0.3-0.7 units/ml Heparin level 66-102 units/ml Monitor platelets by anticoagulation protocol: Yes   Plan:  No initial heparin bolus Start heparin infusion at 950 units/hr at 2100 10/8 Check aPTT & anti-Xa level in 8 hours and daily while on heparin Continue to monitor via aPTT until levels are correlated Continue to monitor H&H and platelets  Lorelei Pont, PharmD, BCPS 10/13/2020 3:48 PM ED Clinical Pharmacist -  [phone]

## 2020-10-13 NOTE — ED Provider Notes (Signed)
[facility] EMERGENCY DEPARTMENT Provider Note   CSN: [id number] Arrival date & time: 10/13/20  1103     History Chief Complaint  Patient presents with   Shortness of Breath    Stanley Moore is a 87 y.o. male.  HPI Patient presents with concern of episodic chest tightness, shortness of breath.  He is here with his daughter and his wife who assist with history. He has multiple medical issues including CHF, hypertension, A. fib.  He is on Eliquis.  3 weeks ago he had a brief episode similar to when he experienced today.  Today there was a 45-minute episode of dyspnea, chest tightness, fatigue.  Symptoms were worse with exertion, better at rest and currently are not present.  No concurrent fever, syncope, vomiting.  He has been taking all medication as directed.  Last cardiac evaluation was about 1 year ago.    Past Medical History:  Diagnosis Date   CHF (congestive heart failure) ([place])    Diabetes mellitus without complication ([place])    Heart murmur    Hyperlipidemia    Hypertension     Patient Active Problem List   Diagnosis Date Noted   Persistent atrial fibrillation ([place])    Unspecified essential hypertension 04/06/2012   Pure hypercholesterolemia 04/06/2012   Type II or unspecified type diabetes mellitus without mention of complication, not stated as uncontrolled 04/06/2012    Past Surgical History:  Procedure Laterality Date   appendicitis     CARDIOVERSION N/A 06/27/2019   Procedure: CARDIOVERSION;  Surgeon: Elouise Munroe, MD;  Location: [facility] ENDOSCOPY;  Service: Cardiovascular;  Laterality: N/A;   MOLE REMOVAL         Family History  Problem Relation Age of Onset   Diabetes Mother     Social History   Tobacco Use   Smoking status: Former    Years: 5.00    Types: Cigarettes   Smokeless tobacco: Never  Substance Use Topics   Alcohol use: Yes    Comment: wine with dinner   Drug use: No    Home Medications Prior to  Admission medications   Medication Sig Start Date End Date Taking? Authorizing Provider  amiodarone (PACERONE) 100 MG tablet TAKE 1 TABLET BY MOUTH EVERY DAY 10/01/20  Yes O'Neal, Cassie Freer, MD  apixaban (ELIQUIS) 2.5 MG TABS tablet TAKE 1 TABLET BY MOUTH TWICE A DAY 09/12/20  Yes O'Neal, Cassie Freer, MD  carvedilol (COREG) 12.5 MG tablet TAKE 1 TABLET BY MOUTH TWICE A DAY 08/13/20  Yes O'Neal, Cassie Freer, MD  Cayenne 450 MG CAPS Take 450 mg by mouth daily.   Yes [provider]  furosemide (LASIX) 20 MG tablet TAKE 1 TABLET BY MOUTH EVERY DAY Patient taking differently: Take 20 mg by mouth at bedtime. 09/06/20  Yes O'Neal, Cassie Freer, MD  Garlic Oil [id number] MG CAPS Take 1,000 mg by mouth daily.    Yes [provider]  JARDIANCE 10 MG TABS tablet TAKE 1 TABLET BY MOUTH EVERY DAY WITH BREAKFAST Patient taking differently: Take 10 mg by mouth daily. 04/09/20  Yes O'Neal, Cassie Freer, MD  Multiple Vitamins-Minerals (MULTIVITAMIN WITH MINERALS) tablet Take 1 tablet by mouth daily. Iron   Yes [provider]  Oil of Oregano 1500 MG CAPS Take 1,500 mg by mouth daily.   Yes [provider]  simvastatin (ZOCOR) 20 MG tablet Take 1 tablet (20 mg total) by mouth every evening. 02/13/20  Yes O'Neal, Cassie Freer, MD  spironolactone (ALDACTONE)  25 MG tablet TAKE 1/2 TABLET ONCE A DAY Patient taking differently: Take 12.5 mg by mouth at bedtime. 04/02/20  Yes O'Neal, Cassie Freer, MD  trimethoprim-polymyxin b (POLYTRIM) ophthalmic solution Place 1 drop into the right eye in the morning, at noon, and at bedtime. 09/03/20  Yes [provider]  Turmeric 500 MG TABS Take 500 mg by mouth daily.    Yes [provider]  Vitamin D3 (VITAMIN D) 25 MCG tablet Take 1,000 Units by mouth daily.   Yes [provider]  losartan (COZAAR) 100 MG tablet Take 1 tablet (100 mg total) by mouth daily. Patient not taking: Reported on 10/13/2020 07/16/20 10/14/20  Geralynn Rile, MD    Allergies    Patient has no known allergies.  Review of Systems   Review of Systems  Constitutional:        Per HPI, otherwise negative  HENT:         Per HPI, otherwise negative  Respiratory:         Per HPI, otherwise negative  Cardiovascular:        Per HPI, otherwise negative  Gastrointestinal:  Negative for vomiting.  Endocrine:       Negative aside from HPI  Genitourinary:        Neg aside from HPI   Musculoskeletal:        Per HPI, otherwise negative  Skin: Negative.   Neurological:  Negative for syncope.   Physical Exam Updated Vital Signs BP (!) 132/54   Pulse (!) 51   Temp 97.6 F (36.4 C)   Resp 19   Ht '5\' 11"'$  (1.803 m)   Wt 79.8 kg   SpO2 99%   BMI 24.55 kg/m   Physical Exam Vitals and nursing note reviewed.  Constitutional:      General: He is not in acute distress.    Appearance: He is well-developed.  HENT:     Head: Normocephalic and atraumatic.  Eyes:     Conjunctiva/sclera: Conjunctivae normal.  Cardiovascular:     Rate and Rhythm: Normal rate and regular rhythm.  Pulmonary:     Effort: Pulmonary effort is normal. No respiratory distress.     Breath sounds: No stridor.  Abdominal:     General: There is no distension.  Skin:    General: Skin is warm and dry.  Neurological:     Mental Status: He is alert and oriented to person, place, and time.    ED Results / Procedures / Treatments   Labs (all labs ordered are listed, but only abnormal results are displayed) Labs Reviewed  BASIC METABOLIC PANEL - Abnormal; Notable for the following components:      Result Value   Glucose, Bld 225 (*)    BUN 44 (*)    Creatinine, Ser 2.04 (*)    Calcium 8.8 (*)    GFR, Estimated 31 (*)    All other components within normal limits  CBC - Abnormal; Notable for the following components:   RBC 2.61 (*)    Hemoglobin 9.4 (*)    HCT 29.1 (*)    MCV 111.5 (*)    MCH 36.0 (*)    RDW 17.2 (*)    Platelets 136 (*)    All  other components within normal limits  TROPONIN I (HIGH SENSITIVITY) - Abnormal; Notable for the following components:   Troponin I (High Sensitivity) 146 (*)    All other components within normal limits  TROPONIN I (HIGH SENSITIVITY) -  Abnormal; Notable for the following components:   Troponin I (High Sensitivity) 1,734 (*)    All other components within normal limits  RESP PANEL BY RT-PCR (FLU A&B, COVID) [id number]    EKG EKG Interpretation  Date/Time:  Saturday October 13 2020 11:15:34 EDT Ventricular Rate:  60 PR Interval:  304 QRS Duration: 120 QT Interval:  462 QTC Calculation: 462 R Axis:   146 Text Interpretation: Suspect arm lead reversal, interpretation assumes no reversal Sinus rhythm with 1st degree A-V block Right axis deviation Pulmonary disease pattern Non-specific intra-ventricular conduction delay ST-t wave abnormality No significant change since last tracing Abnormal ECG Confirmed by Carmin Muskrat [phone]) on 10/13/2020 12:07:50 PM  Radiology DG Chest 2 View  Result Date: 10/13/2020 CLINICAL DATA:  87 year old male with history of chest pain. EXAM: CHEST - 2 VIEW COMPARISON:  Chest x-ray 07/27/2019. FINDINGS: Lung volumes are normal. Widespread areas of interstitial prominence are noted throughout the lungs bilaterally, most evident in the mid to lower lungs. No consolidative airspace disease. No pleural effusions. No pneumothorax. No pulmonary nodule or mass noted. Pulmonary vasculature and the cardiomediastinal silhouette are within normal limits. Atherosclerosis in the thoracic aorta. IMPRESSION: 1. The appearance of the lungs may suggest interstitial lung disease. Further evaluation with nonemergent high-resolution chest CT is recommended in the near future to better evaluate these findings. 2. Aortic atherosclerosis. Electronically Signed   By: Vinnie Langton M.D.   On: 10/13/2020 11:34    Procedures Procedures   Medications Ordered in ED Medications - No data to  display  ED Course  I have reviewed the triage vital signs and the nursing notes.  Pertinent labs & imaging results that were available during my care of the patient were reviewed by me and considered in my medical decision making (see chart for details). Date: Initial troponin 146.  3:41 PM Second opponent 1750.  I discussed this case with our cardiology colleagues.  With concern for unstable angina given his exertional chest pain ordered today, elevated troponin value, though he is currently asymptomatic, patient will require admission for monitoring, management with our cardiology team.   MDM Rules/Calculators/A&P MDM Number of Diagnoses or Management Options Unstable angina ([place]): new, needed workup   Amount and/or Complexity of Data Reviewed Clinical lab tests: ordered and reviewed Tests in the radiology section of CPT: ordered and reviewed Tests in the medicine section of CPT: reviewed and ordered Decide to obtain previous medical records or to obtain history from someone other than the patient: yes Obtain history from someone other than the patient: yes Review and summarize past medical records: yes Discuss the patient with other providers: yes Independent visualization of images, tracings, or specimens: yes  Risk of Complications, Morbidity, and/or Mortality Presenting problems: high Diagnostic procedures: high Management options: high  Critical Care Total time providing critical care: 30-74 minutes (40)  Patient Progress Patient progress: stable   Final Clinical Impression(s) / ED Diagnoses Final diagnoses:  Unstable angina (HCC)       Carmin Muskrat, MD 10/13/20 1600

## 2020-10-14 DIAGNOSIS — I4819 Other persistent atrial fibrillation: Secondary | ICD-10-CM | POA: Diagnosis not present

## 2020-10-14 DIAGNOSIS — I200 Unstable angina: Secondary | ICD-10-CM | POA: Diagnosis not present

## 2020-10-14 DIAGNOSIS — I110 Hypertensive heart disease with heart failure: Secondary | ICD-10-CM | POA: Diagnosis not present

## 2020-10-14 DIAGNOSIS — I10 Essential (primary) hypertension: Secondary | ICD-10-CM | POA: Diagnosis not present

## 2020-10-14 DIAGNOSIS — I214 Non-ST elevation (NSTEMI) myocardial infarction: Secondary | ICD-10-CM | POA: Diagnosis not present

## 2020-10-14 DIAGNOSIS — I519 Heart disease, unspecified: Secondary | ICD-10-CM

## 2020-10-14 DIAGNOSIS — Z20822 Contact with and (suspected) exposure to covid-19: Secondary | ICD-10-CM | POA: Diagnosis not present

## 2020-10-14 LAB — BASIC METABOLIC PANEL
Anion gap: 11 (ref 5–15)
BUN: 45 mg/dL — ABNORMAL HIGH (ref 8–23)
CO2: 20 mmol/L — ABNORMAL LOW (ref 22–32)
Calcium: 8.7 mg/dL — ABNORMAL LOW (ref 8.9–10.3)
Chloride: 107 mmol/L (ref 98–111)
Creatinine, Ser: 1.85 mg/dL — ABNORMAL HIGH (ref 0.61–1.24)
GFR, Estimated: 35 mL/min — ABNORMAL LOW (ref >60)
Glucose, Bld: 150 mg/dL — ABNORMAL HIGH (ref 70–99)
Potassium: 4.5 mmol/L (ref 3.5–5.1)
Sodium: 138 mmol/L (ref 135–145)

## 2020-10-14 LAB — HEPARIN LEVEL (UNFRACTIONATED): Heparin Unfractionated: 0.89 IU/mL — ABNORMAL HIGH (ref 0.30–0.70)

## 2020-10-14 LAB — HEMOGLOBIN A1C
Hgb A1c MFr Bld: 6.7 % — ABNORMAL HIGH (ref 4.8–5.6)
Mean Plasma Glucose: 145.59 mg/dL

## 2020-10-14 LAB — LIPID PANEL
Cholesterol: 109 mg/dL (ref 0–200)
HDL: 33 mg/dL — ABNORMAL LOW (ref >40)
LDL Cholesterol: 53 mg/dL (ref 0–99)
Total CHOL/HDL Ratio: 3.3 RATIO
Triglycerides: 115 mg/dL (ref <150)
VLDL: 23 mg/dL (ref 0–40)

## 2020-10-14 LAB — APTT: aPTT: 37 seconds — ABNORMAL HIGH (ref 24–36)

## 2020-10-14 MED ORDER — ISOSORBIDE MONONITRATE ER 30 MG PO TB24: 30.0000 mg | ORAL_TABLET | Freq: Every day | ORAL | 6 refills | Status: DC

## 2020-10-14 MED ORDER — ASPIRIN 81 MG PO TBEC: 81.0000 mg | DELAYED_RELEASE_TABLET | Freq: Every day | ORAL | 11 refills | Status: DC

## 2020-10-14 MED ORDER — NITROGLYCERIN 0.4 MG SL SUBL: 0.4000 mg | SUBLINGUAL_TABLET | SUBLINGUAL | 12 refills | Status: AC | PRN

## 2020-10-14 MED ORDER — ISOSORBIDE MONONITRATE ER 30 MG PO TB24
30.0000 mg | ORAL_TABLET | Freq: Every day | ORAL | Status: DC
  Administered 2020-10-14: 30 mg via ORAL
  Filled 2020-10-14: qty 1

## 2020-10-14 NOTE — Progress Notes (Signed)
ANTICOAGULATION CONSULT NOTE   Pharmacy Consult for Heparin Indication: chest pain/ACS, afib  No Known Allergies  Patient Measurements: Height: '5\' 11"'$  (180.3 cm) Weight: 79.8 kg (176 lb) IBW/kg (Calculated) : 75.3 Heparin Dosing Weight: 79.8 kg  Vital Signs: Temp: 97.9 F (36.6 C) (10/08 2341) Temp Source: Oral (10/08 2341) BP: 103/58 (10/08 2341) Pulse Rate: 55 (10/08 2341)  Labs: Recent Labs    10/13/20 1108 10/13/20 1405 10/13/20 1623 10/14/20 0140  HGB 9.4*  --   --   --   HCT 29.1*  --   --   --   PLT 136*  --   --   --   APTT  --   --  31 37*  LABPROT  --   --  14.8  --   INR  --   --  1.2  --   HEPARINUNFRC  --   --  >1.10* 0.89*  CREATININE 2.04*  --   --  1.85*  TROPONINIHS 146* 1,734*  --   --      Estimated Creatinine Clearance: 30.5 mL/min (A) (by C-G formula based on SCr of 1.85 mg/dL (H)).   Medical History: Past Medical History:  Diagnosis Date   CHF (congestive heart failure) (HCC)    CRI (chronic renal insufficiency), stage 3 (moderate) (HCC)    Diabetes mellitus without complication (HCC)    Hyperlipidemia    Hypertension    LBBB (left bundle branch block)    Moderate mitral regurgitation    Paroxysmal atrial fibrillation (HCC)     Medications:  Medications Prior to Admission  Medication Sig Dispense Refill Last Dose   amiodarone (PACERONE) 100 MG tablet TAKE 1 TABLET BY MOUTH EVERY DAY 90 tablet 2 UNK at UNK   apixaban (ELIQUIS) 2.5 MG TABS tablet TAKE 1 TABLET BY MOUTH TWICE A DAY 180 tablet 1 10/13/2020 at 0900   carvedilol (COREG) 12.5 MG tablet TAKE 1 TABLET BY MOUTH TWICE A DAY 180 tablet 3 10/13/2020 at 0900   Cayenne 450 MG CAPS Take 450 mg by mouth daily.   10/13/2020   furosemide (LASIX) 20 MG tablet TAKE 1 TABLET BY MOUTH EVERY DAY (Patient taking differently: Take 20 mg by mouth at bedtime.) 90 tablet 3 [id number]   Garlic Oil 123XX123 MG CAPS Take 1,000 mg by mouth daily.    10/13/2020   JARDIANCE 10 MG TABS tablet TAKE 1 TABLET BY  MOUTH EVERY DAY WITH BREAKFAST (Patient taking differently: Take 10 mg by mouth daily.) 90 tablet 2 UNK at UNK   Multiple Vitamins-Minerals (MULTIVITAMIN WITH MINERALS) tablet Take 1 tablet by mouth daily. Iron   10/13/2020   Oil of Oregano 1500 MG CAPS Take 1,500 mg by mouth daily.   10/13/2020   simvastatin (ZOCOR) 20 MG tablet Take 1 tablet (20 mg total) by mouth every evening. 90 tablet 3 10/12/2020   spironolactone (ALDACTONE) 25 MG tablet TAKE 1/2 TABLET ONCE A DAY (Patient taking differently: Take 12.5 mg by mouth at bedtime.) 45 tablet 9 10/12/2020   trimethoprim-polymyxin b (POLYTRIM) ophthalmic solution Place 1 drop into the right eye in the morning, at noon, and at bedtime.   10/13/2020   Turmeric 500 MG TABS Take 500 mg by mouth daily.    10/13/2020   Vitamin D3 (VITAMIN D) 25 MCG tablet Take 1,000 Units by mouth daily.   10/13/2020   losartan (COZAAR) 100 MG tablet Take 1 tablet (100 mg total) by mouth daily. (Patient not taking: Reported on 10/13/2020) 90  tablet 3 Not Taking    Assessment: 21 yom with a history of CHF, hypertension, A. fib. Patient is presenting with SOB. Heparin per pharmacy consult placed for chest pain/ACS.  Patient is on apixaban prior to arrival. Last dose 10/8 0900. Will require aPTT monitoring due to likely falsely high anti-Xa level secondary to DOAC use.  Hgb 9.4; plt 136  10/9 AM update:  aPTT low  Goal of Therapy:  Heparin level 0.3-0.7 units/ml Heparin level 66-102 secs Monitor platelets by anticoagulation protocol: Yes   Plan:  Inc heparin to 1100 units/hr 1200 heparin level and aPTT Continue to monitor via aPTT until levels are correlated Continue to monitor H&H and platelets  Narda Bonds, PharmD, BCPS Clinical Pharmacist Phone: [phone]

## 2020-10-14 NOTE — Discharge Summary (Signed)
Discharge Summary    Patient ID: CLOID VOSSLER MRN: [id number]; DOB: November 29, 1931  Admit date: 10/13/2020 Discharge date: 10/14/2020  PCP:  Heywood Bene, PA-C   [facility] Providers Cardiologist:  Evalina Field, MD  Electrophysiologist:  Thompson Grayer, MD       Discharge Diagnoses    Active Problems:   NSTEMI (non-ST elevated myocardial infarction) [facility])    Diagnostic Studies/Procedures     _____________   History of Present Illness     Stanley Moore is a 87 y.o. male with chronic systolic dysfunction, LBBB, DM, and HTN who was admitted 10/13/2020 for chest pain >> NSTEMI by enzymes and atrial fib.  Hospital Course     Consultants: None   1.  NSTEMI He was admitted 10/08 and remained Clinically stable overnight He felt better 10/09 and is very clear that he wishes to avoid cath.   Using a shared decision making process with risks and benefits discussed, he is clear in his decision to decline cath or further inpatient management at this time. We will start imdur '30mg'$  daily. Given advanced age, a conservative approach may be best He declines ASA or other antiplatelet agent. Once he has ambulated up to chair with no sx, ok to DC to home with close follow-up by Dr Marisue Ivan.  Has appt 10/27   2. Persistent afib Initially, his Eliquis was held and he was put on a heparin drip for possible cath Since no cath planned, restart Eliquis Continue amiodarone '100mg'$  daily as per Dr Audie Box   3. HTN His Losartan 100 mg was held on admission, he had not been taking this at home. His Aldactone 12.5 mg qhs, Lasix 20 mg qhs and Coreg 12.5 mg bid were continued.  SBP is 100s-110s on current meds. Continue these home meds, plus Imdur.   4. Acute on chronic stage IIIb renal failure Cr elevated on admit to 2.04 Cr was 1.53 December 2021 but was 1.81 in January 2022 Appears to be returning to baseline   5. AV conduction system disease Dr Rayann Heman recommends no  increase in Coreg and not adding any additional AV nodal agents   6. Chronic systolic dysfunction with MR Conservative management as above Volume status ok at wt approx. 180 lbs  Did the patient have an acute coronary syndrome (MI, NSTEMI, STEMI, etc) this admission?:  Yes                               AHA/ACC Clinical Performance & Quality Measures: Aspirin prescribed? - No - refused ADP Receptor Inhibitor (Plavix/Clopidogrel, Brilinta/Ticagrelor or Effient/Prasugrel) prescribed (includes medically managed patients)? - No - refused Beta Blocker prescribed? - Yes High Intensity Statin (Lipitor 40-'80mg'$  or Crestor 20-'40mg'$ ) prescribed? - No - pt preference EF assessed during THIS hospitalization? - No - pt refused For EF <40%, was ACEI/ARB prescribed? - No - pt refused For EF <40%, Aldosterone Antagonist (Spironolactone or Eplerenone) prescribed? - No - pt refused Cardiac Rehab Phase II ordered (including medically managed patients)? - No - pt refused  ___  Discharge Vitals Blood pressure (!) 118/55, pulse 66, temperature 97.7 F (36.5 C), temperature source Oral, resp. rate 19, height '5\' 11"'$  (1.803 m), weight 80.4 kg, SpO2 99 %.  Filed Weights   10/13/20 1354 10/14/20 0518  Weight: 79.8 kg 80.4 kg    Labs & Radiologic Studies    CBC Recent Labs    10/13/20 1108  WBC  6.1  HGB 9.4*  HCT 29.1*  MCV 111.5*  PLT [id number]*   Basic Metabolic Panel Recent Labs    10/13/20 1108 10/14/20 0140  NA 137 138  K 5.1 4.5  CL 106 107  CO2 23 20*  GLUCOSE 225* 150*  BUN 44* 45*  CREATININE 2.04* 1.85*  CALCIUM 8.8* 8.7*   Liver Function Tests No results for input(s): AST, ALT, ALKPHOS, BILITOT, PROT, ALBUMIN in the last 72 hours. No results for input(s): LIPASE, AMYLASE in the last 72 hours. High Sensitivity Troponin:   Recent Labs  Lab 10/13/20 1108 10/13/20 1405  TROPONINIHS 146* 1,734*    BNP Invalid input(s): POCBNP D-Dimer No results for input(s): DDIMER in the last 72  hours. Hemoglobin A1C Recent Labs    10/14/20 0140  HGBA1C 6.7*   Fasting Lipid Panel Recent Labs    10/14/20 0140  CHOL 109  HDL 33*  LDLCALC 53  TRIG 115  CHOLHDL 3.3   Thyroid Function Tests No results for input(s): TSH, T4TOTAL, T3FREE, THYROIDAB in the last 72 hours.  Invalid input(s): FREET3 _____________  DG Chest 2 View  Result Date: 10/13/2020 CLINICAL DATA:  87 year old male with history of chest pain. EXAM: CHEST - 2 VIEW COMPARISON:  Chest x-ray 07/27/2019. FINDINGS: Lung volumes are normal. Widespread areas of interstitial prominence are noted throughout the lungs bilaterally, most evident in the mid to lower lungs. No consolidative airspace disease. No pleural effusions. No pneumothorax. No pulmonary nodule or mass noted. Pulmonary vasculature and the cardiomediastinal silhouette are within normal limits. Atherosclerosis in the thoracic aorta. IMPRESSION: 1. The appearance of the lungs may suggest interstitial lung disease. Further evaluation with nonemergent high-resolution chest CT is recommended in the near future to better evaluate these findings. 2. Aortic atherosclerosis. Electronically Signed   By: Vinnie Langton M.D.   On: 10/13/2020 11:34   Disposition   Pt is being discharged home today in stable condition.  Follow-up Plans & Appointments     Follow-up Information     O'Neal, Cassie Freer, MD Follow up.   Specialties: Cardiology, Internal Medicine, Radiology Why: keep appointment 10/27 Contact information: [place] [address] [phone]                Discharge Instructions     (HEART FAILURE PATIENTS) Call MD:  Anytime you have any of the following symptoms: 1) 3 pound weight gain in 24 hours or 5 pounds in 1 week 2) shortness of breath, with or without a dry hacking cough 3) swelling in the hands, feet or stomach 4) if you have to sleep on extra pillows at night in order to breathe.   Complete by: As directed     Diet - low sodium heart healthy   Complete by: As directed    Diet Carb Modified   Complete by: As directed    Increase activity slowly   Complete by: As directed        Discharge Medications   Allergies as of 10/14/2020   No Known Allergies      Medication List     STOP taking these medications    losartan 100 MG tablet Commonly known as: COZAAR       TAKE these medications    amiodarone 100 MG tablet Commonly known as: PACERONE TAKE 1 TABLET BY MOUTH EVERY DAY   carvedilol 12.5 MG tablet Commonly known as: COREG TAKE 1 TABLET BY MOUTH TWICE A DAY   Cayenne 450 MG Caps Take  450 mg by mouth daily.   Eliquis 2.5 MG Tabs tablet Generic drug: apixaban TAKE 1 TABLET BY MOUTH TWICE A DAY   furosemide 20 MG tablet Commonly known as: LASIX TAKE 1 TABLET BY MOUTH EVERY DAY What changed: when to take this   Garlic Oil [id number] MG Caps Take 1,000 mg by mouth daily.   isosorbide mononitrate 30 MG 24 hr tablet Commonly known as: IMDUR Take 1 tablet (30 mg total) by mouth daily.   Jardiance 10 MG Tabs tablet Generic drug: empagliflozin TAKE 1 TABLET BY MOUTH EVERY DAY WITH BREAKFAST What changed: See the new instructions.   multivitamin with minerals tablet Take 1 tablet by mouth daily. Iron   nitroGLYCERIN 0.4 MG SL tablet Commonly known as: NITROSTAT Place 1 tablet (0.4 mg total) under the tongue every 5 (five) minutes x 3 doses as needed for chest pain.   Oil of Oregano 1500 MG Caps Take 1,500 mg by mouth daily.   simvastatin 20 MG tablet Commonly known as: ZOCOR Take 1 tablet (20 mg total) by mouth every evening.   spironolactone 25 MG tablet Commonly known as: ALDACTONE TAKE 1/2 TABLET ONCE A DAY What changed: See the new instructions.   trimethoprim-polymyxin b ophthalmic solution Commonly known as: POLYTRIM Place 1 drop into the right eye in the morning, at noon, and at bedtime.   Turmeric 500 MG Tabs Take 500 mg by mouth daily.   Vitamin  D3 25 MCG tablet Commonly known as: Vitamin D Take 1,000 Units by mouth daily.           Outstanding Labs/Studies   None  Duration of Discharge Encounter   Greater than 30 minutes including physician time.  Signed, Rosaria Ferries, PA-C 10/14/2020, 10:07 AM

## 2020-10-14 NOTE — Progress Notes (Signed)
    RN spoke w/ pt, he is now willing to take ASA 81 mg qd.  Updated AVS.  Rosaria Ferries, PA-C 10/14/2020 11:57 AM

## 2020-10-14 NOTE — Progress Notes (Signed)
Progress Note   Subjective   Doing well today, the patient denies CP or SOB.  He states "I feel great!  I want to go home!".  No new concerns  Inpatient Medications    Scheduled Meds:  amiodarone  100 mg Oral Daily   aspirin  324 mg Oral NOW   Or   aspirin  300 mg Rectal NOW   aspirin EC  81 mg Oral Daily   carvedilol  12.5 mg Oral BID   empagliflozin  10 mg Oral Daily   furosemide  20 mg Oral QHS   isosorbide mononitrate  30 mg Oral Daily   simvastatin  20 mg Oral QPM   spironolactone  12.5 mg Oral QHS   trimethoprim-polymyxin b  1 drop Right Eye TID   Continuous Infusions:  heparin 1,100 Units/hr (10/14/20 0404)   PRN Meds: acetaminophen, nitroGLYCERIN, ondansetron (ZOFRAN) IV   Vital Signs    Vitals:   10/13/20 2146 10/13/20 2341 10/14/20 0518 10/14/20 0815  BP: (!) 114/52 (!) 103/58 (!) 108/51 (!) 118/55  Pulse: 68 (!) 55 (!) 53 66  Resp:  18 19   Temp:  97.9 F (36.6 C) 98.2 F (36.8 C) 97.7 F (36.5 C)  TempSrc:  Oral Oral Oral  SpO2:  96% 99%   Weight:   80.4 kg   Height:        Intake/Output Summary (Last 24 hours) at 10/14/2020 0927 Last data filed at 10/14/2020 0813 Gross per 24 hour  Intake 499.13 ml  Output --  Net 499.13 ml   Filed Weights   10/13/20 1354 10/14/20 0518  Weight: 79.8 kg 80.4 kg    Telemetry    Sinus bradycardia - Personally Reviewed  Physical Exam   GEN- The patient is well appearing, alert and oriented x 3 today.   Head- normocephalic, atraumatic Eyes-  Sclera clear, conjunctiva pink Ears- hearing intact Oropharynx- clear Neck- supple, Lungs-  normal work of breathing Heart- Regular rate and rhythm  GI- soft  Extremities- no clubbing, cyanosis, or edema  MS- no significant deformity or atrophy Skin- no rash or lesion Psych- euthymic mood, full affect Neuro- strength and sensation are intact   Labs    Chemistry Recent Labs  Lab 10/13/20 1108 10/14/20 0140  NA 137 138  K 5.1 4.5  CL 106 107  CO2 23  20*  GLUCOSE 225* 150*  BUN 44* 45*  CREATININE 2.04* 1.85*  CALCIUM 8.8* 8.7*  GFRNONAA 31* 35*  ANIONGAP 8 11     Hematology Recent Labs  Lab 10/13/20 1108  WBC 6.1  RBC 2.61*  HGB 9.4*  HCT 29.1*  MCV 111.5*  MCH 36.0*  MCHC 32.3  RDW 17.2*  PLT 136*     Patient ID  Stanley Moore is a 87 y.o. male with chronic systolic dysfunction, LBBB, DM, and HTN who is being seen 10/13/2020 for the evaluation of chest pain.  Assessment & Plan    1.  NSTEMI Clinically stable overnight He feels better today and is very cleawr that he wishes to avoid cath.  Using a shared decision making process with risks and benefits discussed, he is clear in his decision to decline cath or further inpatient management at this time. We will start imdur '30mg'$  daily at discharge. Given advanced age, a conservative approach may be best He declines ASA. Would ambulate/ up to chair.  If no further issues, will DC to home with close follow-up by Dr Marisue Ivan.  2.  Persistent afib On heparin drip Return to eliquis if no cath planned Continue amiodarone '100mg'$  daily as per Dr Marisue Ivan  3. HTN Stable No change required today  4. Acute on chronic stage IIIb renal failure Appears to be returning to baseline  5. AV conduction system disease I would not advise increase coreg or adding any additional AV nodal agents   6. Chronic systolic dysfunction with MR Conservative management as above  DC to home with close outpatient follow-up.  Thompson Grayer MD, [facility] 10/14/2020 9:27 AM

## 2020-10-14 NOTE — Progress Notes (Signed)
Ambulated in hallway x 50 feet. Tolerated well. Denies SOB, CP or other c/o.

## 2020-10-14 NOTE — Plan of Care (Signed)

## 2020-10-14 NOTE — Care Management CC44 (Signed)
Condition Code 44 Documentation Completed  Patient Details  Name: Stanley Moore MRN: [id number] Date of Birth: [birth year]   Condition Code 44 given:  Yes Patient signature on Condition Code 44 notice:  Yes Documentation of 2 MD's agreement:  Yes Code 44 added to claim:  Yes    Carles Collet, RN 10/14/2020, 10:35 AM

## 2020-10-14 NOTE — Care Management Obs Status (Signed)
[facility] NOTIFICATION   Patient Details  Name: Stanley Moore MRN: [id number] Date of Birth: [birth year]   Medicare Observation Status Notification Given:  Yes    Carles Collet, RN 10/14/2020, 10:35 AM

## 2020-10-28 NOTE — Progress Notes (Signed)
Cardiology Office Note:   Date:  11/01/2020  NAME:  DEMONT Moore    MRN: [id number] DOB:  Aug 10, 1931   PCP:  Heywood Bene, PA-C  Cardiologist:  Evalina Field, MD  Electrophysiologist:  Thompson Grayer, MD   Referring MD: Heywood Bene, *   Chief Complaint  Patient presents with   Follow-up         History of Present Illness:   Stanley Moore is a 87 y.o. male with a hx of pAF, DM, CKD3, CHF, CAD who presents for follow-up. Admitted in August for NSTEMI. Did not want cath and managed medically.  He presents for follow-up.  Symptoms or chest tightness.  He reports heavy breathing as well.  Nitroglycerin made him feel better.  Symptoms continues to progress and he went to the hospital.  He did rule in for non-ST elevation myocardial infarction.  He refused cardiac cath as there was inherent risk to damage his kidneys.  He was treated medically.  He is on Plavix.  Losartan was stopped by his primary care physician.  Needs to be on hydralazine.  His cholesterol was well controlled.  He denies any further chest tightness episodes.  He attributes this to Eliquis.  His weights are stable.  He informs me he is short of breath with very minimal activity.  No lower extremity edema.  Does not appear to be volume overload.  Echocardiogram was not performed in the hospital.  I do have concerns his heart function may worsen.  I suspect he may have underlying triple-vessel CAD.  This is commonly missed on nuclear medicine stress testing.  Blood pressure well controlled.  He presents with his wife.  We discussed options for him.  He still desires a conservative approach.  He is not interested in invasive angiography as this would risk his kidney function.  He is not interested in dialysis either.  For now we will continue with medical therapy.  Maintaining sinus rhythm.  On amiodarone.  Denies any chest pain in office.  Problem List 1. HTN 2. HLD -T chol 109, HDL 33, LDL 53,  triglycerides 115 3. DM -A1c 6.7 4. CKD 3 - EGFR 36 5. Persistent Afib -DCCV 06/27/2019 6. Systolic HF, 89-21% -normal MPI -EF 12/15/2019 -> 25% -only interested in medical management  7. 1AVB -354 msec 8. LBBB -QRS 166 ms 9. Moderate MR -ERO 0.15 cm2, R vol 32 cc, RF 40% 10. CAD -NSTEMI 10/13/2020 -managed medically (patient preference)  Past Medical History: Past Medical History:  Diagnosis Date   CHF (congestive heart failure) ([place])    CRI (chronic renal insufficiency), stage 3 (moderate) (HCC)    Diabetes mellitus without complication (HCC)    Hyperlipidemia    Hypertension    LBBB (left bundle branch block)    Moderate mitral regurgitation    Paroxysmal atrial fibrillation (HCC)     Past Surgical History: Past Surgical History:  Procedure Laterality Date   appendicitis     CARDIOVERSION N/A 06/27/2019   Procedure: CARDIOVERSION;  Surgeon: Elouise Munroe, MD;  Location: [facility];  Service: Cardiovascular;  Laterality: N/A;   MOLE REMOVAL      Current Medications: Current Meds  Medication Sig   amiodarone (PACERONE) 100 MG tablet TAKE 1 TABLET BY MOUTH EVERY DAY   apixaban (ELIQUIS) 2.5 MG TABS tablet TAKE 1 TABLET BY MOUTH TWICE A DAY   Cayenne 450 MG CAPS Take 450 mg by mouth daily.   clopidogrel (PLAVIX) 75 MG  tablet Take 1 tablet (75 mg total) by mouth daily.   furosemide (LASIX) 20 MG tablet TAKE 1 TABLET BY MOUTH EVERY DAY (Patient taking differently: Take 20 mg by mouth at bedtime.)   Garlic Oil 1308 MG CAPS Take 1,000 mg by mouth daily.    hydrALAZINE (APRESOLINE) 25 MG tablet Take 1 tablet (25 mg total) by mouth 3 (three) times daily.   JARDIANCE 10 MG TABS tablet TAKE 1 TABLET BY MOUTH EVERY DAY WITH BREAKFAST (Patient taking differently: Take 10 mg by mouth daily.)   Multiple Vitamins-Minerals (MULTIVITAMIN WITH MINERALS) tablet Take 1 tablet by mouth daily. Iron   nitroGLYCERIN (NITROSTAT) 0.4 MG SL tablet Place 1 tablet (0.4 mg total) under  the tongue every 5 (five) minutes x 3 doses as needed for chest pain.   Oil of Oregano 1500 MG CAPS Take 1,500 mg by mouth daily.   trimethoprim-polymyxin b (POLYTRIM) ophthalmic solution Place 1 drop into the right eye in the morning, at noon, and at bedtime.   Turmeric 500 MG TABS Take 500 mg by mouth daily.    Vitamin D3 (VITAMIN D) 25 MCG tablet Take 1,000 Units by mouth daily.   [DISCONTINUED] aspirin EC 81 MG EC tablet Take 1 tablet (81 mg total) by mouth daily. Swallow whole.   [DISCONTINUED] carvedilol (COREG) 12.5 MG tablet TAKE 1 TABLET BY MOUTH TWICE A DAY   [DISCONTINUED] isosorbide mononitrate (IMDUR) 30 MG 24 hr tablet Take 1 tablet (30 mg total) by mouth daily.   [DISCONTINUED] simvastatin (ZOCOR) 20 MG tablet Take 1 tablet (20 mg total) by mouth every evening.   [DISCONTINUED] spironolactone (ALDACTONE) 25 MG tablet TAKE 1/2 TABLET ONCE A DAY (Patient taking differently: Take 12.5 mg by mouth at bedtime.)     Allergies:    Patient has no known allergies.   Social History: Social History   Socioeconomic History   Marital status: Married    Spouse name: Not on file   Number of children: 3   Years of education: Not on file   Highest education level: Not on file  Occupational History   Not on file  Tobacco Use   Smoking status: Former    Years: 5.00    Types: Cigarettes   Smokeless tobacco: Never  Substance and Sexual Activity   Alcohol use: Yes    Comment: wine with dinner   Drug use: No   Sexual activity: Not on file  Other Topics Concern   Not on file  Social History Narrative   Not on file   Social Determinants of Health   Financial Resource Strain: Not on file  Food Insecurity: Not on file  Transportation Needs: Not on file  Physical Activity: Not on file  Stress: Not on file  Social Connections: Not on file     Family History: The patient's family history includes Diabetes in his mother.  ROS:   All other ROS reviewed and negative. Pertinent  positives noted in the HPI.     EKGs/Labs/Other Studies Reviewed:   The following studies were personally reviewed by me today:  TTE 12/15/2019  1. Diffuse hypokinesis with inferior basal akinesis and ischemic MR. Left  ventricular ejection fraction, by estimation, is 25 to 30%. The left  ventricle has severely decreased function. The left ventricle demonstrates  global hypokinesis. The left  ventricular internal cavity size was moderately dilated. Left ventricular  diastolic parameters were normal.   2. Right ventricular systolic function is normal. The right ventricular  size is  normal.   3. Left atrial size was moderately dilated.   4. Right atrial size was moderately dilated.   5. The mitral valve is degenerative. Moderate mitral valve regurgitation.  No evidence of mitral stenosis.   6. The aortic valve is tricuspid. Aortic valve regurgitation is not  visualized. Mild to moderate aortic valve sclerosis/calcification is  present, without any evidence of aortic stenosis.   7. The inferior vena cava is normal in size with greater than 50%  respiratory variability, suggesting right atrial pressure of 3 mmHg.   Recent Labs: 10/13/2020: Hemoglobin 9.4; Platelets 136 10/14/2020: BUN 45; Creatinine, Ser 1.85; Potassium 4.5; Sodium 138   Recent Lipid Panel    Component Value Date/Time   CHOL 109 10/14/2020 0140   TRIG 115 10/14/2020 0140   HDL 33 (L) 10/14/2020 0140   CHOLHDL 3.3 10/14/2020 0140   VLDL 23 10/14/2020 0140   LDLCALC 53 10/14/2020 0140   LDLDIRECT 80.7 12/16/2012 1228    Physical Exam:   VS:  BP (!) 142/66   Pulse 81   Ht 5' 11"  (1.803 m)   Wt 181 lb 9.6 oz (82.4 kg)   SpO2 95%   BMI 25.33 kg/m    Wt Readings from Last 3 Encounters:  11/01/20 181 lb 9.6 oz (82.4 kg)  10/14/20 177 lb 4 oz (80.4 kg)  04/30/20 187 lb (84.8 kg)    General: Well nourished, well developed, in no acute distress Head: Atraumatic, normal size  Eyes: PEERLA, EOMI  Neck: Supple, no  JVD Endocrine: No thryomegaly Cardiac: Normal S1, S2; RRR; no murmurs, rubs, or gallops Lungs: Clear to auscultation bilaterally, no wheezing, rhonchi or rales  Abd: Soft, nontender, no hepatomegaly  Ext: No edema, pulses 2+ Musculoskeletal: No deformities, BUE and BLE strength normal and equal Skin: Warm and dry, no rashes   Neuro: Alert and oriented to person, place, time, and situation, CNII-XII grossly intact, no focal deficits  Psych: Normal mood and affect   ASSESSMENT:   FARMER MCCAHILL is a 87 y.o. male who presents for the following: 1. Coronary artery disease involving native coronary artery of native heart without angina pectoris   2. Chronic systolic heart failure ([place])   3. LBBB (left bundle branch block)   4. Mixed hyperlipidemia   5. Nonrheumatic mitral valve regurgitation   6. Persistent atrial fibrillation ([place])     PLAN:   1. Coronary artery disease involving native coronary artery of native heart without angina pectoris -Admitted to the hospital on 10/13/2020.  Was having chest tightness symptoms.  Ruled in for non-ST elevation myocardial infarction in the hospital.  He was treated medically.  He refused cardiac catheterization.  He reports he did not want to risk his kidney function.  He felt better with medical therapy.  He has been sent home on Imdur.  No further chest tightness symptoms.  We will switch him to Plavix.  Stop aspirin.  Would like for him to be on this for at least 1 year.  Since he is on Eliquis we will just pursue a single antiplatelet agent.  He is on simvastatin.  LDL cholesterol immaculate.  Continue with this. -He continues to express shortness of breath.  We will repeat an echocardiogram.  No evidence of volume overload.  I highly suspect his heart function is worse.  I really believe he likely has underlying triple-vessel disease.  Given that his nuclear medicine stress test was normal I believe multivessel disease or significant left main  disease will be missed on SPECT imaging.  We discussed this in the office today.  He continues to express a desire for a conservative approach.  He is not interested in cardiac cath.  For now we will continue with medical therapy. -He is also on a beta-blocker.  2. Chronic systolic heart failure ([place]) -He was diagnosed with systolic heart failure in 2021.  This was in the setting of A. fib as well as left bundle branch block.  EF has not improved spite medical therapy.  Remains depressed.  Now with non-STEMI.  We will repeat an echocardiogram. -Suspect he likely has an ischemic cardiomyopathy.  Not interested in invasive angiography. -Taken off losartan due to kidney function.  We will stop Aldactone as well.  He has CKD stage IIIb.  We will continue with Coreg 12.5 mg twice daily, Imdur 30 mg daily.  We will add hydralazine 25 mg 3 times daily.  We will continue with conservative approach not risk his kidneys. -He is on Jardiance 10 mg daily.  Euvolemic on exam today.  Continue with Lasix 20 mg daily. -We have had several discussions about his risk of sudden cardiac death.  However he is 47.  I believe we will forego ICD at this time.  He is in agreement.  Again he is really only interested in a very conservative approach. -He also is DNR.  Does not want heroic or aggressive measures.  I am in agreement with this.  3. LBBB (left bundle branch block) -Chronic.  Stable.  Not interested in CRT pacing.  4. Mixed hyperlipidemia -LDL well controlled.  Continue statin.  5. Nonrheumatic mitral valve regurgitation -Mild to moderate MR.  Likely secondary to cardiomyopathy.  Conservative approach.  Continue with medical therapy.  6. Persistent atrial fibrillation ([facility]) -Status post cardioversion.  Given low EF we pursued rhythm control strategy with amiodarone.  Tolerating this well.  He is on 100 mg daily.  He is also on Eliquis 2.5 mg twice daily.  He will continue this.  No major bleeding issues.      Disposition: Return in about 2 months (around 01/01/2021).  Medication Adjustments/Labs and Tests Ordered: Current medicines are reviewed at length with the patient today.  Concerns regarding medicines are outlined above.  Orders Placed This Encounter  Procedures   ECHOCARDIOGRAM COMPLETE   Meds ordered this encounter  Medications   clopidogrel (PLAVIX) 75 MG tablet    Sig: Take 1 tablet (75 mg total) by mouth daily.    Dispense:  90 tablet    Refill:  3   hydrALAZINE (APRESOLINE) 25 MG tablet    Sig: Take 1 tablet (25 mg total) by mouth 3 (three) times daily.    Dispense:  270 tablet    Refill:  3   isosorbide mononitrate (IMDUR) 30 MG 24 hr tablet    Sig: Take 1 tablet (30 mg total) by mouth daily.    Dispense:  30 tablet    Refill:  6   simvastatin (ZOCOR) 20 MG tablet    Sig: Take 1 tablet (20 mg total) by mouth every evening.    Dispense:  90 tablet    Refill:  3   carvedilol (COREG) 12.5 MG tablet    Sig: Take 1 tablet (12.5 mg total) by mouth 2 (two) times daily.    Dispense:  180 tablet    Refill:  3    Patient Instructions  Medication Instructions:  Stop Aldactone  Start Hydralazine 25 mg three times  daily  Stop Aspirin Start Plavix 75 mg daily   *If you need a refill on your cardiac medications before your next appointment, please call your pharmacy*   Testing/Procedures: Echocardiogram - Your physician has requested that you have an echocardiogram. Echocardiography is a painless test that uses sound waves to create images of your heart. It provides your doctor with information about the size and shape of your heart and how well your heart's chambers and valves are working. This procedure takes approximately one hour. There are no restrictions for this procedure. This will be performed at our [facility] location - [address].    Follow-Up: At [facility], you and your health needs are our priority.  As part of our continuing mission to  provide you with exceptional heart care, we have created designated Provider Care Teams.  These Care Teams include your primary Cardiologist (physician) and Advanced Practice Providers (APPs -  Physician Assistants and Nurse Practitioners) who all work together to provide you with the care you need, when you need it.  We recommend signing up for the patient portal called "MyChart".  Sign up information is provided on this After Visit Summary.  MyChart is used to connect with patients for Virtual Visits (Telemedicine).  Patients are able to view lab/test results, encounter notes, upcoming appointments, etc.  Non-urgent messages can be sent to your provider as well.   To learn more about what you can do with MyChart, go to [email or web address].    Your next appointment:   2 month(s)  The format for your next appointment:   In Person  Provider:   Eleonore Chiquito, MD     Time Spent with Patient: I have spent a total of 35 minutes with patient reviewing hospital notes, telemetry, EKGs, labs and examining the patient as well as establishing an assessment and plan that was discussed with the patient.  > 50% of time was spent in direct patient care.  Signed, Addison Naegeli. Audie Box, MD, [place]  [address], [place], [place] [address] [phone]  11/01/2020 9:34 AM

## 2020-11-01 ENCOUNTER — Ambulatory Visit: Payer: Medicare HMO | Admitting: Cardiovascular Disease

## 2020-11-01 ENCOUNTER — Encounter: Payer: Self-pay | Admitting: Cardiovascular Disease

## 2020-11-01 ENCOUNTER — Other Ambulatory Visit: Payer: Self-pay

## 2020-11-01 VITALS — BP 142/66 | HR 81 | Ht 71.0 in | Wt 181.6 lb

## 2020-11-01 DIAGNOSIS — I2510 Atherosclerotic heart disease of native coronary artery without angina pectoris: Secondary | ICD-10-CM | POA: Diagnosis not present

## 2020-11-01 DIAGNOSIS — I4819 Other persistent atrial fibrillation: Secondary | ICD-10-CM

## 2020-11-01 DIAGNOSIS — I447 Left bundle-branch block, unspecified: Secondary | ICD-10-CM | POA: Diagnosis not present

## 2020-11-01 DIAGNOSIS — E782 Mixed hyperlipidemia: Secondary | ICD-10-CM | POA: Diagnosis not present

## 2020-11-01 DIAGNOSIS — I5022 Chronic systolic (congestive) heart failure: Secondary | ICD-10-CM

## 2020-11-01 DIAGNOSIS — I340 Nonrheumatic mitral (valve) insufficiency: Secondary | ICD-10-CM

## 2020-11-01 MED ORDER — CARVEDILOL 12.5 MG PO TABS: 12.5000 mg | ORAL_TABLET | Freq: Two times a day (BID) | ORAL | 3 refills | Status: DC

## 2020-11-01 MED ORDER — CLOPIDOGREL BISULFATE 75 MG PO TABS: 75.0000 mg | ORAL_TABLET | Freq: Every day | ORAL | 3 refills | Status: DC

## 2020-11-01 MED ORDER — SIMVASTATIN 20 MG PO TABS: 20.0000 mg | ORAL_TABLET | Freq: Every evening | ORAL | 3 refills | Status: AC

## 2020-11-01 MED ORDER — HYDRALAZINE HCL 25 MG PO TABS: 25.0000 mg | ORAL_TABLET | Freq: Three times a day (TID) | ORAL | 3 refills | Status: DC

## 2020-11-01 MED ORDER — ISOSORBIDE MONONITRATE ER 30 MG PO TB24: 30.0000 mg | ORAL_TABLET | Freq: Every day | ORAL | 6 refills | Status: DC

## 2020-11-01 NOTE — Patient Instructions (Signed)
Medication Instructions:  Stop Aldactone  Start Hydralazine 25 mg three times daily  Stop Aspirin Start Plavix 75 mg daily   *If you need a refill on your cardiac medications before your next appointment, please call your pharmacy*   Testing/Procedures: Echocardiogram - Your physician has requested that you have an echocardiogram. Echocardiography is a painless test that uses sound waves to create images of your heart. It provides your doctor with information about the size and shape of your heart and how well your heart's chambers and valves are working. This procedure takes approximately one hour. There are no restrictions for this procedure. This will be performed at our [facility] location - [address].    Follow-Up: At [facility], you and your health needs are our priority.  As part of our continuing mission to provide you with exceptional heart care, we have created designated Provider Care Teams.  These Care Teams include your primary Cardiologist (physician) and Advanced Practice Providers (APPs -  Physician Assistants and Nurse Practitioners) who all work together to provide you with the care you need, when you need it.  We recommend signing up for the patient portal called "MyChart".  Sign up information is provided on this After Visit Summary.  MyChart is used to connect with patients for Virtual Visits (Telemedicine).  Patients are able to view lab/test results, encounter notes, upcoming appointments, etc.  Non-urgent messages can be sent to your provider as well.   To learn more about what you can do with MyChart, go to [email or web address].    Your next appointment:   2 month(s)  The format for your next appointment:   In Person  Provider:   Eleonore Chiquito, MD

## 2020-11-02 ENCOUNTER — Telehealth: Payer: Self-pay | Admitting: Cardiovascular Disease

## 2020-11-02 NOTE — Telephone Encounter (Signed)
Spoke with patient of Dr. Audie Box who was seen 11/01/20  Plavix was just started  Explained that plavix is antiplatelet - rx'ed for CAD Explained that eliquis is anticoagulant - - on this for afib  He verbalized understanding of explanation

## 2020-11-02 NOTE — Telephone Encounter (Signed)
   Pt c/o medication issue:  1. Name of Medication: eliquis and plavix  2. How are you currently taking this medication (dosage and times per day)?   3. Are you having a reaction (difficulty breathing--STAT)?   4. What is your medication issue? Pt would like to clarify if he needs to take both plavix and eliquis

## 2020-11-13 ENCOUNTER — Ambulatory Visit (HOSPITAL_COMMUNITY): Payer: Medicare HMO | Attending: Cardiovascular Disease

## 2020-11-13 ENCOUNTER — Other Ambulatory Visit: Payer: Self-pay

## 2020-11-13 ENCOUNTER — Encounter (HOSPITAL_COMMUNITY): Payer: Self-pay | Admitting: Radiology

## 2020-11-13 DIAGNOSIS — I5022 Chronic systolic (congestive) heart failure: Secondary | ICD-10-CM

## 2020-11-13 LAB — ECHOCARDIOGRAM COMPLETE
Area-P 1/2: 3.39 cm2
MV M vel: 5.83 m/s
MV Peak grad: 136 mmHg
Radius: 0.6 cm
S' Lateral: 5.45 cm

## 2020-11-20 ENCOUNTER — Telehealth: Payer: Self-pay | Admitting: Cardiovascular Disease

## 2020-11-20 NOTE — Telephone Encounter (Signed)
  pt says last week he started having trouble sleeping... after a few days pt took a valium 450mg .. the next night he took two.. pt lost all his strength in his feet and legs, pt would like to know should he wait for this medication to leave his system or is there something else he should do... please advise.

## 2020-11-20 NOTE — Telephone Encounter (Signed)
Called patient- he states he will not take any more sleep aids and will try melatonin.  Advised patient to call back if not any better.  Patient verbalized understanding.

## 2020-11-23 ENCOUNTER — Emergency Department (HOSPITAL_COMMUNITY): Payer: Medicare HMO

## 2020-11-23 ENCOUNTER — Inpatient Hospital Stay (HOSPITAL_COMMUNITY)
Admission: EM | Admit: 2020-11-23 | Discharge: 2020-12-04 | DRG: 682 | Disposition: A | Discharge status: Skilled Nursing Facility | Payer: Medicare HMO | Attending: Family Medicine | Admitting: Family Medicine

## 2020-11-23 ENCOUNTER — Encounter (HOSPITAL_COMMUNITY): Payer: Self-pay

## 2020-11-23 ENCOUNTER — Other Ambulatory Visit: Payer: Self-pay

## 2020-11-23 ENCOUNTER — Telehealth: Payer: Self-pay | Admitting: Cardiovascular Disease

## 2020-11-23 DIAGNOSIS — I495 Sick sinus syndrome: Secondary | ICD-10-CM | POA: Diagnosis present

## 2020-11-23 DIAGNOSIS — S2232XA Fracture of one rib, left side, initial encounter for closed fracture: Secondary | ICD-10-CM | POA: Diagnosis present

## 2020-11-23 DIAGNOSIS — K921 Melena: Secondary | ICD-10-CM | POA: Diagnosis not present

## 2020-11-23 DIAGNOSIS — J849 Interstitial pulmonary disease, unspecified: Secondary | ICD-10-CM | POA: Diagnosis present

## 2020-11-23 DIAGNOSIS — K5900 Constipation, unspecified: Secondary | ICD-10-CM | POA: Diagnosis present

## 2020-11-23 DIAGNOSIS — N184 Chronic kidney disease, stage 4 (severe): Secondary | ICD-10-CM | POA: Diagnosis present

## 2020-11-23 DIAGNOSIS — L89316 Pressure-induced deep tissue damage of right buttock: Secondary | ICD-10-CM | POA: Diagnosis present

## 2020-11-23 DIAGNOSIS — I959 Hypotension, unspecified: Secondary | ICD-10-CM | POA: Diagnosis present

## 2020-11-23 DIAGNOSIS — E7800 Pure hypercholesterolemia, unspecified: Secondary | ICD-10-CM | POA: Diagnosis present

## 2020-11-23 DIAGNOSIS — I472 Ventricular tachycardia, unspecified: Secondary | ICD-10-CM | POA: Diagnosis not present

## 2020-11-23 DIAGNOSIS — I255 Ischemic cardiomyopathy: Secondary | ICD-10-CM | POA: Diagnosis present

## 2020-11-23 DIAGNOSIS — I5023 Acute on chronic systolic (congestive) heart failure: Secondary | ICD-10-CM | POA: Diagnosis present

## 2020-11-23 DIAGNOSIS — E279 Disorder of adrenal gland, unspecified: Secondary | ICD-10-CM | POA: Diagnosis present

## 2020-11-23 DIAGNOSIS — I447 Left bundle-branch block, unspecified: Secondary | ICD-10-CM | POA: Diagnosis present

## 2020-11-23 DIAGNOSIS — I5022 Chronic systolic (congestive) heart failure: Secondary | ICD-10-CM | POA: Diagnosis not present

## 2020-11-23 DIAGNOSIS — Z20822 Contact with and (suspected) exposure to covid-19: Secondary | ICD-10-CM | POA: Diagnosis present

## 2020-11-23 DIAGNOSIS — Z7409 Other reduced mobility: Secondary | ICD-10-CM | POA: Diagnosis not present

## 2020-11-23 DIAGNOSIS — R6889 Other general symptoms and signs: Secondary | ICD-10-CM | POA: Diagnosis not present

## 2020-11-23 DIAGNOSIS — L89326 Pressure-induced deep tissue damage of left buttock: Secondary | ICD-10-CM | POA: Diagnosis present

## 2020-11-23 DIAGNOSIS — Z833 Family history of diabetes mellitus: Secondary | ICD-10-CM

## 2020-11-23 DIAGNOSIS — R296 Repeated falls: Secondary | ICD-10-CM | POA: Diagnosis present

## 2020-11-23 DIAGNOSIS — N401 Enlarged prostate with lower urinary tract symptoms: Secondary | ICD-10-CM | POA: Diagnosis not present

## 2020-11-23 DIAGNOSIS — Z7902 Long term (current) use of antithrombotics/antiplatelets: Secondary | ICD-10-CM

## 2020-11-23 DIAGNOSIS — I4819 Other persistent atrial fibrillation: Secondary | ICD-10-CM | POA: Diagnosis present

## 2020-11-23 DIAGNOSIS — Z79899 Other long term (current) drug therapy: Secondary | ICD-10-CM

## 2020-11-23 DIAGNOSIS — Z9181 History of falling: Secondary | ICD-10-CM

## 2020-11-23 DIAGNOSIS — R778 Other specified abnormalities of plasma proteins: Secondary | ICD-10-CM | POA: Diagnosis present

## 2020-11-23 DIAGNOSIS — D539 Nutritional anemia, unspecified: Secondary | ICD-10-CM | POA: Diagnosis not present

## 2020-11-23 DIAGNOSIS — I130 Hypertensive heart and chronic kidney disease with heart failure and stage 1 through stage 4 chronic kidney disease, or unspecified chronic kidney disease: Secondary | ICD-10-CM | POA: Diagnosis present

## 2020-11-23 DIAGNOSIS — R54 Age-related physical debility: Secondary | ICD-10-CM | POA: Diagnosis present

## 2020-11-23 DIAGNOSIS — Z8673 Personal history of transient ischemic attack (TIA), and cerebral infarction without residual deficits: Secondary | ICD-10-CM

## 2020-11-23 DIAGNOSIS — W19XXXA Unspecified fall, initial encounter: Secondary | ICD-10-CM | POA: Diagnosis present

## 2020-11-23 DIAGNOSIS — I2510 Atherosclerotic heart disease of native coronary artery without angina pectoris: Secondary | ICD-10-CM | POA: Diagnosis present

## 2020-11-23 DIAGNOSIS — Y92019 Unspecified place in single-family (private) house as the place of occurrence of the external cause: Secondary | ICD-10-CM | POA: Diagnosis not present

## 2020-11-23 DIAGNOSIS — K4020 Bilateral inguinal hernia, without obstruction or gangrene, not specified as recurrent: Secondary | ICD-10-CM | POA: Diagnosis present

## 2020-11-23 DIAGNOSIS — Z87891 Personal history of nicotine dependence: Secondary | ICD-10-CM

## 2020-11-23 DIAGNOSIS — I440 Atrioventricular block, first degree: Secondary | ICD-10-CM | POA: Diagnosis present

## 2020-11-23 DIAGNOSIS — W19XXXD Unspecified fall, subsequent encounter: Secondary | ICD-10-CM | POA: Diagnosis not present

## 2020-11-23 DIAGNOSIS — I252 Old myocardial infarction: Secondary | ICD-10-CM

## 2020-11-23 DIAGNOSIS — I340 Nonrheumatic mitral (valve) insufficiency: Secondary | ICD-10-CM | POA: Diagnosis present

## 2020-11-23 DIAGNOSIS — S32018A Other fracture of first lumbar vertebra, initial encounter for closed fracture: Secondary | ICD-10-CM | POA: Diagnosis present

## 2020-11-23 DIAGNOSIS — S32009A Unspecified fracture of unspecified lumbar vertebra, initial encounter for closed fracture: Secondary | ICD-10-CM | POA: Diagnosis not present

## 2020-11-23 DIAGNOSIS — Z66 Do not resuscitate: Secondary | ICD-10-CM | POA: Diagnosis present

## 2020-11-23 DIAGNOSIS — N179 Acute kidney failure, unspecified: Secondary | ICD-10-CM | POA: Diagnosis present

## 2020-11-23 DIAGNOSIS — R195 Other fecal abnormalities: Secondary | ICD-10-CM

## 2020-11-23 DIAGNOSIS — R338 Other retention of urine: Secondary | ICD-10-CM | POA: Diagnosis not present

## 2020-11-23 DIAGNOSIS — Z7901 Long term (current) use of anticoagulants: Secondary | ICD-10-CM

## 2020-11-23 DIAGNOSIS — S32009D Unspecified fracture of unspecified lumbar vertebra, subsequent encounter for fracture with routine healing: Secondary | ICD-10-CM | POA: Diagnosis not present

## 2020-11-23 DIAGNOSIS — W010XXA Fall on same level from slipping, tripping and stumbling without subsequent striking against object, initial encounter: Secondary | ICD-10-CM | POA: Diagnosis present

## 2020-11-23 DIAGNOSIS — I480 Paroxysmal atrial fibrillation: Secondary | ICD-10-CM | POA: Diagnosis not present

## 2020-11-23 DIAGNOSIS — R008 Other abnormalities of heart beat: Secondary | ICD-10-CM | POA: Diagnosis not present

## 2020-11-23 DIAGNOSIS — E1122 Type 2 diabetes mellitus with diabetic chronic kidney disease: Secondary | ICD-10-CM | POA: Diagnosis present

## 2020-11-23 DIAGNOSIS — Z7189 Other specified counseling: Secondary | ICD-10-CM | POA: Diagnosis not present

## 2020-11-23 DIAGNOSIS — D61818 Other pancytopenia: Secondary | ICD-10-CM | POA: Diagnosis present

## 2020-11-23 DIAGNOSIS — Z7984 Long term (current) use of oral hypoglycemic drugs: Secondary | ICD-10-CM

## 2020-11-23 DIAGNOSIS — N281 Cyst of kidney, acquired: Secondary | ICD-10-CM | POA: Diagnosis present

## 2020-11-23 LAB — CBC WITH DIFFERENTIAL/PLATELET
Abs Immature Granulocytes: 0.03 10*3/uL (ref 0.00–0.07)
Basophils Absolute: 0 10*3/uL (ref 0.0–0.1)
Basophils Relative: 0 %
Eosinophils Absolute: 0 10*3/uL (ref 0.0–0.5)
Eosinophils Relative: 1 %
HCT: 23.5 % — ABNORMAL LOW (ref 39.0–52.0)
Hemoglobin: 7.4 g/dL — ABNORMAL LOW (ref 13.0–17.0)
Immature Granulocytes: 1 %
Lymphocytes Relative: 16 %
Lymphs Abs: 0.7 10*3/uL (ref 0.7–4.0)
MCH: 38.5 pg — ABNORMAL HIGH (ref 26.0–34.0)
MCHC: 31.5 g/dL (ref 30.0–36.0)
MCV: 122.4 fL — ABNORMAL HIGH (ref 80.0–100.0)
Monocytes Absolute: 0.3 10*3/uL (ref 0.1–1.0)
Monocytes Relative: 6 %
Neutro Abs: 3.5 10*3/uL (ref 1.7–7.7)
Neutrophils Relative %: 76 %
Platelets: 93 10*3/uL — ABNORMAL LOW (ref 150–400)
RBC: 1.92 MIL/uL — ABNORMAL LOW (ref 4.22–5.81)
RDW: 19.2 % — ABNORMAL HIGH (ref 11.5–15.5)
WBC: 4.5 10*3/uL (ref 4.0–10.5)
nRBC: 0 % (ref 0.0–0.2)

## 2020-11-23 LAB — URINALYSIS, ROUTINE W REFLEX MICROSCOPIC
Bilirubin Urine: NEGATIVE
Glucose, UA: >=500 mg/dL — AB
Hgb urine dipstick: NEGATIVE
Ketones, ur: NEGATIVE mg/dL
Leukocytes,Ua: NEGATIVE
Nitrite: NEGATIVE
Protein, ur: NEGATIVE mg/dL
Specific Gravity, Urine: 1.012 (ref 1.005–1.030)
pH: 5 (ref 5.0–8.0)

## 2020-11-23 LAB — COMPREHENSIVE METABOLIC PANEL
ALT: 18 U/L (ref 0–44)
AST: 13 U/L — ABNORMAL LOW (ref 15–41)
Albumin: 2.9 g/dL — ABNORMAL LOW (ref 3.5–5.0)
Alkaline Phosphatase: 48 U/L (ref 38–126)
Anion gap: 6 (ref 5–15)
BUN: 99 mg/dL — ABNORMAL HIGH (ref 8–23)
CO2: 15 mmol/L — ABNORMAL LOW (ref 22–32)
Calcium: 8.1 mg/dL — ABNORMAL LOW (ref 8.9–10.3)
Chloride: 115 mmol/L — ABNORMAL HIGH (ref 98–111)
Creatinine, Ser: 2.68 mg/dL — ABNORMAL HIGH (ref 0.61–1.24)
GFR, Estimated: 22 mL/min — ABNORMAL LOW (ref >60)
Glucose, Bld: 180 mg/dL — ABNORMAL HIGH (ref 70–99)
Potassium: 4.7 mmol/L (ref 3.5–5.1)
Sodium: 136 mmol/L (ref 135–145)
Total Bilirubin: 0.7 mg/dL (ref 0.3–1.2)
Total Protein: 6.1 g/dL — ABNORMAL LOW (ref 6.5–8.1)

## 2020-11-23 LAB — ABO/RH: ABO/RH(D): O POS

## 2020-11-23 LAB — I-STAT CHEM 8, ED
BUN: 116 mg/dL — ABNORMAL HIGH (ref 8–23)
Calcium, Ion: 1.19 mmol/L (ref 1.15–1.40)
Chloride: 112 mmol/L — ABNORMAL HIGH (ref 98–111)
Creatinine, Ser: 2.9 mg/dL — ABNORMAL HIGH (ref 0.61–1.24)
Glucose, Bld: 177 mg/dL — ABNORMAL HIGH (ref 70–99)
HCT: 22 % — ABNORMAL LOW (ref 39.0–52.0)
Hemoglobin: 7.5 g/dL — ABNORMAL LOW (ref 13.0–17.0)
Potassium: 4.7 mmol/L (ref 3.5–5.1)
Sodium: 138 mmol/L (ref 135–145)
TCO2: 15 mmol/L — ABNORMAL LOW (ref 22–32)

## 2020-11-23 LAB — LACTIC ACID, PLASMA: Lactic Acid, Venous: 1.5 mmol/L (ref 0.5–1.9)

## 2020-11-23 LAB — PROTIME-INR
INR: 1.5 — ABNORMAL HIGH (ref 0.8–1.2)
Prothrombin Time: 18.4 seconds — ABNORMAL HIGH (ref 11.4–15.2)

## 2020-11-23 LAB — RESP PANEL BY RT-PCR (FLU A&B, COVID) ARPGX2
Influenza A by PCR: NEGATIVE
Influenza B by PCR: NEGATIVE
SARS Coronavirus 2 by RT PCR: NEGATIVE

## 2020-11-23 LAB — PHOSPHORUS: Phosphorus: 5.1 mg/dL — ABNORMAL HIGH (ref 2.5–4.6)

## 2020-11-23 LAB — PREPARE RBC (CROSSMATCH)

## 2020-11-23 LAB — MAGNESIUM: Magnesium: 2.3 mg/dL (ref 1.7–2.4)

## 2020-11-23 LAB — TROPONIN I (HIGH SENSITIVITY)
Troponin I (High Sensitivity): 65 ng/L — ABNORMAL HIGH (ref <18)
Troponin I (High Sensitivity): 64 ng/L — ABNORMAL HIGH (ref <18)

## 2020-11-23 LAB — CK: Total CK: 70 U/L (ref 49–397)

## 2020-11-23 MED ORDER — SODIUM CHLORIDE 0.9 % IV SOLN: INTRAVENOUS | Status: DC

## 2020-11-23 MED ORDER — SODIUM CHLORIDE 0.9% IV SOLUTION: Freq: Once | INTRAVENOUS | Status: AC

## 2020-11-23 MED ORDER — LACTATED RINGERS IV SOLN: INTRAVENOUS | Status: DC

## 2020-11-23 MED ORDER — OXYCODONE HCL 5 MG PO TABS
2.5000 mg | ORAL_TABLET | ORAL | Status: DC | PRN
  Administered 2020-11-23 – 2020-11-24 (×3): 2.5 mg via ORAL
  Filled 2020-11-23 (×3): qty 1

## 2020-11-23 MED ORDER — POLYETHYLENE GLYCOL 3350 17 G PO PACK
17.0000 g | PACK | Freq: Every day | ORAL | Status: DC
  Administered 2020-11-24 – 2020-11-26 (×3): 17 g via ORAL
  Filled 2020-11-23 (×3): qty 1

## 2020-11-23 MED ORDER — ACETAMINOPHEN 650 MG RE SUPP: 650.0000 mg | Freq: Four times a day (QID) | RECTAL | Status: DC | PRN

## 2020-11-23 MED ORDER — HEPARIN SODIUM (PORCINE) 5000 UNIT/ML IJ SOLN
5000.0000 [IU] | Freq: Three times a day (TID) | INTRAMUSCULAR | Status: DC
  Administered 2020-11-24: 5000 [IU] via SUBCUTANEOUS
  Filled 2020-11-23: qty 1

## 2020-11-23 MED ORDER — ACETAMINOPHEN 325 MG PO TABS: 650.0000 mg | ORAL_TABLET | Freq: Four times a day (QID) | ORAL | Status: DC | PRN

## 2020-11-23 MED ORDER — SIMVASTATIN 20 MG PO TABS
20.0000 mg | ORAL_TABLET | Freq: Every evening | ORAL | Status: DC
  Administered 2020-11-23 – 2020-12-03 (×11): 20 mg via ORAL
  Filled 2020-11-23 (×11): qty 1

## 2020-11-23 MED ORDER — CARVEDILOL 12.5 MG PO TABS: 12.5000 mg | ORAL_TABLET | Freq: Two times a day (BID) | ORAL | Status: DC

## 2020-11-23 MED ORDER — AMIODARONE HCL 200 MG PO TABS
100.0000 mg | ORAL_TABLET | Freq: Every day | ORAL | Status: DC
  Administered 2020-11-24 – 2020-12-04 (×10): 100 mg via ORAL
  Filled 2020-11-23 (×11): qty 1

## 2020-11-23 NOTE — ED Provider Notes (Addendum)
[facility] EMERGENCY DEPARTMENT Provider Note   CSN: [id number] Arrival date & time: 11/23/20  1425     History Chief Complaint  Patient presents with   Bradycardia    Stanley Moore is a 87 y.o. male.  HPI Patient presents from home via EMS.  He is soon joined by his daughter who assists with history.  He complains of low back pain.  He notes that he typically has some back pain.  However, it was 1 week ago the patient lost his balance, fell.  Since that time he has had more pain than usual, with decreased activity secondary to this.  There is associated anorexia, no additional falls.  He notes that he feels swollen, has no dyspnea, no chest pain.  EMS reports the patient was bradycardic, hypotensive on arrival, and required pacemaker in transport.  Patient did not seemingly receive additional therapy prior to initiation of pacemaker function. He has no melena no hematemesis, no bright red blood per rectum. He acknowledges poor renal function, but daughter is unaware of this.   Per Triage notes:  "Stanley Moore , a 87 y.o. male  was evaluated upon EMS arrival.  Pt complains of lower back pain.  Patient had a fall on Friday whereupon he lost balance.  He does not recall specifically injuring his back at that time.  He did have pain over the weekend however and on Monday felt like his legs were very heavy and they just kind of lost strength under him causing him to crumpled to the ground.  He denies any injury associated with this.  He reports both legs have just felt heavy and kind of wooden.  This reason he has been mostly sitting in his recliner for a couple of days.  Today his daughter insisted that he come to the emergency department for evaluation.  On EMS arrival patient was sitting in the recliner alert and interactive.  They examined his back and identified that his blood pressure seemed low and his heart rate was recording in the 30s but they were  having difficulty getting accurate reading.  EMS tried a fluid bolus and they were getting low readings on blood pressure and heart rate and at that time determined to pace.  Clinically however patient had not made any distinct changes."    Past Medical History:  Diagnosis Date   CHF (congestive heart failure) (HCC)    CRI (chronic renal insufficiency), stage 3 (moderate) (HCC)    Diabetes mellitus without complication (HCC)    Hyperlipidemia    Hypertension    LBBB (left bundle branch block)    Moderate mitral regurgitation    Paroxysmal atrial fibrillation [facility])     Patient Active Problem List   Diagnosis Date Noted   NSTEMI (non-ST elevated myocardial infarction) ([place]) 10/13/2020   Persistent atrial fibrillation ([place])    Unspecified essential hypertension 04/06/2012   Pure hypercholesterolemia 04/06/2012   Type II or unspecified type diabetes mellitus without mention of complication, not stated as uncontrolled 04/06/2012    Past Surgical History:  Procedure Laterality Date   appendicitis     CARDIOVERSION N/A 06/27/2019   Procedure: CARDIOVERSION;  Surgeon: Elouise Munroe, MD;  Location: [facility] ENDOSCOPY;  Service: Cardiovascular;  Laterality: N/A;   MOLE REMOVAL         Family History  Problem Relation Age of Onset   Diabetes Mother     Social History   Tobacco Use   Smoking status: Former  Years: 5.00    Types: Cigarettes   Smokeless tobacco: Never  Substance Use Topics   Alcohol use: Yes    Comment: wine with dinner   Drug use: No    Home Medications Prior to Admission medications   Medication Sig Start Date End Date Taking? Authorizing Provider  amiodarone (PACERONE) 100 MG tablet TAKE 1 TABLET BY MOUTH EVERY DAY 10/01/20  Yes O'Neal, Cassie Freer, MD  apixaban (ELIQUIS) 2.5 MG TABS tablet TAKE 1 TABLET BY MOUTH TWICE A DAY 09/12/20  Yes O'Neal, Cassie Freer, MD  carvedilol (COREG) 12.5 MG tablet Take 1 tablet (12.5 mg total) by mouth 2 (two) times  daily. 11/01/20  Yes O'Neal, Cassie Freer, MD  clopidogrel (PLAVIX) 75 MG tablet Take 1 tablet (75 mg total) by mouth daily. 11/01/20  Yes O'Neal, Cassie Freer, MD  furosemide (LASIX) 20 MG tablet TAKE 1 TABLET BY MOUTH EVERY DAY Patient taking differently: Take 20 mg by mouth daily. 09/06/20  Yes O'Neal, Cassie Freer, MD  hydrALAZINE (APRESOLINE) 25 MG tablet Take 1 tablet (25 mg total) by mouth 3 (three) times daily. 11/01/20 01/30/21 Yes O'Neal, Cassie Freer, MD  isosorbide mononitrate (IMDUR) 30 MG 24 hr tablet Take 1 tablet (30 mg total) by mouth daily. 11/01/20  Yes O'Neal, Cassie Freer, MD  JARDIANCE 10 MG TABS tablet TAKE 1 TABLET BY MOUTH EVERY DAY WITH BREAKFAST Patient taking differently: Take 10 mg by mouth daily. 04/09/20  Yes O'Neal, Cassie Freer, MD  nitroGLYCERIN (NITROSTAT) 0.4 MG SL tablet Place 1 tablet (0.4 mg total) under the tongue every 5 (five) minutes x 3 doses as needed for chest pain. 10/14/20  Yes Barrett, Evelene Croon, PA-C  simvastatin (ZOCOR) 20 MG tablet Take 1 tablet (20 mg total) by mouth every evening. 11/01/20  Yes O'Neal, Cassie Freer, MD  spironolactone (ALDACTONE) 25 MG tablet Take 25 mg by mouth daily.   Yes [provider]    Allergies    Patient has no known allergies.  Review of Systems   Review of Systems  Constitutional:        Per HPI, otherwise negative  HENT:         Per HPI, otherwise negative  Respiratory:         Per HPI, otherwise negative  Cardiovascular:        Per HPI, otherwise negative  Gastrointestinal:  Negative for abdominal pain, blood in stool and vomiting.  Endocrine:       Negative aside from HPI  Genitourinary:        Neg aside from HPI   Musculoskeletal:        Per HPI, otherwise negative  Skin:  Positive for pallor.  Neurological:  Positive for weakness. Negative for syncope.   Physical Exam Updated Vital Signs BP (!) 107/48   Pulse (!) 47   Temp 97.6 F (36.4 C) (Oral)   Resp (!) 21   Ht 5\' 11"  (1.803  m)   Wt 85.3 kg   SpO2 96%   BMI 26.22 kg/m   Physical Exam Vitals and nursing note reviewed.  Constitutional:      Appearance: He is well-developed. He is ill-appearing. He is not toxic-appearing.  HENT:     Head: Normocephalic and atraumatic.  Eyes:     Conjunctiva/sclera: Conjunctivae normal.  Cardiovascular:     Rate and Rhythm: Regular rhythm. Bradycardia present.  Pulmonary:     Effort: Pulmonary effort is normal. No respiratory distress.     Breath sounds: No  stridor.  Abdominal:     General: There is no distension.     Tenderness: There is no abdominal tenderness. There is no guarding.  Musculoskeletal:     Right lower leg: Edema present.     Left lower leg: Edema present.  Skin:    General: Skin is warm and dry.     Coloration: Skin is pale.  Neurological:     Mental Status: He is alert and oriented to person, place, and time.     Motor: Atrophy present. No tremor.     Comments: 4/5 strength bilat LE  Psychiatric:        Mood and Affect: Mood normal.        Behavior: Behavior normal.    ED Results / Procedures / Treatments   Labs (all labs ordered are listed, but only abnormal results are displayed) Labs Reviewed  COMPREHENSIVE METABOLIC PANEL - Abnormal; Notable for the following components:      Result Value   Chloride 115 (*)    CO2 15 (*)    Glucose, Bld 180 (*)    BUN 99 (*)    Creatinine, Ser 2.68 (*)    Calcium 8.1 (*)    Total Protein 6.1 (*)    Albumin 2.9 (*)    AST 13 (*)    GFR, Estimated 22 (*)    All other components within normal limits  CBC WITH DIFFERENTIAL/PLATELET - Abnormal; Notable for the following components:   RBC 1.92 (*)    Hemoglobin 7.4 (*)    HCT 23.5 (*)    MCV 122.4 (*)    MCH 38.5 (*)    RDW 19.2 (*)    Platelets 93 (*)    All other components within normal limits  PROTIME-INR - Abnormal; Notable for the following components:   Prothrombin Time 18.4 (*)    INR 1.5 (*)    All other components within normal  limits  PHOSPHORUS - Abnormal; Notable for the following components:   Phosphorus 5.1 (*)    All other components within normal limits  I-STAT CHEM 8, ED - Abnormal; Notable for the following components:   Chloride 112 (*)    BUN 116 (*)    Creatinine, Ser 2.90 (*)    Glucose, Bld 177 (*)    TCO2 15 (*)    Hemoglobin 7.5 (*)    HCT 22.0 (*)    All other components within normal limits  TROPONIN I (HIGH SENSITIVITY) - Abnormal; Notable for the following components:   Troponin I (High Sensitivity) 65 (*)    All other components within normal limits  RESP PANEL BY RT-PCR (FLU A&B, COVID) ARPGX2  LACTIC ACID, PLASMA  MAGNESIUM  CK  BRAIN NATRIURETIC PEPTIDE  LACTIC ACID, PLASMA  URINALYSIS, ROUTINE W REFLEX MICROSCOPIC  TROPONIN I (HIGH SENSITIVITY)    EKG EKG Interpretation  Date/Time:  Friday November 23 2020 14:54:59 EST Ventricular Rate:  52 PR Interval:    QRS Duration: 173 QT Interval:  506 QTC Calculation: 471 R Axis:   143 Text Interpretation: Junctional rhythm ST-t wave abnormality Artifact Abnormal ECG Nonspecific intraventricular conduction delay Confirmed by Carmin Muskrat [phone]) on 11/23/2020 4:55:20 PM  Radiology CT ABDOMEN PELVIS WO CONTRAST  Result Date: 11/23/2020 CLINICAL DATA:  Recent fall with abdominal pain, initial encounter EXAM: CT ABDOMEN AND PELVIS WITHOUT CONTRAST TECHNIQUE: Multidetector CT imaging of the abdomen and pelvis was performed following the standard protocol without IV contrast. COMPARISON:  06/15/2020 FINDINGS: Lower chest: Small pleural  effusions are noted bilaterally. Mild atelectatic changes are seen. No focal confluent infiltrate is noted. Hepatobiliary: Gallbladder is within normal limits. Some mild nodularity to the liver is noted suggestive of cirrhotic change. No focal mass is noted. Pancreas: Pancreas is predominately fatty infiltrated. No acute abnormality is noted. Spleen: Spleen is within normal limits. Adrenals/Urinary  Tract: Adrenal glands are well visualized bilaterally. Left adrenal gland is unremarkable. Right adrenal gland demonstrates a focal 18 mm lesion likely representing an adenoma but incompletely evaluated on this exam. The kidneys demonstrate bilateral renal cysts. The largest of these again lies on the left measuring up to 10 cm in greatest dimension. There simple in appearance. No renal calculi are identified. Fullness of the left ureter is noted proximally with distal tapering. No obstructive stone is noted. The bladder is well distended. Stomach/Bowel: Scattered diverticular change of the colon is seen. Herniation of a loop of sigmoid colon into a left inguinal hernia is noted without obstructive change or incarceration. More proximal colon appears unremarkable. The appendix is not well visualized. Small bowel and stomach appear within normal limits. Vascular/Lymphatic: Aortic atherosclerosis. No enlarged abdominal or pelvic lymph nodes. Reproductive: Prostate is unremarkable. Other: Bilateral inguinal hernias are noted left greater than right. Sigmoid colon is noted within the left inguinal hernia. Fat is noted within the right inguinal hernia. Musculoskeletal: Undisplaced fracture of the left twelfth rib is noted. Additionally there are mildly displaced fractures involving the left L1, left L2 and right L3 transverse processes. Additionally there is an anterior inferior corner fracture from the L1 vertebral body which will be better evaluated on the upcoming CT of the lumbar spine. IMPRESSION: Bilateral pleural effusions with mild atelectatic changes. Mild nodularity to the liver without focal mass likely representing mild cirrhosis. Right adrenal lesion likely representing an adenoma but incompletely evaluated on this exam. Given the patient's age no further follow-up is recommended. Diverticulosis without diverticulitis is noted. Bilateral inguinal hernias are noted with sigmoid colon herniating into the left  inguinal hernia. Multiple transverse process fractures as well as a left twelfth rib fracture. Additionally anterior inferior corner fracture of L1 is noted. This will be better evaluated on upcoming lumbar spine CT. Electronically Signed   By: Inez Catalina M.D.   On: 11/23/2020 19:30   CT L-SPINE NO CHARGE  Result Date: 11/23/2020 CLINICAL DATA:  Lower back pain, recent fall none EXAM: CT LUMBAR SPINE WITHOUT CONTRAST TECHNIQUE: Multidetector CT imaging of the lumbar spine was performed without intravenous contrast administration. Multiplanar CT image reconstructions were also generated. COMPARISON:  None. FINDINGS: Segmentation: 5 lumbar type vertebrae. Bilateral L5-S1 assimilation joints. Alignment: Straightening of the normal lumbar lordosis. Mild retrolisthesis L4 on L5. Trace retrolisthesis L5 on S1. Vertebrae: Fracture through the anterior inferior endplate of L1 (series 13, image 37), which appears acute. Osseous bridging is noted along the anterior right aspect of the vertebral bodies, likely DISH. Osseous bridging is also noted along several of the spinous processes. Congenitally short pedicles, which narrow the AP diameter of the spinal canal. Paraspinal and other soft tissues: Please see same-day CT abdomen pelvis for soft tissue results. Disc levels: T12-L1: No significant disc bulge. No spinal canal stenosis or neural foraminal narrowing. L1-L2: Moderate disc bulge. Moderate facet arthropathy. Moderate spinal canal stenosis. No neural foraminal narrowing. L2-L3: Mild disc bulge. Mild facet arthropathy. No definite spinal canal stenosis. No neural foraminal narrowing. L3-L4: Calcified disc bulge. Moderate facet arthropathy. Mild spinal canal stenosis. Mild bilateral neural foraminal narrowing. L4-L5: Mild retrolisthesis with  broad-based disc bulge. Moderate facet arthropathy. Ligamentum flavum hypertrophy. Severe spinal canal stenosis. Moderate bilateral neural foraminal narrowing. L5-S1: Trace  retrolisthesis with minimal disc bulge. Moderate facet arthropathy. No spinal canal stenosis. Mild bilateral neural foraminal narrowing. IMPRESSION: 1. Fracture through the anterior inferior endplate of L1, which appears acute or subacute. 2. Osseous bridging along the anterior aspect of the vertebral bodies, most likely diffuse idiopathic skeletal hyperostosis, with additional bridging along the spinous processes. 3. L4-L5 severe spinal canal stenosis and moderate bilateral neural foraminal narrowing. 4. L3-L4 mild spinal canal stenosis and mild bilateral neural foraminal narrowing. 5. For soft tissue findings, please see same-day CT abdomen pelvis. Electronically Signed   By: Merilyn Baba M.D.   On: 11/23/2020 19:36   DG Chest Port 1 View  Result Date: 11/23/2020 CLINICAL DATA:  Weakness, low back pain, recent fall EXAM: PORTABLE CHEST 1 VIEW COMPARISON:  10/13/2020 FINDINGS: Cardiomegaly without edema or CHF pattern. No pneumothorax. Interval development of a small left effusion. Chronic bibasilar interstitial changes and atelectasis. Trachea midline. Aorta atherosclerotic. Bones are osteopenic and degenerative changes of the spine. IMPRESSION: Stable cardiomegaly and basilar interstitial lung disease. Small left pleural effusion blunting the left costophrenic angle. Aortic Atherosclerosis (ICD10-I70.0). Electronically Signed   By: Jerilynn Mages.  Shick M.D.   On: 11/23/2020 15:57    Procedures Procedures   Medications Ordered in ED Medications  lactated ringers infusion ( Intravenous New Bag/Given 11/23/20 1626)  0.9 %  sodium chloride infusion (has no administration in time range)    ED Course  I have reviewed the triage vital signs and the nursing notes.  Pertinent labs & imaging results that were available during my care of the patient were reviewed by me and considered in my medical decision making (see chart for details). Cardiac monitor sinus bradycardia 50s.  Pulse ox 95% room air normal Patient  received fluid resuscitation on arrival, blood pressure improved from 90/50, now 115/50 at 8:18 PM    8:18 PM Patient in no distress.  I reviewed the patient's CT scans, notable for multiple lumbar spine transverse process fractures.  Left 12th rib fracture.  Otherwise CT abdomen pelvis generally reassuring notation made of multiple renal cysts but no obvious obstructing lesion contributing to the patient's worsening renal function.  Patient remains without current complaints aside from weakness and back pain.  Patient will have TLSO brace applied, can have next day Ortho consult as needed, will require admission to our family practice teaching service for further fluid resuscitation, monitoring, management of his acute kidney injury, weakness, anemia, fall, t lumbar transverse process fractures. During the duration to the patient's ED course he remained sinus rhythm, though with bradycardia.  He had no additional episodes suggesting need for pacemaker.  Blood pressure improved with fluid resuscitation.  Has been seen and evaluated by our family medicine team I have discussed this case at bedside with them.  Given his anemia, transfusion indicated for symptomatic anemia in addition to ongoing therapy as above for acute kidney injury, vertebral spinous process fractures.  Second troponin value is essentially the same as the first, reassuring, as above, low suspicion for new acute ischemic event, some suspicion for demand ischemia, particular given the patient's anemia. MDM Rules/Calculators/A&P MDM Number of Diagnoses or Management Options AKI (acute kidney injury) [facility]): new, needed workup Closed fracture of transverse process of lumbar vertebra, initial encounter [facility]): new, needed workup Fall: new, needed workup   Amount and/or Complexity of Data Reviewed Clinical lab tests: ordered and reviewed Tests  in the radiology section of CPT: ordered and reviewed Tests in the medicine section of CPT:  reviewed Decide to obtain previous medical records or to obtain history from someone other than the patient: yes Obtain history from someone other than the patient: yes Review and summarize past medical records: yes Discuss the patient with other providers: yes Independent visualization of images, tracings, or specimens: yes  Risk of Complications, Morbidity, and/or Mortality Presenting problems: high Diagnostic procedures: high Management options: high  Critical Care Total time providing critical care: 30-74 minutes (35)  Patient Progress Patient progress: improved   Final Clinical Impression(s) / ED Diagnoses Final diagnoses:  Fall  AKI (acute kidney injury) ([place])  Closed fracture of transverse process of lumbar vertebra, initial encounter ([place])     Carmin Muskrat, MD 11/23/20 2143

## 2020-11-23 NOTE — ED Triage Notes (Signed)
Per report, pt was brought in via EMS from home. Pt fell about 4 days ago and stayed in his chair w/ minimal activity. Per family, when pt attempted to get up out of chair today he was noted to be weak. EMS reports family called cardiology who told them to call 911. Upon arrival to scene, EMS noted pt HR to be 48. EMS initiated pacing 5 min prior to arrival to ED. Pt was being paced upon arrival to ED. Pacing was discontinued by Pfeiffer MD upon arrival to ED.

## 2020-11-23 NOTE — ED Provider Notes (Signed)
Emergency Medicine Provider Triage Evaluation Note  Stanley Moore , a 87 y.o. male  was evaluated upon EMS arrival.  Pt complains of lower back pain.  Patient had a fall on Friday whereupon he lost balance.  He does not recall specifically injuring his back at that time.  He did have pain over the weekend however and on Monday felt like his legs were very heavy and they just kind of lost strength under him causing him to crumpled to the ground.  He denies any injury associated with this.  He reports both legs have just felt heavy and kind of wooden.  This reason he has been mostly sitting in his recliner for a couple of days.  Today his daughter insisted that he come to the emergency department for evaluation.  On EMS arrival patient was sitting in the recliner alert and interactive.  They examined his back and identified that his blood pressure seemed low and his heart rate was recording in the 30s but they were having difficulty getting accurate reading.  EMS tried a fluid bolus and they were getting low readings on blood pressure and heart rate and at that time determined to pace.  Clinically however patient had not made any distinct changes  Review of Systems  Positive: Back pain, lower extremity numbness and lower extremity swelling Negative: No chest pain, palpitations or syncope  Physical Exam  BP (!) 90/50 (BP Location: Left Arm)   Pulse (!) 51   Temp 97.6 F (36.4 C) (Oral)   Resp 14   Ht 5\' 11"  (1.803 m)   Wt 85.3 kg   SpO2 96%   BMI 26.22 kg/m  Gen:   Awake, on arrival patient has pacer activated and is uncomfortable in appearance.  He is answering questions Resp:  Normal effort breath sounds diffusely soft, it sounds distant but radial pulse is consistently in the 50s and 2+. MSK:   Pain with movement of the lower back.  Rash across the lower back possibly petechial.  crosses midline less likely shingles. Other:  2+ edema bilateral lower extremities.  Remedies slightly pale  and cool but Doppler pulses present bilaterally.  Medical Decision Making  Medically screening exam initiated at 3:00 PM.  Appropriate orders placed.  CHARLENE COWDREY was informed that the remainder of the evaluation will be completed by another provider, this initial triage assessment does not replace that evaluation, and the importance of remaining in the ED until their evaluation is complete.  Pacer turned off.  Patient had perfusion with good distal pulses in the 50s without pacing.  Mental status is clear.  He is not showing signs of decompensating with respiratory distress.  Mental status is clear.  We will pursue diagnostic evaluation for back pain, lower extremity numbness and edema.   Charlesetta Shanks, MD 11/23/20 1511

## 2020-11-23 NOTE — ED Notes (Addendum)
Ortho asked to be paged once the pt has a IP room to place the brace on the pt back, admitting aware and agreed

## 2020-11-23 NOTE — ED Notes (Signed)
Consent at bedside.  

## 2020-11-23 NOTE — H&P (Addendum)
[facility] Admission History and Physical Service Pager: [phone]  Patient name: Stanley Moore Medical record number: [id number] Date of birth: Nov 02, 1931 Age: 87 y.o. Gender: male  Primary Care Provider: Heywood Bene, PA-C Consultants: None Code Status: DNR/DNI Preferred Emergency Contact:  Contact Information     Name Relation Home Work Mobile   [place] Spouse [phone]  [phone]    Chief Complaint: Weakness and pain s/p multiple falls  Assessment and Plan: BURCH MARCHUK is a 87 y.o. male presenting with weakness and low back pain s/p fall, found to have multiple transverse process fractures as well as left 12th rib fracture. PMH is significant for hypertension, hyperlipidemia, T2DM, CKD 3b, persistent A. 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  Weakness & Low Back Pain s/p Multiple Falls Multiple Transverse Process Fractures  Left 12th Rib Fx Patient presented to the ED after having multiple falls over the weekend where his legs felt like they were buckling. He denied any kind of lightheadedness, loss of consciousness, or head trauma. Today patient felt more weak than usual and had extreme pain with any movement after falling on chair on Monday. His family called his cardiologist and PCP who told him to call 911. EMS found patient to have heart rates of 48 and tried a fluid bolus but then had heart rates in the 30s on recordings with difficulty obtaining accurate readings. EMS initiated pacing 5 minutes before arriving to the ED which was subsequently stopped in ED. In the ED imaging of the chest showed stable cardiomegaly with basilar interstitial lung disease and left pleural effusion blunting the left costophrenic angle. CT abdomen pelvis showed multiple transverse process fractures L1-L3 and corner fracture of L1 vertebral body, as well as a left 12th rib fracture.  Labs  showed normal potassium and magnesium, bicarb low at 15, and AKI to 2.68 on admission.  Troponins were trended which were 65>64.  Lactic acid was normal and had macrocytic anemia and thrombocytopenia discussed in problems below. Vitals in the ED showed bradycardia in the high 40s to 50s, some soft blood pressures to 110s over 40s-50s saturating in high 90s on room air. On exam patient laying flat on bed not able to move much without pain.  When laying completely flat and not moving patient rates his pain 0/10. Recurrent falls likely 2/2 to weakness which anemia could be precipitating. Deconditioning vs inadequate ambulation devices also possibilities as patient says he only uses a cane at home. Syncope on differential given cardiac history however patient denies any lightheadedness during falls or LOC.  -Admit to FPTS attending Dr. Andria Frames, med telemetry -Continuous cardiac monitoring -Vitals per floor protocol -Acetaminophen 650 mg every 6 hours as needed for mild pain -Oxycodone 2.5 mg every 4 hours as needed for moderate to severe pain -Orthopedic technician to place back brace -Deferring ortho consult for now, likely non-surgical and will continue with supportive treatment -Fall precautions -Delirium precautions -Up with assistance -Keep O2 sats >92% -PT/OT to evaluate and treat, may need pain control before given pain with movements -AM CBC, CMP, TSH  AKI on CKD Stage 3b Creatinine was 2.69 on admission and increased to 2.90 on repeat. UA was clear for infection and showed greater than 500 glucose. Baseline Cr around 1.8 in 2022. Says he has been drinking same amount as usual over this past week.  In EMS received 1 L bolus of fluids per triage notes. In ED he received LR infusion  100 mL/hour and normal saline 125 mL/hr. On exam mucous membranes were very dry. Though his mucous membranes appear very dry, overall volume status is hard to determine as he has pitting edema to b/l lower extremities.  His soft BP may also suggest hypovolemia. Given decreased EF, will give gentle IV hydration for limited time and reassess.   -Monitor on a.m. CMP -LR IVF at 75 mL/hr for 12 hours -Monitor urine output -Monitor cardiopulmonary status -Encourage oral intake  Macrocytic anemia Hemoglobin of 7.4 on admission.  Past hemoglobin recorded in epic of 9.4 and 11.6 in 2022 and 2021.  MCV was elevated to 122.4.  Does not currently drink any alcohol but had previous history of drinking 1 shot of liquor daily unsure of how many years.  AST/ALT within normal limits on CMP at 13 and 18 respectively.  Has no altered mental status, and sensation is intact on feet bilaterally.  Says he has never gotten a colonoscopy before and his stools range from a pale gray color to brown. Denies any hematuria or hematochezia.  No abdominal pain on examination.  Last TSH in 2021 of 2.74.  No active bleeding visualized. -Hemoglobin transfusion threshold of 7 given patient's history of ischemic heart disease and CAD  -Transfuse 1U pRBC now -f/u post-transfusion H&H -B12 and folate level -BNP -Monitor on CBCs and clinically   Thrombocytopenia Platelets of 93 on admission. Platelets on last admission were in the mid 100s.  Has had medication of Plavix. CT abd notes some liver changes suggestive of cirrhosis which could explain this. Other differential includes nutritional deficiencies (Vit B12, folate), malignancies (not up to date on cancer screenings), doubt infection (though afebrile and no leukocytosis).  -Monitor CBC while on subq heparin -Continue Plavix, hold Eliquis for now  T2DM: chronic, well controlled Last A1c 10/14/2020 of 6.7.  Home medication of Jardiance 10 mg daily. -Hold home jardiance while in hospital -Carb modified/heart healthy diet -Monitor on CMP/BMP  HFrEF 25-30%  Hx of left bundle branch block  Moderate mitral regurgitation   Patient is followed by cardiology at [facility] with the  last visit 11/01/2020. EKG on admission showed bradycardia to 52.  Last echo on 11/13/2020 showed EF of 25-30% with severely decreased function of left ventricle with grade 2 diastolic dysfunction.  The mitral valve was degenerative and showed severe mitral valve regurgitation.  On exam no focal lung findings and saturating well on room air.  Had 2+ pitting edema to midshin bilaterally which is worse than his normal.  Says he took his Lasix this morning.  Per cardiology office note has refused cardiac catheterizations in the past, and does not want ICD placement, invasive angiography, and wants conservative approach and is DNR.  Had conversations with cardiologist on risk of sudden cardiac death.  Wants just medical therapy. Home medications of carvedilol, lasix 20 mg daily, and spironolactone. -Hold home lasix and spironolactone given active hydration, reassess volume status in the AM and adjust as needed -Decrease carvedilol to 6.25 mg BID given bradycardia  -Strict I's and O's -Daily weights -Plan for heart failure consult in AM   Persistent A. fib s/p  Cardioversion 2021 Home medication of amiodarone for rhythm control and Eliquis.  Rates in the 80s. -Continue home medication of amiodarone 100 mg daily -Hold home Eliquis  CAD: chronic, stable At last cardiologist visit he was switched from aspirin to Plavix which cardiology would like to continue for at least a year and given he is  on Eliquis was the reason why single antiplatelet agent was chosen. -Continue Plavix and Carvedilol   Hyperlipidemia: chronic, stable  Last lipid panel 10/14/2020 showed LDL of 53 and total cholesterol of 109.  Home medication of simvastatin. -Continue home simvastatin 20 mg daily  Recent NSTEMI 10/13/2020 Was found to have NSTEMI on last admission in Oct 2022 with unstable anginal symptoms.  No catheterization was performed given patient's wishes. Today, he denies any chest pain and denies any recent chest pain this  week. Imdur was given to patient at discharge. Troponins 65>64.  -Monitor symptoms -Cardiac monitoring  History of HTN with soft BPs Soft blood pressures in the ED, 90/50 on arrival. Home medications of hydralazine 25 mg TID, imdur 30 mg, coreg 12.5 mg BID. -Hold home medication given soft pressures, restart when appropriate  Constipation: Acute Has not had BM since saturday or Sunday. Abdomen mildly distended but no pain to palpation and normoactive BS. -Miralax daily, can increase as needed  FEN/GI: Carb modified heart healthy Prophylaxis: Subq Heparin  Disposition: med-tele  History of Present Illness:  ANTHONIO MIZZELL is a 87 y.o. male presenting with lower back pain since a fall  on Monday, 11/14.  States that he felt weak, "and it wasn't that I fell but my legs submerged". Further goes on to say that his legs buckled and these events happened twice on Saturday, twice on Sunday and once on Monday. He states that on Monday he fell on some chairs, ended up on the floor and couldn't get up for about a half hour. He had to call his daughter. States that he couldn't move until today because any small movement caused intense pain. He hasn't been moving much these last few days. His son and daughter contacted his cardiologist today who recommended evaluation in the ED. The pain was completely relieved if he was still. The pain was localized to his lower back.   "What happens is now, maybe every half hour, I get a little spasm" (in his back). He is unable to stand up. He has felt slightly short of breath since falling. He has not taken any pain medications at home.   He typically walks with a cane. Denies hitting his head during any of the falls.  Denies LOC.   Has been constipated since Saturday. Last bowel movement was either Saturday or Sunday.  States that his feet and ankles are swollen. Takes two water pills for this and has been taking them during this time. He follows with  Cardiology. He has not wanted a pacemaker placed in the past. He takes Eliquis twice a day and has been taking those without any missed doses.   Review Of Systems: Per HPI with the following additions:   Review of Systems  HENT:  Negative for congestion and sore throat.   Eyes:  Negative for visual disturbance.  Respiratory:  Positive for shortness of breath.   Cardiovascular:  Positive for leg swelling. Negative for chest pain and palpitations.  Gastrointestinal:  Positive for constipation. Negative for abdominal pain and diarrhea.  Genitourinary:  Negative for dysuria.  Musculoskeletal:  Positive for back pain.  Neurological:  Positive for weakness. Negative for syncope, light-headedness and headaches.    Patient Active Problem List   Diagnosis Date Noted   NSTEMI (non-ST elevated myocardial infarction) ([place]) 10/13/2020   Persistent atrial fibrillation ([place])    Unspecified essential hypertension 04/06/2012   Pure hypercholesterolemia 04/06/2012   Type II or unspecified type diabetes mellitus without  mention of complication, not stated as uncontrolled 04/06/2012    Past Medical History: Past Medical History:  Diagnosis Date   CHF (congestive heart failure) (HCC)    CRI (chronic renal insufficiency), stage 3 (moderate) (HCC)    Diabetes mellitus without complication (HCC)    Hyperlipidemia    Hypertension    LBBB (left bundle branch block)    Moderate mitral regurgitation    Paroxysmal atrial fibrillation ([place])     Past Surgical History: Past Surgical History:  Procedure Laterality Date   appendicitis     CARDIOVERSION N/A 06/27/2019   Procedure: CARDIOVERSION;  Surgeon: Elouise Munroe, MD;  Location: [facility] ENDOSCOPY;  Service: Cardiovascular;  Laterality: N/A;   MOLE REMOVAL     Social History: Social History  Smoked 3 packs a day for about 20 years. Quit 40 years ago. Used to drink one glass of gin and scotch nightly, no longer drinks.  Additional social history:  Lives in a townhouse with his wife. He does not feel he is able to manage at home- they are planning on moving to a senior living facility.   Please also refer to relevant sections of EMR.  Family History: Family History  Problem Relation Age of Onset   Diabetes Mother    Allergies and Medications: No Known Allergies No current facility-administered medications on file prior to encounter.   Current Outpatient Medications on File Prior to Encounter  Medication Sig Dispense Refill   amiodarone (PACERONE) 100 MG tablet TAKE 1 TABLET BY MOUTH EVERY DAY 90 tablet 2   apixaban (ELIQUIS) 2.5 MG TABS tablet TAKE 1 TABLET BY MOUTH TWICE A DAY 180 tablet 1   carvedilol (COREG) 12.5 MG tablet Take 1 tablet (12.5 mg total) by mouth 2 (two) times daily. 180 tablet 3   clopidogrel (PLAVIX) 75 MG tablet Take 1 tablet (75 mg total) by mouth daily. 90 tablet 3   furosemide (LASIX) 20 MG tablet TAKE 1 TABLET BY MOUTH EVERY DAY (Patient taking differently: Take 20 mg by mouth daily.) 90 tablet 3   hydrALAZINE (APRESOLINE) 25 MG tablet Take 1 tablet (25 mg total) by mouth 3 (three) times daily. 270 tablet 3   isosorbide mononitrate (IMDUR) 30 MG 24 hr tablet Take 1 tablet (30 mg total) by mouth daily. 30 tablet 6   JARDIANCE 10 MG TABS tablet TAKE 1 TABLET BY MOUTH EVERY DAY WITH BREAKFAST (Patient taking differently: Take 10 mg by mouth daily.) 90 tablet 2   nitroGLYCERIN (NITROSTAT) 0.4 MG SL tablet Place 1 tablet (0.4 mg total) under the tongue every 5 (five) minutes x 3 doses as needed for chest pain. 25 tablet 12   simvastatin (ZOCOR) 20 MG tablet Take 1 tablet (20 mg total) by mouth every evening. 90 tablet 3   spironolactone (ALDACTONE) 25 MG tablet Take 25 mg by mouth daily.      Objective: BP (!) 107/48   Pulse (!) 47   Temp 97.6 F (36.4 C) (Oral)   Resp (!) 21   Ht 5\' 11"  (1.803 m)   Wt 85.3 kg   SpO2 96%   BMI 26.22 kg/m  Exam: General: Non-toxic appearance, laying flat on bed,  pleasant, elderly male, AxO x4 Eyes: Left eye with some yellow discharge (chronic), no erythema/injection, no conjunctiva pallor  ENTM: Dry mucous membranes, cracked appearing tongue Neck: ROM intact Cardiovascular: bradycardic, 2+ radial pulses, cap refill <2 in UE and LE Respiratory: CTAB, mildly labored at times but saturating in high 90s on  RA Gastrointestinal: Abdomen distended, no fluid wave, no tenderness to palpation, bilateral inguinal hernias LE: 1+ pitting edema to mid shin bilaterally Derm: Some scabs on LE from "bumping into things", several SKs on face and extremities  Neuro: Alert and orientedx4 CN II: PERRL CN III, IV,VI: EOMI CV V: Normal sensation in V1, V2, V3 CVII: Symmetric smile CN VIII: Normal hearing CN IX,X: Symmetric palate raise  CN XI: 5/5 shoulder shrug-pain with movement CN XII: Symmetric tongue protrusion  Normal sensation in UE and LE bilaterally  Psych: Mood appropriate  Labs and Imaging: CBC BMET  Recent Labs  Lab 11/23/20 1619 11/23/20 1631  WBC 4.5  --   HGB 7.4* 7.5*  HCT 23.5* 22.0*  PLT 93*  --    Recent Labs  Lab 11/23/20 1619 11/23/20 1631  NA 136 138  K 4.7 4.7  CL 115* 112*  CO2 15*  --   BUN 99* 116*  CREATININE 2.68* 2.90*  GLUCOSE 180* 177*  CALCIUM 8.1*  --      EKG: sinus bradycardia  CT ABDOMEN PELVIS WO CONTRAST  Result Date: 11/23/2020 CLINICAL DATA:  Recent fall with abdominal pain, initial encounter EXAM: CT ABDOMEN AND PELVIS WITHOUT CONTRAST TECHNIQUE: Multidetector CT imaging of the abdomen and pelvis was performed following the standard protocol without IV contrast. COMPARISON:  06/15/2020 FINDINGS: Lower chest: Small pleural effusions are noted bilaterally. Mild atelectatic changes are seen. No focal confluent infiltrate is noted. Hepatobiliary: Gallbladder is within normal limits. Some mild nodularity to the liver is noted suggestive of cirrhotic change. No focal mass is noted. Pancreas: Pancreas is  predominately fatty infiltrated. No acute abnormality is noted. Spleen: Spleen is within normal limits. Adrenals/Urinary Tract: Adrenal glands are well visualized bilaterally. Left adrenal gland is unremarkable. Right adrenal gland demonstrates a focal 18 mm lesion likely representing an adenoma but incompletely evaluated on this exam. The kidneys demonstrate bilateral renal cysts. The largest of these again lies on the left measuring up to 10 cm in greatest dimension. There simple in appearance. No renal calculi are identified. Fullness of the left ureter is noted proximally with distal tapering. No obstructive stone is noted. The bladder is well distended. Stomach/Bowel: Scattered diverticular change of the colon is seen. Herniation of a loop of sigmoid colon into a left inguinal hernia is noted without obstructive change or incarceration. More proximal colon appears unremarkable. The appendix is not well visualized. Small bowel and stomach appear within normal limits. Vascular/Lymphatic: Aortic atherosclerosis. No enlarged abdominal or pelvic lymph nodes. Reproductive: Prostate is unremarkable. Other: Bilateral inguinal hernias are noted left greater than right. Sigmoid colon is noted within the left inguinal hernia. Fat is noted within the right inguinal hernia. Musculoskeletal: Undisplaced fracture of the left twelfth rib is noted. Additionally there are mildly displaced fractures involving the left L1, left L2 and right L3 transverse processes. Additionally there is an anterior inferior corner fracture from the L1 vertebral body which will be better evaluated on the upcoming CT of the lumbar spine. IMPRESSION: Bilateral pleural effusions with mild atelectatic changes. Mild nodularity to the liver without focal mass likely representing mild cirrhosis. Right adrenal lesion likely representing an adenoma but incompletely evaluated on this exam. Given the patient's age no further follow-up is recommended.  Diverticulosis without diverticulitis is noted. Bilateral inguinal hernias are noted with sigmoid colon herniating into the left inguinal hernia. Multiple transverse process fractures as well as a left twelfth rib fracture. Additionally anterior inferior corner fracture of L1 is noted.  This will be better evaluated on upcoming lumbar spine CT. Electronically Signed   By: Inez Catalina M.D.   On: 11/23/2020 19:30   CT L-SPINE NO CHARGE  Result Date: 11/23/2020 CLINICAL DATA:  Lower back pain, recent fall none EXAM: CT LUMBAR SPINE WITHOUT CONTRAST TECHNIQUE: Multidetector CT imaging of the lumbar spine was performed without intravenous contrast administration. Multiplanar CT image reconstructions were also generated. COMPARISON:  None. FINDINGS: Segmentation: 5 lumbar type vertebrae. Bilateral L5-S1 assimilation joints. Alignment: Straightening of the normal lumbar lordosis. Mild retrolisthesis L4 on L5. Trace retrolisthesis L5 on S1. Vertebrae: Fracture through the anterior inferior endplate of L1 (series 13, image 37), which appears acute. Osseous bridging is noted along the anterior right aspect of the vertebral bodies, likely DISH. Osseous bridging is also noted along several of the spinous processes. Congenitally short pedicles, which narrow the AP diameter of the spinal canal. Paraspinal and other soft tissues: Please see same-day CT abdomen pelvis for soft tissue results. Disc levels: T12-L1: No significant disc bulge. No spinal canal stenosis or neural foraminal narrowing. L1-L2: Moderate disc bulge. Moderate facet arthropathy. Moderate spinal canal stenosis. No neural foraminal narrowing. L2-L3: Mild disc bulge. Mild facet arthropathy. No definite spinal canal stenosis. No neural foraminal narrowing. L3-L4: Calcified disc bulge. Moderate facet arthropathy. Mild spinal canal stenosis. Mild bilateral neural foraminal narrowing. L4-L5: Mild retrolisthesis with broad-based disc bulge. Moderate facet  arthropathy. Ligamentum flavum hypertrophy. Severe spinal canal stenosis. Moderate bilateral neural foraminal narrowing. L5-S1: Trace retrolisthesis with minimal disc bulge. Moderate facet arthropathy. No spinal canal stenosis. Mild bilateral neural foraminal narrowing. IMPRESSION: 1. Fracture through the anterior inferior endplate of L1, which appears acute or subacute. 2. Osseous bridging along the anterior aspect of the vertebral bodies, most likely diffuse idiopathic skeletal hyperostosis, with additional bridging along the spinous processes. 3. L4-L5 severe spinal canal stenosis and moderate bilateral neural foraminal narrowing. 4. L3-L4 mild spinal canal stenosis and mild bilateral neural foraminal narrowing. 5. For soft tissue findings, please see same-day CT abdomen pelvis. Electronically Signed   By: Merilyn Baba M.D.   On: 11/23/2020 19:36   DG Chest Port 1 View  Result Date: 11/23/2020 CLINICAL DATA:  Weakness, low back pain, recent fall EXAM: PORTABLE CHEST 1 VIEW COMPARISON:  10/13/2020 FINDINGS: Cardiomegaly without edema or CHF pattern. No pneumothorax. Interval development of a small left effusion. Chronic bibasilar interstitial changes and atelectasis. Trachea midline. Aorta atherosclerotic. Bones are osteopenic and degenerative changes of the spine. IMPRESSION: Stable cardiomegaly and basilar interstitial lung disease. Small left pleural effusion blunting the left costophrenic angle. Aortic Atherosclerosis (ICD10-I70.0). Electronically Signed   By: Jerilynn Mages.  Shick M.D.   On: 11/23/2020 15:57     Gerrit Heck, MD 11/23/2020, 8:04 PM PGY-1, [place] [facility] pager: [phone], text pages welcome

## 2020-11-23 NOTE — Telephone Encounter (Signed)
Pt's daughter state he is unable to get out of his chair on his own, he has fallen a couple times before that. He was scared to move so he stayed in his chair for a few days. Both legs are swollen, as well as his ankles and feet. When moving, to even stand up pt gets very winded and has lots of weakness. PCP called to check on the pt. They suggested taking him to the emergency department. The ambulance is on the way to transport pt to the emergency department. Will forward to Dr. Audie Box.

## 2020-11-23 NOTE — ED Notes (Signed)
Ortho Tech Paged

## 2020-11-23 NOTE — Telephone Encounter (Signed)
Pt c/o swelling: STAT is pt has developed SOB within 24 hours  How much weight have you gained and in what time span? yes  If swelling, where is the swelling located? Legs and ankles   Are you currently taking a fluid pill? yes  Are you currently SOB? yes  Do you have a log of your daily weights (if so, list)?    Have you gained 3 pounds in a day or 5 pounds in a week? yes  Have you traveled recently? no   Exteremly tired and unable to stand on his own

## 2020-11-24 DIAGNOSIS — I5022 Chronic systolic (congestive) heart failure: Secondary | ICD-10-CM

## 2020-11-24 DIAGNOSIS — N179 Acute kidney failure, unspecified: Principal | ICD-10-CM

## 2020-11-24 DIAGNOSIS — D539 Nutritional anemia, unspecified: Secondary | ICD-10-CM | POA: Diagnosis present

## 2020-11-24 DIAGNOSIS — I340 Nonrheumatic mitral (valve) insufficiency: Secondary | ICD-10-CM | POA: Diagnosis not present

## 2020-11-24 DIAGNOSIS — I480 Paroxysmal atrial fibrillation: Secondary | ICD-10-CM

## 2020-11-24 DIAGNOSIS — S32009A Unspecified fracture of unspecified lumbar vertebra, initial encounter for closed fracture: Secondary | ICD-10-CM

## 2020-11-24 LAB — CBC
HCT: 26.4 % — ABNORMAL LOW (ref 39.0–52.0)
Hemoglobin: 8.6 g/dL — ABNORMAL LOW (ref 13.0–17.0)
MCH: 37.2 pg — ABNORMAL HIGH (ref 26.0–34.0)
MCHC: 32.6 g/dL (ref 30.0–36.0)
MCV: 114.3 fL — ABNORMAL HIGH (ref 80.0–100.0)
Platelets: 88 K/uL — ABNORMAL LOW (ref 150–400)
RBC: 2.31 MIL/uL — ABNORMAL LOW (ref 4.22–5.81)
RDW: 22.3 % — ABNORMAL HIGH (ref 11.5–15.5)
WBC: 4.2 K/uL (ref 4.0–10.5)
nRBC: 0 % (ref 0.0–0.2)

## 2020-11-24 LAB — COMPREHENSIVE METABOLIC PANEL WITH GFR
ALT: 17 U/L (ref 0–44)
AST: 38 U/L (ref 15–41)
Albumin: 3 g/dL — ABNORMAL LOW (ref 3.5–5.0)
Alkaline Phosphatase: 49 U/L (ref 38–126)
Anion gap: 9 (ref 5–15)
BUN: 94 mg/dL — ABNORMAL HIGH (ref 8–23)
CO2: 15 mmol/L — ABNORMAL LOW (ref 22–32)
Calcium: 8.2 mg/dL — ABNORMAL LOW (ref 8.9–10.3)
Chloride: 112 mmol/L — ABNORMAL HIGH (ref 98–111)
Creatinine, Ser: 2.66 mg/dL — ABNORMAL HIGH (ref 0.61–1.24)
GFR, Estimated: 23 mL/min — ABNORMAL LOW
Glucose, Bld: 162 mg/dL — ABNORMAL HIGH (ref 70–99)
Potassium: 4.8 mmol/L (ref 3.5–5.1)
Sodium: 136 mmol/L (ref 135–145)
Total Bilirubin: 1.6 mg/dL — ABNORMAL HIGH (ref 0.3–1.2)
Total Protein: 6 g/dL — ABNORMAL LOW (ref 6.5–8.1)

## 2020-11-24 LAB — TYPE AND SCREEN
ABO/RH(D): O POS
Antibody Screen: NEGATIVE
Unit division: 0

## 2020-11-24 LAB — BRAIN NATRIURETIC PEPTIDE: B Natriuretic Peptide: 1126 pg/mL — ABNORMAL HIGH (ref 0.0–100.0)

## 2020-11-24 LAB — BPAM RBC
Blood Product Expiration Date: 202212182359
ISSUE DATE / TIME: 202211182325
Unit Type and Rh: 5100

## 2020-11-24 LAB — TSH: TSH: 5.194 u[IU]/mL — ABNORMAL HIGH (ref 0.350–4.500)

## 2020-11-24 LAB — SAVE SMEAR(SSMR), FOR PROVIDER SLIDE REVIEW

## 2020-11-24 LAB — VITAMIN B12: Vitamin B-12: 1143 pg/mL — ABNORMAL HIGH (ref 180–914)

## 2020-11-24 LAB — FOLATE: Folate: 40 ng/mL (ref >5.9)

## 2020-11-24 MED ORDER — ACETAMINOPHEN 325 MG PO TABS
650.0000 mg | ORAL_TABLET | Freq: Four times a day (QID) | ORAL | Status: DC
  Administered 2020-11-24 – 2020-12-04 (×38): 650 mg via ORAL
  Filled 2020-11-24 (×39): qty 2

## 2020-11-24 MED ORDER — ACETAMINOPHEN 650 MG RE SUPP: 650.0000 mg | Freq: Four times a day (QID) | RECTAL | Status: DC

## 2020-11-24 MED ORDER — CARVEDILOL 3.125 MG PO TABS: 3.1250 mg | ORAL_TABLET | Freq: Two times a day (BID) | ORAL | Status: DC

## 2020-11-24 MED ORDER — CARVEDILOL 6.25 MG PO TABS
6.2500 mg | ORAL_TABLET | Freq: Two times a day (BID) | ORAL | Status: DC
  Administered 2020-11-24: 6.25 mg via ORAL
  Filled 2020-11-24: qty 1

## 2020-11-24 MED ORDER — CLOPIDOGREL BISULFATE 75 MG PO TABS
75.0000 mg | ORAL_TABLET | Freq: Every day | ORAL | Status: DC
  Administered 2020-11-24 – 2020-11-27 (×4): 75 mg via ORAL
  Filled 2020-11-24 (×5): qty 1

## 2020-11-24 MED ORDER — FUROSEMIDE 10 MG/ML IJ SOLN
40.0000 mg | Freq: Two times a day (BID) | INTRAMUSCULAR | Status: DC
  Administered 2020-11-24 – 2020-11-25 (×3): 40 mg via INTRAVENOUS
  Filled 2020-11-24 (×3): qty 4

## 2020-11-24 MED ORDER — SPIRONOLACTONE 25 MG PO TABS
25.0000 mg | ORAL_TABLET | Freq: Every day | ORAL | Status: DC
  Administered 2020-11-24 – 2020-11-25 (×2): 25 mg via ORAL
  Filled 2020-11-24 (×2): qty 1

## 2020-11-24 MED ORDER — ENOXAPARIN SODIUM 30 MG/0.3ML IJ SOSY
30.0000 mg | PREFILLED_SYRINGE | INTRAMUSCULAR | Status: DC
  Administered 2020-11-24 – 2020-11-27 (×4): 30 mg via SUBCUTANEOUS
  Filled 2020-11-24 (×4): qty 0.3

## 2020-11-24 MED ORDER — OXYCODONE HCL 5 MG PO TABS
5.0000 mg | ORAL_TABLET | ORAL | Status: DC | PRN
  Administered 2020-11-24 – 2020-12-03 (×14): 5 mg via ORAL
  Filled 2020-11-24 (×14): qty 1

## 2020-11-24 MED ORDER — OXYCODONE HCL 5 MG PO TABS
2.5000 mg | ORAL_TABLET | Freq: Once | ORAL | Status: AC
  Administered 2020-11-24: 2.5 mg via ORAL
  Filled 2020-11-24: qty 1

## 2020-11-24 NOTE — Plan of Care (Signed)
  Problem: Education: Goal: Knowledge of General Education information will improve Description: Including pain rating scale, medication(s)/side effects and non-pharmacologic comfort measures Outcome: Progressing   Problem: Clinical Measurements: Goal: Diagnostic test results will improve Outcome: Progressing   Problem: Activity: Goal: Risk for activity intolerance will decrease Outcome: Progressing   Problem: Coping: Goal: Level of anxiety will decrease Outcome: Progressing   Problem: Pain Managment: Goal: General experience of comfort will improve Outcome: Progressing   

## 2020-11-24 NOTE — ED Notes (Signed)
RN called to give report, IP nurse asked to have a few minutes, will call back in 5 minutes

## 2020-11-24 NOTE — Progress Notes (Signed)
Orthopedic Tech Progress Note Patient Details:  Stanley Moore [birth year] [id number]  TLSO brace left at bedside by Stanley Moore, ortho tech.  Ortho Devices Type of Ortho Device: Other (comment) (TLSO BRACE) Ortho Device/Splint Location: at bedside Ortho Device/Splint Interventions: Ordered, Adjustment   Post Interventions Instructions Provided: Adjustment of device  Stanley Moore 11/24/2020, 7:16 AM

## 2020-11-24 NOTE — Progress Notes (Signed)
Family Medicine Teaching Service Daily Progress Note Intern Pager: [phone]  Patient name: Stanley Moore Medical record number: [id number] Date of birth: [birth year] Age: 87 y.o. Gender: male  Primary Care Provider: Heywood Bene, PA-C Consultants: Cardiology Code Status: DNR/DNI  Pt Overview and Major Events to Date:  11/18-admitted  Assessment and Plan: Stanley Moore is a 87 y.o. male presenting with weakness and low back pain s/p fall, found to have multiple transverse process fractures as well as left 12th rib fracture. PMH is significant for hypertension, hyperlipidemia, T2DM, CKD 3b, persistent A. 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   Weakness and low back pain s/p multiple falls  multiple transverse process fractures  left 12th rib fracture Pain control will increase acetaminophen to 650 mg every 6 hours scheduled.  Will increase oxycodone to 5 mg every 4 hours as needed.  I discussed with him that if additional pain control is needed we can increase the narcotic but there is always risk and benefits associated. -Incentive spirometry -Continue acetaminophen 650 mg every 6 hours scheduled -Oxycodone 5 mg every 4 hours as needed for moderate having severe pain -Consider Ortho outpatient as fracture is likely nonsurgical and will continue supportive treatment -PT/OT-OT recommend CIR  CHF exacerbation with HFrEF 25-30% ejection fraction  history of left bundle branch block  moderate mitral regurg Most recent echo ejection fraction 25-30%.  It was initially thought that the patient was fluid down and so he received gentle fluid resuscitation but then his BNP returned elevated and so was started on diuresis.  Patient follows with cardiology at [facility] heart care with last visit on 10/27.  BNP 1126.  Currently on Lasix 40 mg twice daily IV with home dose of 20 mg oral daily.  Troponin flat at 65 > 64. -We will consult heart  failure team, appreciate recommendations -Strict I's and O's -Daily weights -Lasix 40 mg IV twice daily  AKI on CKD stage IIIb Creatinine of 2.69 > 2.90 > 2.66.  Patient received gentle fluid hydration initially but BMP returned with elevation suggesting component of CHF so fluids were stopped and patient was started on Lasix 40 mg IV twice daily -Continue Lasix 40 mg IV twice daily  Persistent A. fib status post cardioversion in 2021 Home medication of amiodarone and Eliquis.  Currently holding Eliquis due to anemia. -Hold Eliquis -AM CBC  Thrombocytopenia Platelets 93 on admission, most recently 88.  This was after receiving a unit of packed red blood cells. -AM CBC we will transition from subcu heparin to Lovenox for DVT prophylaxis -Continue Plavix, holding Eliquis -We will get a peripheral smear to be evaluated by pathologist  Macrocytic anemia Hemoglobin 7.4 on admission >8.6.  Status post 1 unit packed red blood cells.  He endorses no blood in stool or black stools or no other cause of bleeding previous history of alcohol use but denies alcohol currently.  B12 and folate checked and not decreased -AM CBC -Hold Eliquis   T2DM Most recent A1c of 6.7.  A.m. CBG 160s. -Carb modified/heart healthy diet -Holding home Jardiance -Monitor on a.m. labs  Constipation Has not had a bowel movement since Saturday or Sunday.  Bowel sounds are present with no abdominal discomfort on palpation. -MiraLAX daily  FEN/GI: Carb modified/heart healthy PPx: Subcu heparin >switch to Lovenox Dispo: Pending medical work-up  Subjective:  Patient endorses pain with movement but no pain at rest due to his fractures.  He endorses no recent bleeding  or blood in stool/black stools.  Discussed with him in detail options for pain management and that we have to be cautious with narcotics but I also do not want him in extreme discomfort due to his fractures and we need to find a sweet spot between these 2  extremes.  He is on board with this.  Objective: Temp:  [97.5 F (36.4 C)-98 F (36.7 C)] 97.6 F (36.4 C) (11/19 7416) Pulse Rate:  [44-52] 48 (11/19 0608) Resp:  [12-21] 18 (11/19 0608) BP: (90-130)/(28-95) 130/57 (11/19 0608) SpO2:  [94 %-100 %] 98 % (11/19 3845) Weight:  [85.3 kg-90.6 kg] 90.6 kg (11/19 0221) Physical Exam: General: Alert and oriented in no apparent distress Heart: S1, S2 present, bradycardic with no murmurs appreciated Lungs: Some fine crackles present faintly Abdomen: Bowel sounds present, no abdominal pain Skin: Warm and dry Extremities: 1-2+ pitting edema   Laboratory: Recent Labs  Lab 11/23/20 1619 11/23/20 1631 11/24/20 0301  WBC 4.5  --  4.2  HGB 7.4* 7.5* 8.6*  HCT 23.5* 22.0* 26.4*  PLT 93*  --  88*   Recent Labs  Lab 11/23/20 1619 11/23/20 1631 11/24/20 0301  NA 136 138 136  K 4.7 4.7 4.8  CL 115* 112* 112*  CO2 15*  --  15*  BUN 99* 116* 94*  CREATININE 2.68* 2.90* 2.66*  CALCIUM 8.1*  --  8.2*  PROT 6.1*  --  6.0*  BILITOT 0.7  --  1.6*  ALKPHOS 48  --  49  ALT 18  --  17  AST 13*  --  38  GLUCOSE 180* 177* 162*    [name], DO 11/24/2020, 8:05 AM PGY-3, [facility] Intern pager: [phone], text pages welcome

## 2020-11-24 NOTE — ED Notes (Signed)
BNP was not collected and needs recollecting, Will be collected 1 hour after transfusion or will notify IP nurse.

## 2020-11-24 NOTE — Progress Notes (Signed)
FPTS Brief Progress Note  S: Patient seen at bedside for evening rounds. He was sleeping comfortably. I did not wake patient.   O: BP (!) 123/58 (BP Location: Left Arm)   Pulse (!) 52   Temp 98.5 F (36.9 C)   Resp 16   Ht 5\' 11"  (1.803 m)   Wt 90.6 kg   SpO2 94%   BMI 27.86 kg/m   Gen: sleeping comfortably, NAD Resp: normal work of breathing on room air  A/P: 87 year old male patient admitted s/p fall with multiple fractures. Also has AKI, acute on chronic HFrEF and macrocytic anemia of unclear etiology. Overall stable at this time. - Plan per day team/daily progress note - Orders reviewed. Labs for AM ordered, which was adjusted as needed.    Alcus Dad, MD 11/24/2020, 9:47 PM PGY-2, [facility] Night Resident  Please page [phone] with questions.

## 2020-11-24 NOTE — Consult Note (Addendum)
Cardiology Consultation:   Patient ID: Stanley Moore MRN: [id number]; DOB: May 14, 1931  Admit date: 11/23/2020 Date of Consult: 11/24/2020  PCP:  Stanley Bene, PA-C   [facility] Providers Cardiologist:  Stanley Field, MD  Electrophysiologist:  Stanley Grayer, MD  {   Patient Profile:   Stanley Moore is a 87 y.o. male with a hx of CAD, LBBB, paroxysmal atrial fibrillation, diabetes mellitus, chronic systolic heart failure, severe mitral regurgitation, hypertension, hyperlipidemia and chronic kidney disease stage III/IV who is being seen 11/24/2020 for the evaluation of CHF at the request of Dr. Andria Moore.  Patient was admitted October 2022 for non-STEMI.  Declined cardiac cath and manage conservatively with medication.  Last seen by Dr. Audie Moore November 01, 2020.  Denied recurrent chest pain.  He was feeling better.  Stop with aspirin.  Continue Eliquis and Plavix.  He continues to have dyspnea.  Echocardiogram done on November 13, 2020 showed stable low EF at 25 to 30%, global hypokinesis with akinesis of the basal inferior lateral wall, grade 2 diastolic dysfunction.  Elevated left atrial pressure.  Mitral valve worsened to severe.  Recommended to increase Lasix to 20 mg twice daily for 3 days and then daily.  History of Present Illness:   Stanley Moore admitted yesterday for weakness and multiple falls.  Patient reports multiple falls due to leg weakness.  He does use a cane at home.  He feels leg weakness and falls.  Denies prodromal symptoms or syncope.  No chest pain, shortness of breath, palpitation or dizziness.  Complains of back pain.  EMS found patient hypotensive and systolic blood pressure in 80s and bradycardic in 40s.  Work-up revealed multiple transverse process fracture and left 12th rib fracture.  Patient found anemic at hemoglobin of 7.4 (last reading was 9.4) thrombocytopenia with platelets of 93.   Scr 2.68>>2.9.  Patient was given 1 L fluid bolus  and then drip.  Renal Function today 2.66. BNP 1126 this morning.  Stopped IV fluids and started on IV Lasix 40 mg twice daily.  Cardiology is asked for further management of CHF.  Given multiple fall his Eliquis is held during this admission.  TSH up at 5.194 Hgb 8.6 Scr 2.66  Patient denies any shortness of breath, orthopnea or PND.  Chest pain, palpitation, melena or dizziness.  He reports he was feeling better when was Lasix 20 mg twice daily for few days.  Past Medical History:  Diagnosis Date   CHF (congestive heart failure) (HCC)    CRI (chronic renal insufficiency), stage 3 (moderate) (HCC)    Diabetes mellitus without complication (HCC)    Hyperlipidemia    Hypertension    LBBB (left bundle branch block)    Moderate mitral regurgitation    Paroxysmal atrial fibrillation (HCC)     Past Surgical History:  Procedure Laterality Date   appendicitis     CARDIOVERSION N/A 06/27/2019   Procedure: CARDIOVERSION;  Surgeon: Stanley Munroe, MD;  Location: [facility];  Service: Cardiovascular;  Laterality: N/A;   MOLE REMOVAL     Inpatient Medications: Scheduled Meds:  acetaminophen  650 mg Oral Q6H   Or   acetaminophen  650 mg Rectal Q6H   amiodarone  100 mg Oral Daily   carvedilol  6.25 mg Oral BID   clopidogrel  75 mg Oral Daily   enoxaparin (LOVENOX) injection  30 mg Subcutaneous Q24H   furosemide  40 mg Intravenous BID   polyethylene glycol  17 g Oral Daily  simvastatin  20 mg Oral QPM   spironolactone  25 mg Oral Daily   Continuous Infusions:  PRN Meds: oxyCODONE  Allergies:   No Known Allergies  Social History:   Social History   Socioeconomic History   Marital status: Married    Spouse name: Not on file   Number of children: 3   Years of education: Not on file   Highest education level: Not on file  Occupational History   Not on file  Tobacco Use   Smoking status: Former    Years: 5.00    Types: Cigarettes   Smokeless tobacco: Never  Substance  and Sexual Activity   Alcohol use: Yes    Comment: wine with dinner   Drug use: No   Sexual activity: Not on file  Other Topics Concern   Not on file  Social History Narrative   Not on file   Social Determinants of Health   Financial Resource Strain: Not on file  Food Insecurity: Not on file  Transportation Needs: Not on file  Physical Activity: Not on file  Stress: Not on file  Social Connections: Not on file  Intimate Partner Violence: Not on file    Family History:   Family History  Problem Relation Age of Onset   Diabetes Mother      ROS:  Please see the history of present illness.  All other ROS reviewed and negative.     Physical Exam/Data:   Vitals:   11/24/20 0221 11/24/20 0500 11/24/20 0608 11/24/20 1003  BP: (!) 121/53 (!) 130/57 (!) 130/57 (!) 118/49  Pulse: (!) 48 (!) 48 (!) 48 (!) 51  Resp: 20 18 18 19   Temp: (!) 97.5 F (36.4 C) 98 F (36.7 C) 97.6 F (36.4 C) 98.2 F (36.8 C)  TempSrc: Oral Oral    SpO2: 97% 98% 98% 95%  Weight: 90.6 kg     Height: 5\' 11"  (1.803 m)       Intake/Output Summary (Last 24 hours) at 11/24/2020 1230 Last data filed at 11/24/2020 1000 Gross per 24 hour  Intake 1307.18 ml  Output 1400 ml  Net -92.82 ml   Last 3 Weights 11/24/2020 11/23/2020 11/01/2020  Weight (lbs) 199 lb 11.8 oz 188 lb 181 lb 9.6 oz  Weight (kg) 90.6 kg 85.276 kg 82.373 kg     Body mass index is 27.86 kg/m.  General:  Well nourished, well developed, in no acute distress HEENT: normal Neck: no JVD Vascular: No carotid bruits; Distal pulses 2+ bilaterally Cardiac:  normal S1, S2; RRR; 3/6 murmur  Lungs:  clear to auscultation bilaterally, no wheezing, rhonchi or rales  Abd: soft, nontender, no hepatomegaly  Ext: 1+  edema R > L Musculoskeletal:  No deformities, BUE and BLE strength normal and equal Skin: warm and dry  Neuro:  CNs 2-12 intact, no focal abnormalities noted Psych:  Normal affect   EKG:  The EKG was personally reviewed and  demonstrates:  Sinus/Junctional rhythm Telemetry:  Telemetry was personally reviewed and demonstrates:  Sinus/junctional rhythm at HR of 50s  Relevant CV Studies:  Echo 11/13/2020 1. Compared to 12/15/19 MR is now severe.   2. Global hypokinesis with akinesis of the basal inferolateral wall;  overall severe LV dysfunction.   3. Left ventricular ejection fraction, by estimation, is 25 to 30%. The  left ventricle has severely decreased function. The left ventricle  demonstrates regional wall motion abnormalities (see scoring  diagram/findings for description). The left  ventricular internal cavity  size was moderately dilated. Left ventricular  diastolic parameters are consistent with Grade II diastolic dysfunction  (pseudonormalization). Elevated left atrial pressure.   4. Right ventricular systolic function is mildly reduced. The right  ventricular size is mildly enlarged. There is severely elevated pulmonary  artery systolic pressure.   5. Left atrial size was severely dilated.   6. Right atrial size was severely dilated.   7. The mitral valve is degenerative. Severe mitral valve regurgitation.  No evidence of mitral stenosis.   8. The aortic valve is tricuspid. Aortic valve regurgitation is trivial.  Mild aortic valve sclerosis is present, with no evidence of aortic valve  stenosis.   9. The inferior vena cava is dilated in size with <50% respiratory  variability, suggesting right atrial pressure of 15 mmHg.   Laboratory Data:  High Sensitivity Troponin:   Recent Labs  Lab 11/23/20 1619 11/23/20 1944  TROPONINIHS 65* 64*     Chemistry Recent Labs  Lab 11/23/20 1619 11/23/20 1631 11/24/20 0301  NA 136 138 136  K 4.7 4.7 4.8  CL 115* 112* 112*  CO2 15*  --  15*  GLUCOSE 180* 177* 162*  BUN 99* 116* 94*  CREATININE 2.68* 2.90* 2.66*  CALCIUM 8.1*  --  8.2*  MG 2.3  --   --   GFRNONAA 22*  --  23*  ANIONGAP 6  --  9    Recent Labs  Lab 11/23/20 1619 11/24/20 0301   PROT 6.1* 6.0*  ALBUMIN 2.9* 3.0*  AST 13* 38  ALT 18 17  ALKPHOS 48 49  BILITOT 0.7 1.6*   Hematology Recent Labs  Lab 11/23/20 1619 11/23/20 1631 11/24/20 0301  WBC 4.5  --  4.2  RBC 1.92*  --  2.31*  HGB 7.4* 7.5* 8.6*  HCT 23.5* 22.0* 26.4*  MCV 122.4*  --  114.3*  MCH 38.5*  --  37.2*  MCHC 31.5  --  32.6  RDW 19.2*  --  22.3*  PLT 93*  --  88*   Thyroid  Recent Labs  Lab 11/24/20 0832  TSH 5.194*    BNP Recent Labs  Lab 11/24/20 0301  BNP 1,126.0*     Radiology/Studies:  CT ABDOMEN PELVIS WO CONTRAST  Result Date: 11/23/2020 CLINICAL DATA:  Recent fall with abdominal pain, initial encounter EXAM: CT ABDOMEN AND PELVIS WITHOUT CONTRAST TECHNIQUE: Multidetector CT imaging of the abdomen and pelvis was performed following the standard protocol without IV contrast. COMPARISON:  06/15/2020 FINDINGS: Lower chest: Small pleural effusions are noted bilaterally. Mild atelectatic changes are seen. No focal confluent infiltrate is noted. Hepatobiliary: Gallbladder is within normal limits. Some mild nodularity to the liver is noted suggestive of cirrhotic change. No focal mass is noted. Pancreas: Pancreas is predominately fatty infiltrated. No acute abnormality is noted. Spleen: Spleen is within normal limits. Adrenals/Urinary Tract: Adrenal glands are well visualized bilaterally. Left adrenal gland is unremarkable. Right adrenal gland demonstrates a focal 18 mm lesion likely representing an adenoma but incompletely evaluated on this exam. The kidneys demonstrate bilateral renal cysts. The largest of these again lies on the left measuring up to 10 cm in greatest dimension. There simple in appearance. No renal calculi are identified. Fullness of the left ureter is noted proximally with distal tapering. No obstructive stone is noted. The bladder is well distended. Stomach/Bowel: Scattered diverticular change of the colon is seen. Herniation of a loop of sigmoid colon into a left  inguinal hernia is noted without obstructive change or  incarceration. More proximal colon appears unremarkable. The appendix is not well visualized. Small bowel and stomach appear within normal limits. Vascular/Lymphatic: Aortic atherosclerosis. No enlarged abdominal or pelvic lymph nodes. Reproductive: Prostate is unremarkable. Other: Bilateral inguinal hernias are noted left greater than right. Sigmoid colon is noted within the left inguinal hernia. Fat is noted within the right inguinal hernia. Musculoskeletal: Undisplaced fracture of the left twelfth rib is noted. Additionally there are mildly displaced fractures involving the left L1, left L2 and right L3 transverse processes. Additionally there is an anterior inferior corner fracture from the L1 vertebral body which will be better evaluated on the upcoming CT of the lumbar spine. IMPRESSION: Bilateral pleural effusions with mild atelectatic changes. Mild nodularity to the liver without focal mass likely representing mild cirrhosis. Right adrenal lesion likely representing an adenoma but incompletely evaluated on this exam. Given the patient's age no further follow-up is recommended. Diverticulosis without diverticulitis is noted. Bilateral inguinal hernias are noted with sigmoid colon herniating into the left inguinal hernia. Multiple transverse process fractures as well as a left twelfth rib fracture. Additionally anterior inferior corner fracture of L1 is noted. This will be better evaluated on upcoming lumbar spine CT. Electronically Signed   By: Inez Catalina M.D.   On: 11/23/2020 19:30   CT L-SPINE NO CHARGE  Result Date: 11/23/2020 CLINICAL DATA:  Lower back pain, recent fall none EXAM: CT LUMBAR SPINE WITHOUT CONTRAST TECHNIQUE: Multidetector CT imaging of the lumbar spine was performed without intravenous contrast administration. Multiplanar CT image reconstructions were also generated. COMPARISON:  None. FINDINGS: Segmentation: 5 lumbar type  vertebrae. Bilateral L5-S1 assimilation joints. Alignment: Straightening of the normal lumbar lordosis. Mild retrolisthesis L4 on L5. Trace retrolisthesis L5 on S1. Vertebrae: Fracture through the anterior inferior endplate of L1 (series 13, image 37), which appears acute. Osseous bridging is noted along the anterior right aspect of the vertebral bodies, likely DISH. Osseous bridging is also noted along several of the spinous processes. Congenitally short pedicles, which narrow the AP diameter of the spinal canal. Paraspinal and other soft tissues: Please see same-day CT abdomen pelvis for soft tissue results. Disc levels: T12-L1: No significant disc bulge. No spinal canal stenosis or neural foraminal narrowing. L1-L2: Moderate disc bulge. Moderate facet arthropathy. Moderate spinal canal stenosis. No neural foraminal narrowing. L2-L3: Mild disc bulge. Mild facet arthropathy. No definite spinal canal stenosis. No neural foraminal narrowing. L3-L4: Calcified disc bulge. Moderate facet arthropathy. Mild spinal canal stenosis. Mild bilateral neural foraminal narrowing. L4-L5: Mild retrolisthesis with broad-based disc bulge. Moderate facet arthropathy. Ligamentum flavum hypertrophy. Severe spinal canal stenosis. Moderate bilateral neural foraminal narrowing. L5-S1: Trace retrolisthesis with minimal disc bulge. Moderate facet arthropathy. No spinal canal stenosis. Mild bilateral neural foraminal narrowing. IMPRESSION: 1. Fracture through the anterior inferior endplate of L1, which appears acute or subacute. 2. Osseous bridging along the anterior aspect of the vertebral bodies, most likely diffuse idiopathic skeletal hyperostosis, with additional bridging along the spinous processes. 3. L4-L5 severe spinal canal stenosis and moderate bilateral neural foraminal narrowing. 4. L3-L4 mild spinal canal stenosis and mild bilateral neural foraminal narrowing. 5. For soft tissue findings, please see same-day CT abdomen pelvis.  Electronically Signed   By: Merilyn Baba M.D.   On: 11/23/2020 19:36   DG Chest Port 1 View  Result Date: 11/23/2020 CLINICAL DATA:  Weakness, low back pain, recent fall EXAM: PORTABLE CHEST 1 VIEW COMPARISON:  10/13/2020 FINDINGS: Cardiomegaly without edema or CHF pattern. No pneumothorax. Interval development of a small  left effusion. Chronic bibasilar interstitial changes and atelectasis. Trachea midline. Aorta atherosclerotic. Bones are osteopenic and degenerative changes of the spine. IMPRESSION: Stable cardiomegaly and basilar interstitial lung disease. Small left pleural effusion blunting the left costophrenic angle. Aortic Atherosclerosis (ICD10-I70.0). Electronically Signed   By: Jerilynn Mages.  Shick M.D.   On: 11/23/2020 15:57    Assessment and Plan:   Weakness/multiple fall -Patient with multiple history of fall due to leg weakness.  No prodromal symptoms or syncope. - He was noted hypotensive and bradycardic in 40s by EMS.  Heart rate currently in 50s. - Also has anemia and SCKD - His symptoms is from multifactorial -Management per primary team -Working with PT OT.  Family at bedside who reported pending palliative evaluation.  2.  Paroxysmal atrial fibrillation -EKG and telemetry with sinus/junctional rhythm at heart rate of 50s. -No evidence of significant bradycardia -Certainly low heart rate could cause weakness.  He is currently on carvedilol 6.25 mg twice daily and amiodarone 100 mg daily. -Eliquis held this admission secondary to multiple fall. -Needs to review risk of bleeding versus stroke. CHA2DS2-VASc Score = 6  - will dicussed rate controlled medication with MD>> Stop carvedilol for now   keep on tele  3.  Chronic systolic heart failure with severe mitral regurgitation -Echocardiogram done on November 13, 2020 showed stable low EF at 25 to 30%, global hypokinesis with akinesis of the basal inferior lateral wall, grade 2 diastolic dysfunction.  Elevated left atrial pressure.   Mitral valve worsened to severe. -Patient reports his breathing was better when higher dose of Lasix at 20 mg twice daily -Patient was given hydration yesterday with bolus fluid and then drip secondary to hypotension and elevated renal function.  This is stopped this morning and now on IV Lasix 40 mg twice daily. -Extremity edema on exam.  He is breathing relatively stable.  -Agree with diuresis. Continue spironolactone.   4. Acute on CKD IIIb/IV - Scr Scr 2.68>>2.9>> 2.66  5. Chronic anemia - Hgb 7.4>>7.5>>>> transfused 1 unit of PRBCs >> 8.3  6. CAD - Hx as above. No chest pain. Continue Plavix and Zocor. Stop carvedilol    Risk Assessment/Risk Scores:   New York Heart Association (NYHA) Functional Class NYHA Class III  CHA2DS2-VASc Score = 6  This indicates a 9.7% annual risk of stroke. The patient's score is based upon: CHF History: 1 HTN History: 1 Diabetes History: 1 Stroke History: 0 Vascular Disease History: 1 (Presumed CAD) Age Score: 2 Gender Score: 0    For questions or updates, please contact [facility] Please consult [email or web address] for contact info under    Signed, Leanor Kail, PA  11/24/2020 12:30 PM   Patient seen and examined   I agree with findings as noted by B Bhagat above  Pt is an 87 yo with known CAD, systolic CHF, severe MR, LBBB, PAF   Admitted in Oct 2022 with NSTEMI   Plan for medical therapy   Pt declined cath.     Pt admitted on 11/18 for weakness, falls   Multple.  EMS found patient hypotensive and bradycardic.    XRays showed mult Fx.  Hgb 7.4  Plt 93  Cr 2.9   Pt given IV fluids then lasix   Eliquis held   ALso transfused 1 U PRBC today  Currently patient appears uncomfortable in back brace   Complains of low back pain BP 110s to 120s/   HR 40s    Neck  Unable to assess JVP Cardiac  RRR  Gr II/VI systolic murmur Abd   Limited exam   Nontender     Ext Tr to 1+ edema   Feet warm  EKG with probable SB with First degree AV block   LBBB     Telemetry  Bradycardia   40s   IMPRESSION:  Pt is very ill with multiple problems   He has chronic systolic CHF with severe MR   Volume appears mildly increased    I would diuresis with lasix   Watch Cr  Strict I/O  WIth bradycardia I would stop carvedilol  Keep on amio for PAF   COnitnue tele  EKG in AM    Would keep off of Eliquis right now until Hgb stabilized   No obvious blood loss but Hgb was 7.4 this am   Question if with falls and anemia he should be oun Eliquis.     Keep on Plavix  for now and Zocor for   Follow CBC frequently   Dorris Carnes MD

## 2020-11-24 NOTE — ED Notes (Signed)
Pt declined any signs of transfusion reactions

## 2020-11-24 NOTE — Evaluation (Signed)
Physical Therapy Evaluation Patient Details Name: Stanley Moore MRN: [id number] DOB: 31-Jul-1931 Today's Date: 11/24/2020  History of Present Illness  87 y.o. male presents to [facility] hospital on 11/23/2020 after multiple falls. PT found to have L 12th rib fx as well as L1-3 TP fxs and L1 corner fx of vertebral body. Hgb 7.4 on admission. PMH includes HTN, HLD, T2DM, CKD 3b, persistent A. 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  Clinical Impression  Pt presents to PT with deficits in activity tolerance, strength, power, gait, balance, endurance, limited by back pain. Pt requires assistance to perform all functional mobility tasks at this time, with difficulty transferring due to lack of LE power. Pt is limited by back pain and declines attempts at ambulation after reaching recliner. Pt will benefit from aggressive mobilization to improve mobility quality and reduce falls risk. PT recommends SNF placement at this time, with the patient ultimately benefiting from [facility] long term.       Recommendations for follow up therapy are one component of a multi-disciplinary discharge planning process, led by the attending physician.  Recommendations may be updated based on patient status, additional functional criteria and insurance authorization.  Follow Up Recommendations Skilled nursing-short term rehab (<3 hours/day) (eventual transition to ALF)    Assistance Recommended at Discharge Intermittent Supervision/Assistance  Functional Status Assessment    Equipment Recommendations  Wheelchair (measurements PT) (would need a wheelchair to return home)    Recommendations for Other Services       Precautions / Restrictions Precautions Precautions: Fall;Back Precaution Booklet Issued: Yes (comment) Required Braces or Orthoses: Spinal Brace Spinal Brace: Thoracolumbosacral orthotic Restrictions Weight Bearing Restrictions: No      Mobility  Bed  Mobility Overal bed mobility: Needs Assistance Bed Mobility: Rolling;Sidelying to Sit Rolling: Min assist Sidelying to sit: Mod assist       General bed mobility comments: use of rails    Transfers Overall transfer level: Needs assistance Equipment used: Rolling walker (2 wheels) Transfers: Sit to/from Stand;Bed to chair/wheelchair/BSC Sit to Stand: Min assist;From elevated surface   Step pivot transfers: Min assist            Ambulation/Gait Ambulation/Gait assistance:  (pt declines attempts at ambulation due to pain/fatigue)                Stairs            Wheelchair Mobility    Modified Rankin (Stroke Patients Only)       Balance Overall balance assessment: Needs assistance;History of Falls Sitting-balance support: Feet supported;Single extremity supported Sitting balance-Leahy Scale: Poor Sitting balance - Comments: reliant on unilateral UE support   Standing balance support: Reliant on assistive device for balance;Bilateral upper extremity supported Standing balance-Leahy Scale: Poor                               Pertinent Vitals/Pain Pain Assessment: Faces Faces Pain Scale: Hurts even more Breathing: normal Negative Vocalization: occasional moan/groan, low speech, negative/disapproving quality Facial Expression: sad, frightened, frown Body Language: tense, distressed pacing, fidgeting Pain Location: low back Pain Descriptors / Indicators: Aching Pain Intervention(s): Monitored during session    Home Living Family/patient expects to be discharged to:: Private residence Living Arrangements: Spouse/significant other Available Help at Discharge: Family;Available 24 hours/day Type of Home: House Home Access: Stairs to enter Entrance Stairs-Rails: None Entrance Stairs-Number of Steps: 1 Alternate Level Stairs-Number of Steps: flight Home  Layout: Two level Home Equipment: Grab bars - tub/shower;Cane - quad;Other  (comment);Rolling Walker (2 wheels)      Prior Function Prior Level of Function : History of Falls (last six months);Independent/Modified Independent             Mobility Comments: pt reports ambulating with small based quad cane, reports 5 falls in the past week ADLs Comments: reports typically Modified Independent with ADLs (standing for showers), shares IADLs with wife (cleaning, cooking). Since fall in past week, pt reports unable to mobilize to bathroom, had been in chair and urinating on self     Hand Dominance   Dominant Hand: Right    Extremity/Trunk Assessment   Upper Extremity Assessment Upper Extremity Assessment: Generalized weakness    Lower Extremity Assessment Lower Extremity Assessment: Generalized weakness    Cervical / Trunk Assessment Cervical / Trunk Assessment: Other exceptions Cervical / Trunk Exceptions: lumbar fx, L rib fx  Communication   Communication: No difficulties  Cognition Arousal/Alertness: Awake/alert Behavior During Therapy: WFL for tasks assessed/performed Overall Cognitive Status: Impaired/Different from baseline Area of Impairment: Memory                     Memory: Decreased recall of precautions                  General Comments General comments (skin integrity, edema, etc.): VSS on RA    Exercises     Assessment/Plan    PT Assessment    PT Problem List         PT Treatment Interventions      PT Goals (Current goals can be found in the Care Plan section)  Acute Rehab PT Goals Patient Stated Goal: to return to independent mobility and stop falling PT Goal Formulation: With patient/family Time For Goal Achievement: 12/08/20 Potential to Achieve Goals: Fair    Frequency Min 3X/week   Barriers to discharge        Co-evaluation               AM-PAC PT "6 Clicks" Mobility  Outcome Measure Help needed turning from your back to your side while in a flat bed without using bedrails?: A  Little Help needed moving from lying on your back to sitting on the side of a flat bed without using bedrails?: A Little Help needed moving to and from a bed to a chair (including a wheelchair)?: A Little Help needed standing up from a chair using your arms (e.g., wheelchair or bedside chair)?: A Little Help needed to walk in hospital room?: Total Help needed climbing 3-5 steps with a railing? : Total 6 Click Score: 14    End of Session   Activity Tolerance: Patient limited by pain Patient left: in chair;with call bell/phone within reach;with chair alarm set Nurse Communication: Mobility status PT Visit Diagnosis: Other abnormalities of gait and mobility (R26.89);Muscle weakness (generalized) (M62.81);Pain Pain - part of body:  (back)    Time: 1111-1140 PT Time Calculation (min) (ACUTE ONLY): 29 min   Charges:   PT Evaluation $PT Eval Low Complexity: 1 Low PT Treatments $Therapeutic Activity: 8-22 mins        Zenaida Niece, PT, DPT Acute Rehabilitation Pager: [phone] Office [phone]   Zenaida Niece 11/24/2020, 12:48 PM

## 2020-11-24 NOTE — Evaluation (Addendum)
Occupational Therapy Evaluation Patient Details Name: Stanley Moore MRN: [id number] DOB: 1931/09/15 Today's Date: 11/24/2020   History of Present Illness 87 y.o. male presents to [facility] hospital on 11/23/2020 after multiple falls. PT found to have L 12th rib fx as well as L1-3 TP fxs and L1 corner fx of vertebral body. Hgb 7.4 on admission. PMH includes HTN, HLD, T2DM, CKD 3b, persistent A. 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   Clinical Impression   PTA, pt lives with spouse in two level townhouse and reports typically Modified Independent with ADLs, IADLs and mobility using quad cane. Pt endorses 4-5 recent falls impacting ability to complete daily tasks, s/p injuries noted above. Pt presents now with deficits in pain, sitting/standing balance, and strength. Educated on spinal precautions fro ADLs/general mobility, adjusted TLSO brace for appropriate fit with improved comfort reported. Despite pain, pt motivated to attempt OOB tasks. Pt overall Max A for bed mobility and Min to Mod A for side steps at bedside using RW with noted LE buckling. Pt requires Mod A for UB ADLs and Max A for LB ADLs due to deficits. Anticipate with pain control, pt will make steady progress towards goals. Based on high PLOF, motivation and rehab potential, recommend inpatient rehab prior to return home to decrease fall risk and maximize independence.      Recommendations for follow up therapy are one component of a multi-disciplinary discharge planning process, led by the attending physician.  Recommendations may be updated based on patient status, additional functional criteria and insurance authorization.   Follow Up Recommendations  Acute inpatient rehab (3hours/day)    Assistance Recommended at Discharge Frequent or constant Supervision/Assistance  Functional Status Assessment  Patient has had a recent decline in their functional status and demonstrates the ability  to make significant improvements in function in a reasonable and predictable amount of time.  Equipment Recommendations  BSC/3in1;Other (comment) (Rolling walker)    Recommendations for Other Services Rehab consult     Precautions / Restrictions Precautions Precautions: Fall;Back Precaution Booklet Issued: Yes (comment) Required Braces or Orthoses: Spinal Brace Spinal Brace: Thoracolumbosacral orthotic Restrictions Weight Bearing Restrictions: No      Mobility Bed Mobility Overal bed mobility: Needs Assistance Bed Mobility: Rolling;Sidelying to Sit;Sit to Sidelying Rolling: Min assist Sidelying to sit: Max assist     Sit to sidelying: Mod assist General bed mobility comments: Able to roll to side with Min A to min guard, Max A needed to complete log roll to EOB with heavy assist needed to get BLE off of bed and lift trunk - pt able to assist in using bedrails to lower trunk back into bed and light assist to guide LEs back    Transfers Overall transfer level: Needs assistance Equipment used: Rolling walker (2 wheels) Transfers: Sit to/from Stand Sit to Stand: Min assist;From elevated surface           General transfer comment: Min A for sit to stand from elevated bed (tall stature) with cues for hand placement and posture. Pt surprised by decreased pain with standing, able to demo side steps at EOB with Min to Mod A with DME/balance assist needed. Noted knee buckling (L > R)      Balance Overall balance assessment: Needs assistance;History of Falls Sitting-balance support: Feet supported;Bilateral upper extremity supported;Single extremity supported Sitting balance-Leahy Scale: Poor Sitting balance - Comments: reliant on primarily one UE support EOB to maintain balance, offload with pain - cues for body mechanics/back  precautions   Standing balance support: Bilateral upper extremity supported;During functional activity Standing balance-Leahy Scale: Poor Standing  balance comment: reliant on BUE support + external support                           ADL either performed or assessed with clinical judgement   ADL Overall ADL's : Needs assistance/impaired Eating/Feeding: Independent;Sitting   Grooming: Min guard;Sitting   Upper Body Bathing: Moderate assistance;Sitting   Lower Body Bathing: Maximal assistance;Sit to/from stand   Upper Body Dressing : Moderate assistance;Sitting Upper Body Dressing Details (indicate cue type and reason): Difficulty with posture/balance sitting EOB. Assistance needed to don TLSO and adjust to fit pt (was too small) Lower Body Dressing: Maximal assistance;Sit to/from stand Lower Body Dressing Details (indicate cue type and reason): Reports typically crossing LEs fo this task, unabe to do so today due to weakness and pain Toilet Transfer: Moderate assistance;Stand-pivot;Rolling walker (2 wheels)   Toileting- Clothing Manipulation and Hygiene: Maximal assistance;Sit to/from stand         General ADL Comments: Educated on back precautions for bed mobility, ADLs and transfers with handout provided. Pt limited by pain but agreeable to attempt all tasks, reports improved comfort with TLSO brace on. Pt with noted B LE buckling with mobility attempts, noted balance deficits with reliance on DME + external support     Vision Baseline Vision/History: 1 Wears glasses Ability to See in Adequate Light: 0 Adequate Patient Visual Report: No change from baseline Vision Assessment?: No apparent visual deficits     Perception     Praxis      Pertinent Vitals/Pain Pain Assessment: Faces Faces Pain Scale: Hurts whole lot Pain Location: low back with bed mobiilty and positioning Pain Descriptors / Indicators: Grimacing;Guarding;Moaning Pain Intervention(s): Monitored during session;Limited activity within patient's tolerance;Repositioned;Patient requesting pain meds-RN notified     Hand Dominance Right    Extremity/Trunk Assessment Upper Extremity Assessment Upper Extremity Assessment: Generalized weakness   Lower Extremity Assessment Lower Extremity Assessment: Defer to PT evaluation   Cervical / Trunk Assessment Cervical / Trunk Assessment: Other exceptions Cervical / Trunk Exceptions: lumbar fx, L rib fx   Communication Communication Communication: No difficulties   Cognition Arousal/Alertness: Awake/alert Behavior During Therapy: WFL for tasks assessed/performed Overall Cognitive Status: Within Functional Limits for tasks assessed                                 General Comments: pleasant, sharp witted     General Comments       Exercises     Shoulder Instructions      Home Living Family/patient expects to be discharged to:: Private residence Living Arrangements: Spouse/significant other Available Help at Discharge: Family Type of Home: House Home Access: Stairs to enter Technical brewer of Steps: 1   Home Layout: Two level Alternate Level Stairs-Number of Steps: flight Alternate Level Stairs-Rails: Right;Left Bathroom Shower/Tub: Occupational psychologist: Standard     Home Equipment: Grab bars - tub/shower;Cane - quad;Other (comment) (has walker but unsure of what kind as he does not use)   Additional Comments: reports plan to move to senior living within next month. reports wife unable to provide heavy physical assist for pt; multiple family members nearby that could assist (mostly retired)      Prior Functioning/Environment Prior Level of Function : Independent/Modified Independent  Mobility Comments: reports use of quad cane for mobility, recent hx of 4-5 falls ADLs Comments: reports typically Modified Independent with ADLs (standing for showers), shares IADLs with wife (cleaning, cooking). Since fall in past week, pt reports unable to mobilize to bathroom, had been in chair and urinating on self        OT  Problem List: Decreased strength;Decreased activity tolerance;Impaired balance (sitting and/or standing);Decreased knowledge of precautions;Decreased knowledge of use of DME or AE;Pain      OT Treatment/Interventions: Self-care/ADL training;Therapeutic exercise;Energy conservation;DME and/or AE instruction;Therapeutic activities;Patient/family education;Balance training    OT Goals(Current goals can be found in the care plan section) Acute Rehab OT Goals Patient Stated Goal: decrease pain, agreeable to rehab, return to independence OT Goal Formulation: With patient Time For Goal Achievement: 12/08/20 Potential to Achieve Goals: Good ADL Goals Pt Will Perform Grooming: with set-up;standing Pt Will Perform Lower Body Bathing: with supervision;with adaptive equipment;sit to/from stand Pt Will Perform Lower Body Dressing: with supervision;sit to/from stand;with adaptive equipment Pt Will Transfer to Toilet: with supervision;ambulating Additional ADL Goal #1: Pt to verbalize 3/3 back precautions Independently Additional ADL Goal #2: Pt to verbalize at least 3 fall prevention strategies to implement in home environment  OT Frequency: Min 2X/week   Barriers to D/C:            Co-evaluation              AM-PAC OT "6 Clicks" Daily Activity     Outcome Measure Help from another person eating meals?: None Help from another person taking care of personal grooming?: A Little Help from another person toileting, which includes using toliet, bedpan, or urinal?: A Lot Help from another person bathing (including washing, rinsing, drying)?: A Lot Help from another person to put on and taking off regular upper body clothing?: A Lot Help from another person to put on and taking off regular lower body clothing?: A Lot 6 Click Score: 15   End of Session Equipment Utilized During Treatment: Gait belt;Rolling walker (2 wheels);Back brace Nurse Communication: Mobility status;Patient requests pain  meds;Precautions;Other (comment) (TLSO brace)  Activity Tolerance: Patient tolerated treatment well;Patient limited by pain Patient left: in bed;with bed alarm set;with call bell/phone within reach  OT Visit Diagnosis: Unsteadiness on feet (R26.81);Other abnormalities of gait and mobility (R26.89);Muscle weakness (generalized) (M62.81);History of falling (Z91.81);Pain Pain - part of body:  (back)                Time: 0981-1914 OT Time Calculation (min): 31 min Charges:  OT General Charges $OT Visit: 1 Visit OT Evaluation $OT Eval Moderate Complexity: 1 Mod OT Treatments $Therapeutic Activity: 8-22 mins  Malachy Chamber, OTR/L Acute Rehab Services Office: [phone]   Layla Maw 11/24/2020, 9:07 AM

## 2020-11-24 NOTE — Progress Notes (Signed)
Report for patient received from [facility].  Patient arrived on unit at 0215. Vital signs stable. Cardiac monitoring applied per order. Lactated ringers infusing at 75 ml/hr. Patient skin assessment completed. Bed in low position and call button within reach.

## 2020-11-24 NOTE — Progress Notes (Addendum)
Inpatient Rehab Admissions:  Inpatient Rehab Consult received.  I met with patient at the bedside for rehabilitation assessment and to discuss goals and expectations of an inpatient rehab admission.  Pt acknowledged understanding. Pt requested I contact daughter, Hilda Blades and discuss CIR with her. Called Debra, no one answered. Left message; awaiting return call. Will continue to follow.  ADDENDUM: Hilda Blades returned call. She acknowledged understanding of CIR goals and expectations. She would like to discuss CIR with mother (pt's wife) and other family members before making a decision. Will continue to follow.   Signed: Gayland Curry, [place], [place] Admissions Coordinator [phone]

## 2020-11-25 DIAGNOSIS — I5022 Chronic systolic (congestive) heart failure: Secondary | ICD-10-CM | POA: Diagnosis not present

## 2020-11-25 DIAGNOSIS — N179 Acute kidney failure, unspecified: Secondary | ICD-10-CM | POA: Diagnosis not present

## 2020-11-25 DIAGNOSIS — S32009A Unspecified fracture of unspecified lumbar vertebra, initial encounter for closed fracture: Secondary | ICD-10-CM | POA: Diagnosis not present

## 2020-11-25 DIAGNOSIS — W19XXXA Unspecified fall, initial encounter: Secondary | ICD-10-CM | POA: Diagnosis present

## 2020-11-25 DIAGNOSIS — I340 Nonrheumatic mitral (valve) insufficiency: Secondary | ICD-10-CM | POA: Diagnosis not present

## 2020-11-25 DIAGNOSIS — W19XXXD Unspecified fall, subsequent encounter: Secondary | ICD-10-CM | POA: Diagnosis not present

## 2020-11-25 DIAGNOSIS — I480 Paroxysmal atrial fibrillation: Secondary | ICD-10-CM | POA: Diagnosis not present

## 2020-11-25 LAB — CBC
HCT: 27.2 % — ABNORMAL LOW (ref 39.0–52.0)
Hemoglobin: 8.8 g/dL — ABNORMAL LOW (ref 13.0–17.0)
MCH: 37.1 pg — ABNORMAL HIGH (ref 26.0–34.0)
MCHC: 32.4 g/dL (ref 30.0–36.0)
MCV: 114.8 fL — ABNORMAL HIGH (ref 80.0–100.0)
Platelets: 84 10*3/uL — ABNORMAL LOW (ref 150–400)
RBC: 2.37 MIL/uL — ABNORMAL LOW (ref 4.22–5.81)
RDW: 22.3 % — ABNORMAL HIGH (ref 11.5–15.5)
WBC: 3.3 10*3/uL — ABNORMAL LOW (ref 4.0–10.5)
nRBC: 0.6 % — ABNORMAL HIGH (ref 0.0–0.2)

## 2020-11-25 LAB — BASIC METABOLIC PANEL
Anion gap: 11 (ref 5–15)
BUN: 91 mg/dL — ABNORMAL HIGH (ref 8–23)
CO2: 15 mmol/L — ABNORMAL LOW (ref 22–32)
Calcium: 8.1 mg/dL — ABNORMAL LOW (ref 8.9–10.3)
Chloride: 111 mmol/L (ref 98–111)
Creatinine, Ser: 2.52 mg/dL — ABNORMAL HIGH (ref 0.61–1.24)
GFR, Estimated: 24 mL/min — ABNORMAL LOW (ref >60)
Glucose, Bld: 129 mg/dL — ABNORMAL HIGH (ref 70–99)
Potassium: 4.2 mmol/L (ref 3.5–5.1)
Sodium: 137 mmol/L (ref 135–145)

## 2020-11-25 LAB — PROTIME-INR
INR: 1.4 — ABNORMAL HIGH (ref 0.8–1.2)
Prothrombin Time: 17.4 seconds — ABNORMAL HIGH (ref 11.4–15.2)

## 2020-11-25 LAB — RETIC PANEL
Immature Retic Fract: 18.8 % — ABNORMAL HIGH (ref 2.3–15.9)
RBC.: 2.34 MIL/uL — ABNORMAL LOW (ref 4.22–5.81)
Retic Count, Absolute: 58.3 10*3/uL (ref 19.0–186.0)
Retic Ct Pct: 2.5 % (ref 0.4–3.1)
Reticulocyte Hemoglobin: 34.4 pg (ref >27.9)

## 2020-11-25 LAB — LACTATE DEHYDROGENASE: LDH: 173 U/L (ref 98–192)

## 2020-11-25 MED ORDER — LIDOCAINE 5 % EX PTCH
1.0000 | MEDICATED_PATCH | Freq: Once | CUTANEOUS | Status: AC
  Administered 2020-11-25: 1 via TRANSDERMAL
  Filled 2020-11-25: qty 1

## 2020-11-25 NOTE — Consult Note (Signed)
[place] [phone]   Fax:([phone]) [place] NOTE  Patient Care Team: Merwyn Katos as PCP - General (Physician Assistant) O'Neal, Cassie Freer, MD as PCP - Cardiology (Cardiology) Thompson Grayer, MD as PCP - Electrophysiology (Cardiology)  CHIEF COMPLAINTS/PURPOSE OF CONSULTATION:  "Macrocytic Anemia/Thrombocytopenia/Leukopenia "  HISTORY OF PRESENTING ILLNESS:  Stanley Moore 87 y.o. male with medical history significant for congestive heart failure, type 2 diabetes, hyperlipidemia, hypertension, and atrial fibrillation who presents for evaluation of multiple falls and was found to be pancytopenic with a macrocytic anemia.  On review of the previous records Stanley Moore had 1 CBC on record prior to his current admission.  This was performed on 06/21/2019 and showed a white blood cell count of 9.0, hemoglobin 8.6, and platelet count of 166.  On 10/13/2020 the patient was noted to have a white blood cell count 6.1, hemoglobin 9.4, and platelet count of 136.  This continues to trend downward and on admission he was found to have a white blood cell count 4.5, hemoglobin 7.4, 122.4, and a platelet count of 93.  Due to concern for this pancytopenia hematology was consulted for further evaluation and management.  On exam today Stanley Moore is resting comfortably in bed.  He reports he presents due to progressive weakness and multiple falls which unfortunately caused multiple fractures.  He reports that he has no known history of liver disease.  Additionally he is not been having any issues with fevers, chills, sweats, nausea, vomiting or diarrhea.  He reports that he eats a normal diet but tries to avoid carbs due to his sugars.  He eats red meat approximately 1-2 times per week.  He does not have any dietary restrictions otherwise.  He denies any recent weight loss or bleeding, bruising, or dark stools.  He does endorse drinking 1 glass  of wine nightly with dinner but previously used to drink 1 glass of gin nightly.  He reports that he no longer does this after his cardiologist requested he stop about 1 year ago.  He does endorse having fatigue and occasional episodes of shortness of breath but otherwise has been at his baseline level of health.  A full 10 point ROS is listed below.  MEDICAL HISTORY:  Past Medical History:  Diagnosis Date   CHF (congestive heart failure) (HCC)    CRI (chronic renal insufficiency), stage 3 (moderate) (HCC)    Diabetes mellitus without complication (HCC)    Hyperlipidemia    Hypertension    LBBB (left bundle branch block)    Moderate mitral regurgitation    Paroxysmal atrial fibrillation (HCC)     SURGICAL HISTORY: Past Surgical History:  Procedure Laterality Date   appendicitis     CARDIOVERSION N/A 06/27/2019   Procedure: CARDIOVERSION;  Surgeon: Elouise Munroe, MD;  Location: [facility] ENDOSCOPY;  Service: Cardiovascular;  Laterality: N/A;   MOLE REMOVAL      SOCIAL HISTORY: Social History   Socioeconomic History   Marital status: Married    Spouse name: Not on file   Number of children: 3   Years of education: Not on file   Highest education level: Not on file  Occupational History   Not on file  Tobacco Use   Smoking status: Former    Years: 5.00    Types: Cigarettes   Smokeless tobacco: Never  Substance and Sexual Activity   Alcohol use: Yes    Comment: wine with dinner   Drug use: No  Sexual activity: Not on file  Other Topics Concern   Not on file  Social History Narrative   Not on file   Social Determinants of Health   Financial Resource Strain: Not on file  Food Insecurity: Not on file  Transportation Needs: Not on file  Physical Activity: Not on file  Stress: Not on file  Social Connections: Not on file  Intimate Partner Violence: Not on file    FAMILY HISTORY: Family History  Problem Relation Age of Onset   Diabetes Mother     ALLERGIES:  has  No Known Allergies.  MEDICATIONS:  Current Facility-Administered Medications  Medication Dose Route Frequency Provider Last Rate Last Admin   acetaminophen (TYLENOL) tablet 650 mg  650 mg Oral Q6H Welborn, Ryan, DO   650 mg at 11/25/20 1725   Or   acetaminophen (TYLENOL) suppository 650 mg  650 mg Rectal Q6H Welborn, Ryan, DO       amiodarone (PACERONE) tablet 100 mg  100 mg Oral Daily Gerrit Heck, MD   100 mg at 11/25/20 7353   clopidogrel (PLAVIX) tablet 75 mg  75 mg Oral Daily Sharion Settler, DO   75 mg at 11/25/20 0934   enoxaparin (LOVENOX) injection 30 mg  30 mg Subcutaneous Q24H Welborn, Ryan, DO   30 mg at 11/25/20 1217   lidocaine (LIDODERM) 5 % 1 patch  1 patch Transdermal Once Holley Bouche, MD   1 patch at 11/25/20 1445   oxyCODONE (Oxy IR/ROXICODONE) immediate release tablet 5 mg  5 mg Oral Q4H PRN Wells Guiles, DO   5 mg at 11/25/20 1615   polyethylene glycol (MIRALAX / GLYCOLAX) packet 17 g  17 g Oral Daily Gerrit Heck, MD   17 g at 11/25/20 0933   simvastatin (ZOCOR) tablet 20 mg  20 mg Oral QPM Gerrit Heck, MD   20 mg at 11/25/20 1722    REVIEW OF SYSTEMS:   Constitutional: ( - ) fevers, ( - )  chills , ( - ) night sweats Eyes: ( - ) blurriness of vision, ( - ) double vision, ( - ) watery eyes Ears, nose, mouth, throat, and face: ( - ) mucositis, ( - ) sore throat Respiratory: ( - ) cough, ( - ) dyspnea, ( - ) wheezes Cardiovascular: ( - ) palpitation, ( - ) chest discomfort, ( - ) lower extremity swelling Gastrointestinal:  ( - ) nausea, ( - ) heartburn, ( - ) change in bowel habits Skin: ( - ) abnormal skin rashes Lymphatics: ( - ) new lymphadenopathy, ( - ) easy bruising Neurological: ( - ) numbness, ( - ) tingling, ( - ) new weaknesses Behavioral/Psych: ( - ) mood change, ( - ) new changes  All other systems were reviewed with the patient and are negative.  PHYSICAL EXAMINATION:  Vitals:   11/25/20 0931 11/25/20 1630  BP: (!) 107/35 (!)  115/43  Pulse: (!) 43 (!) 55  Resp: 18 16  Temp: 97.7 F (36.5 C) 98.2 F (36.8 C)  SpO2: 98% 99%   Filed Weights   11/23/20 1449 11/24/20 0221  Weight: 188 lb (85.3 kg) 199 lb 11.8 oz (90.6 kg)    GENERAL: chronically ill appearing elderly Caucasian male in NAD  SKIN: skin color, texture, turgor are normal, no rashes or significant lesions EYES: conjunctiva are pink and non-injected, sclera clear LUNGS: clear to auscultation and percussion with normal breathing effort HEART: regular rate & rhythm and no murmurs and no lower extremity edema  Musculoskeletal: no cyanosis of digits and no clubbing  PSYCH: alert & oriented x 3, fluent speech NEURO: no focal motor/sensory deficits  LABORATORY DATA:  I have reviewed the data as listed CBC Latest Ref Rng & Units 11/25/2020 11/24/2020 11/23/2020  WBC 4.0 - 10.5 K/uL 3.3(L) 4.2 -  Hemoglobin 13.0 - 17.0 g/dL 8.8(L) 8.6(L) 7.5(L)  Hematocrit 39.0 - 52.0 % 27.2(L) 26.4(L) 22.0(L)  Platelets 150 - 400 K/uL 84(L) 88(L) -    CMP Latest Ref Rng & Units 11/25/2020 11/24/2020 11/23/2020  Glucose 70 - 99 mg/dL 129(H) 162(H) 177(H)  BUN 8 - 23 mg/dL 91(H) 94(H) 116(H)  Creatinine 0.61 - 1.24 mg/dL 2.52(H) 2.66(H) 2.90(H)  Sodium 135 - 145 mmol/L 137 136 138  Potassium 3.5 - 5.1 mmol/L 4.2 4.8 4.7  Chloride 98 - 111 mmol/L 111 112(H) 112(H)  CO2 22 - 32 mmol/L 15(L) 15(L) -  Calcium 8.9 - 10.3 mg/dL 8.1(L) 8.2(L) -  Total Protein 6.5 - 8.1 g/dL - 6.0(L) -  Total Bilirubin 0.3 - 1.2 mg/dL - 1.6(H) -  Alkaline Phos 38 - 126 U/L - 49 -  AST 15 - 41 U/L - 38 -  ALT 0 - 44 U/L - 17 -    BLOOD FILM: To be reviewed.   RADIOGRAPHIC STUDIES: CT ABDOMEN PELVIS WO CONTRAST  Result Date: 11/23/2020 CLINICAL DATA:  Recent fall with abdominal pain, initial encounter EXAM: CT ABDOMEN AND PELVIS WITHOUT CONTRAST TECHNIQUE: Multidetector CT imaging of the abdomen and pelvis was performed following the standard protocol without IV contrast.  COMPARISON:  06/15/2020 FINDINGS: Lower chest: Small pleural effusions are noted bilaterally. Mild atelectatic changes are seen. No focal confluent infiltrate is noted. Hepatobiliary: Gallbladder is within normal limits. Some mild nodularity to the liver is noted suggestive of cirrhotic change. No focal mass is noted. Pancreas: Pancreas is predominately fatty infiltrated. No acute abnormality is noted. Spleen: Spleen is within normal limits. Adrenals/Urinary Tract: Adrenal glands are well visualized bilaterally. Left adrenal gland is unremarkable. Right adrenal gland demonstrates a focal 18 mm lesion likely representing an adenoma but incompletely evaluated on this exam. The kidneys demonstrate bilateral renal cysts. The largest of these again lies on the left measuring up to 10 cm in greatest dimension. There simple in appearance. No renal calculi are identified. Fullness of the left ureter is noted proximally with distal tapering. No obstructive stone is noted. The bladder is well distended. Stomach/Bowel: Scattered diverticular change of the colon is seen. Herniation of a loop of sigmoid colon into a left inguinal hernia is noted without obstructive change or incarceration. More proximal colon appears unremarkable. The appendix is not well visualized. Small bowel and stomach appear within normal limits. Vascular/Lymphatic: Aortic atherosclerosis. No enlarged abdominal or pelvic lymph nodes. Reproductive: Prostate is unremarkable. Other: Bilateral inguinal hernias are noted left greater than right. Sigmoid colon is noted within the left inguinal hernia. Fat is noted within the right inguinal hernia. Musculoskeletal: Undisplaced fracture of the left twelfth rib is noted. Additionally there are mildly displaced fractures involving the left L1, left L2 and right L3 transverse processes. Additionally there is an anterior inferior corner fracture from the L1 vertebral body which will be better evaluated on the upcoming  CT of the lumbar spine. IMPRESSION: Bilateral pleural effusions with mild atelectatic changes. Mild nodularity to the liver without focal mass likely representing mild cirrhosis. Right adrenal lesion likely representing an adenoma but incompletely evaluated on this exam. Given the patient's age no further follow-up is recommended. Diverticulosis  without diverticulitis is noted. Bilateral inguinal hernias are noted with sigmoid colon herniating into the left inguinal hernia. Multiple transverse process fractures as well as a left twelfth rib fracture. Additionally anterior inferior corner fracture of L1 is noted. This will be better evaluated on upcoming lumbar spine CT. Electronically Signed   By: Inez Catalina M.D.   On: 11/23/2020 19:30   CT L-SPINE NO CHARGE  Result Date: 11/23/2020 CLINICAL DATA:  Lower back pain, recent fall none EXAM: CT LUMBAR SPINE WITHOUT CONTRAST TECHNIQUE: Multidetector CT imaging of the lumbar spine was performed without intravenous contrast administration. Multiplanar CT image reconstructions were also generated. COMPARISON:  None. FINDINGS: Segmentation: 5 lumbar type vertebrae. Bilateral L5-S1 assimilation joints. Alignment: Straightening of the normal lumbar lordosis. Mild retrolisthesis L4 on L5. Trace retrolisthesis L5 on S1. Vertebrae: Fracture through the anterior inferior endplate of L1 (series 13, image 37), which appears acute. Osseous bridging is noted along the anterior right aspect of the vertebral bodies, likely DISH. Osseous bridging is also noted along several of the spinous processes. Congenitally short pedicles, which narrow the AP diameter of the spinal canal. Paraspinal and other soft tissues: Please see same-day CT abdomen pelvis for soft tissue results. Disc levels: T12-L1: No significant disc bulge. No spinal canal stenosis or neural foraminal narrowing. L1-L2: Moderate disc bulge. Moderate facet arthropathy. Moderate spinal canal stenosis. No neural foraminal  narrowing. L2-L3: Mild disc bulge. Mild facet arthropathy. No definite spinal canal stenosis. No neural foraminal narrowing. L3-L4: Calcified disc bulge. Moderate facet arthropathy. Mild spinal canal stenosis. Mild bilateral neural foraminal narrowing. L4-L5: Mild retrolisthesis with broad-based disc bulge. Moderate facet arthropathy. Ligamentum flavum hypertrophy. Severe spinal canal stenosis. Moderate bilateral neural foraminal narrowing. L5-S1: Trace retrolisthesis with minimal disc bulge. Moderate facet arthropathy. No spinal canal stenosis. Mild bilateral neural foraminal narrowing. IMPRESSION: 1. Fracture through the anterior inferior endplate of L1, which appears acute or subacute. 2. Osseous bridging along the anterior aspect of the vertebral bodies, most likely diffuse idiopathic skeletal hyperostosis, with additional bridging along the spinous processes. 3. L4-L5 severe spinal canal stenosis and moderate bilateral neural foraminal narrowing. 4. L3-L4 mild spinal canal stenosis and mild bilateral neural foraminal narrowing. 5. For soft tissue findings, please see same-day CT abdomen pelvis. Electronically Signed   By: Merilyn Baba M.D.   On: 11/23/2020 19:36   DG Chest Port 1 View  Result Date: 11/23/2020 CLINICAL DATA:  Weakness, low back pain, recent fall EXAM: PORTABLE CHEST 1 VIEW COMPARISON:  10/13/2020 FINDINGS: Cardiomegaly without edema or CHF pattern. No pneumothorax. Interval development of a small left effusion. Chronic bibasilar interstitial changes and atelectasis. Trachea midline. Aorta atherosclerotic. Bones are osteopenic and degenerative changes of the spine. IMPRESSION: Stable cardiomegaly and basilar interstitial lung disease. Small left pleural effusion blunting the left costophrenic angle. Aortic Atherosclerosis (ICD10-I70.0). Electronically Signed   By: Jerilynn Mages.  Shick M.D.   On: 11/23/2020 15:57   ECHOCARDIOGRAM COMPLETE  Result Date: 11/13/2020    ECHOCARDIOGRAM REPORT   Patient  Name:   Stanley Moore Date of Exam: 11/13/2020 Medical Rec #:  [id number]            Height:       71.0 in Accession #:    [id number]           Weight:       181.6 lb Date of Birth:  1931-10-16           BSA:          2.024  m Patient Age:    33 years             BP:           142/66 mmHg Patient Gender: M                    HR:           55 bpm. Exam Location:  [place] Procedure: 2D Echo, 3D Echo, Cardiac Doppler and Color Doppler Indications:    I50.22 CHF  History:        Patient has prior history of Echocardiogram examinations, most                 recent 12/15/2019. CHF, CAD and Previous Myocardial Infarction,                 Arrythmias:Atrial Fibrillation and LBBB,                 Signs/Symptoms:Shortness of Breath, Chest Pain and Fatigue; Risk                 Factors:Hypertension, Diabetes, Dyslipidemia and Former Smoker.  Sonographer:    Deliah Boston RDCS Referring Phys: Cassie Freer O'NEAL IMPRESSIONS  1. Compared to 12/15/19 MR is now severe.  2. Global hypokinesis with akinesis of the basal inferolateral wall; overall severe LV dysfunction.  3. Left ventricular ejection fraction, by estimation, is 25 to 30%. The left ventricle has severely decreased function. The left ventricle demonstrates regional wall motion abnormalities (see scoring diagram/findings for description). The left ventricular internal cavity size was moderately dilated. Left ventricular diastolic parameters are consistent with Grade II diastolic dysfunction (pseudonormalization). Elevated left atrial pressure.  4. Right ventricular systolic function is mildly reduced. The right ventricular size is mildly enlarged. There is severely elevated pulmonary artery systolic pressure.  5. Left atrial size was severely dilated.  6. Right atrial size was severely dilated.  7. The mitral valve is degenerative. Severe mitral valve regurgitation. No evidence of mitral stenosis.  8. The aortic valve is tricuspid. Aortic valve regurgitation  is trivial. Mild aortic valve sclerosis is present, with no evidence of aortic valve stenosis.  9. The inferior vena cava is dilated in size with <50% respiratory variability, suggesting right atrial pressure of 15 mmHg. Comparison(s): Prior EF 25-30%. FINDINGS  Left Ventricle: Left ventricular ejection fraction, by estimation, is 25 to 30%. The left ventricle has severely decreased function. The left ventricle demonstrates regional wall motion abnormalities. The left ventricular internal cavity size was moderately dilated. There is no left ventricular hypertrophy. Left ventricular diastolic parameters are consistent with Grade II diastolic dysfunction (pseudonormalization). Elevated left atrial pressure. Right Ventricle: The right ventricular size is mildly enlarged. Right ventricular systolic function is mildly reduced. There is severely elevated pulmonary artery systolic pressure. The tricuspid regurgitant velocity is 3.49 m/s, and with an assumed right atrial pressure of 15 mmHg, the estimated right ventricular systolic pressure is 52.7 mmHg. Left Atrium: Left atrial size was severely dilated. Right Atrium: Right atrial size was severely dilated. Pericardium: There is no evidence of pericardial effusion. Mitral Valve: The mitral valve is degenerative in appearance. Mild mitral annular calcification. Severe mitral valve regurgitation. No evidence of mitral valve stenosis. Tricuspid Valve: The tricuspid valve is normal in structure. Tricuspid valve regurgitation is mild . No evidence of tricuspid stenosis. Aortic Valve: The aortic valve is tricuspid. Aortic valve regurgitation is trivial. Mild aortic valve sclerosis is present, with no evidence of aortic valve stenosis.  Pulmonic Valve: The pulmonic valve was normal in structure. Pulmonic valve regurgitation is trivial. No evidence of pulmonic stenosis. Aorta: The aortic root is normal in size and structure. Venous: The inferior vena cava is dilated in size with  less than 50% respiratory variability, suggesting right atrial pressure of 15 mmHg. IAS/Shunts: No atrial level shunt detected by color flow Doppler. Additional Comments: Global hypokinesis with akinesis of the basal inferolateral wall; overall severe LV dysfunction. Compared to 12/15/19 MR is now severe.  LEFT VENTRICLE PLAX 2D LVIDd:         5.80 cm   Diastology LVIDs:         5.45 cm   LV e' medial:    3.92 cm/s LV PW:         1.00 cm   LV E/e' medial:  23.9 LV IVS:        0.80 cm   LV e' lateral:   4.68 cm/s LVOT diam:     2.20 cm   LV E/e' lateral: 20.0 LV SV:         53 LV SV Index:   26 LVOT Area:     3.80 cm                           3D Volume EF:                          3D EF:        15 %                          LV EDV:       163 ml                          LV ESV:       139 ml                          LV SV:        24 ml RIGHT VENTRICLE RV S prime:     9.68 cm/s TAPSE (M-mode): 1.7 cm LEFT ATRIUM              Index        RIGHT ATRIUM           Index LA diam:        5.00 cm  2.47 cm/m   RA Area:     31.40 cm LA Vol (A2C):   104.0 ml 51.39 ml/m  RA Volume:   123.00 ml 60.78 ml/m LA Vol (A4C):   82.3 ml  40.67 ml/m LA Biplane Vol: 94.9 ml  46.89 ml/m  AORTIC VALVE LVOT Vmax:   60.80 cm/s LVOT Vmean:  37.233 cm/s LVOT VTI:    0.138 m  AORTA Ao Root diam: 3.00 cm Ao Asc diam:  3.30 cm MITRAL VALVE                 TRICUSPID VALVE MV Area (PHT): cm           TR Peak grad:   48.7 mmHg MV Decel Time: 224 msec      TR Vmax:        349.00 cm/s MR Peak grad:   136.0 mmHg MR Mean grad:   83.0 mmHg    SHUNTS MR Vmax:  583.00 cm/s  Systemic VTI:  0.14 m MR Vmean:       425.0 cm/s   Systemic Diam: 2.20 cm MR PISA:        2.26 cm MR PISA Radius: 0.60 cm MV E velocity: 93.80 cm/s MV A velocity: 42.45 cm/s MV E/A ratio:  2.21 Kirk Ruths MD Electronically signed by Kirk Ruths MD Signature Date/Time: 11/13/2020/11:35:30 AM    Final     ASSESSMENT & PLAN Stanley Moore 87 y.o. male with medical  history significant for congestive heart failure, type 2 diabetes, hyperlipidemia, hypertension, and atrial fibrillation who presents for evaluation of multiple falls and was found to be pancytopenic with a macrocytic anemia.  After review of the labs, review of the records, and discussion with the patient the patients findings are most consistent with a multifactorial pancytopenia.  It is possible that the patient has numerous unrelated cytopenias such as thrombocytopenia from cirrhosis with anemia from CKD as well as macrocytosis from alcohol consumption.  Liver disease with cirrhosis could potentially explain the mild leukopenia and thrombocytopenia, but should not cause an anemia.  Additionally it is possible the patient has a macrocytic anemia secondary to a bone marrow process.  Typically anemia is due to renal disease are microcytic or normocytic in nature.  As such I would recommend that we perform a robust work-up to include full nutritional panels, multiple myeloma work-up, hemolysis work-up, with consideration of bone marrow biopsy if no clear etiology can be found.  Prior Anemia Workup: 11/24/2020: vitamin B12 1143, folate 40,   #Macrocytic Anemia #Thrombocytopenia, moderate # Mild Leukopenia -- At this time the etiology for the patient's macrocytic anemia and other cytopenias is likely multifactorial.  He does have CKD with a current creatinine of 2.52.  Also his most recent CT imaging showed concern for cirrhotic morphology of the liver.  He consumes 1 glass of red wine nightly --Vitamin B12 and folate were normal on prior review, recommend further evaluation with methylmalonic acid and homocystine --Due to CKD and multiple bone fractures would recommend ordering SPEP and serum free light chains --We will also request LDH and reticulocyte panel. --Hematology will continue to follow  All questions were answered. The patient knows to call the clinic with any problems, questions or  concerns.  A total of more than 50 minutes were spent on this encounter with face-to-face time and non-face-to-face time, including preparing to see the patient, ordering tests and/or medications, counseling the patient and coordination of care as outlined above.   Ledell Peoples, MD Department of Hematology/Oncology [place] [facility] Phone: [phone] Pager: [phone] Email: [email or web address]  11/25/2020 7:13 PM

## 2020-11-25 NOTE — Plan of Care (Signed)

## 2020-11-25 NOTE — Plan of Care (Signed)
  Problem: Clinical Measurements: Goal: Ability to maintain clinical measurements within normal limits will improve Outcome: Progressing Goal: Diagnostic test results will improve Outcome: Progressing Goal: Cardiovascular complication will be avoided Outcome: Progressing   Problem: Activity: Goal: Risk for activity intolerance will decrease Outcome: Progressing   Problem: Coping: Goal: Level of anxiety will decrease Outcome: Progressing   Problem: Pain Managment: Goal: General experience of comfort will improve Outcome: Progressing

## 2020-11-25 NOTE — Progress Notes (Signed)
Family Medicine Teaching Service Daily Progress Note Intern Pager: [phone]  Patient name: Stanley Moore Medical record number: [id number] Date of birth: 04-18-1931 Age: 87 y.o. Gender: male  Primary Care Provider: Heywood Bene, PA-C Consultants: Cardiology Code Status: DNR/DNI  Pt Overview and Major Events to Date:  11/18-admitted  Assessment and Plan:  Stanley Moore is a 87 y.o. male presenting with weakness and low back pain s/p fall, found to have multiple transverse process fractures as well as left 12th rib fracture. PMH is significant for hypertension, hyperlipidemia, T2DM, CKD 3b, persistent A. 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   Weakness and low back pain s/p multiple falls  multiple transverse process fractures  left 12th rib fracture Patient with no complaints of pain today. He is breathing okay on room air. Patient recommended for CIR by OT, but family concerned that SNF may be better.   -Encourage incentive spirometry -Continue acetaminophen 650 mg q 6 hr PRN -Oxycodone 5 mg q 4 hrs PRN -Consider ortho outpatient for fractures -PT/OT   CHF exacerbation with HFrEF 25-30% ejection fraction  history of left bundle branch block  moderate mitral regurg UOP 1.950 L yesterday. He had 2+ pitting edema to level of shins bilaterally today. Lungs clear to auscultation.  -Strict I/O's -Daily weights -Lasix 40 mg IV BID  AKI on CKD stage IIIb Cr today 2.52 from 2.66 (2.90). Patient continues to receive diureses with decreasing trend in Cr -Continue lasix 40 mg IV BID  Persistent A. fib status post cardioversion in 2021 Home medications include amiodarone and Eliquis. -Continue amiodarone -Hold Eliquis -AM CBC  Thrombocytopenia PLT 84 today from 88 and 93 previously. Continuing to trend down.   -Continue Plavix, hold eliquis -F/u peripheral blood smear  Macrocytic anemia Hgb today 8.8, stable  from 8.6 yesterday with  MCV 114.8. Anemia macrocytic in nature, B12 elevated to 1,143, folate level normal at 40, less likely source of anemia. TSH level slightly elevated at 5.194.  AST and ALT wnl, less likely liver disease, unlikely to be drug related as patient not on concerning meds. Awaiting smear results.  -continue am CBC -Hold eliquis -Hematology consult -Ask pharmacy to reconcile for cause  DM2 CBG's yesterday range 162-129, stable -Holding Jardiance -Continue to monitor CBG  Constipation -Continue Miralax daily -Consider enima  FEN/GI: Carb modified/ heart healthy PPx: Lovenox Dispo:SNF in 2-3 days.   Subjective:  Patient sitting comfortably in chair, with no complaints of pain this morning.   Objective: Temp:  [97.8 F (36.6 C)-98.5 F (36.9 C)] 98.1 F (36.7 C) (11/20 0502) Pulse Rate:  [45-55] 55 (11/20 0502) Resp:  [16-19] 17 (11/20 0502) BP: (105-123)/(46-58) 114/57 (11/20 0502) SpO2:  [94 %-98 %] 98 % (11/20 0502) Physical Exam: General: Elderly, frail, white male, NASD, comfortable Cardiovascular: Distant heart sound, [facility] Respiratory: CTABL Abdomen: Soft, nttp, non-distended Extremities: Moving all extremities independently  Laboratory: Recent Labs  Lab 11/23/20 1619 11/23/20 1631 11/24/20 0301 11/25/20 0420  WBC 4.5  --  4.2 3.3*  HGB 7.4* 7.5* 8.6* 8.8*  HCT 23.5* 22.0* 26.4* 27.2*  PLT 93*  --  88* 84*   Recent Labs  Lab 11/23/20 1619 11/23/20 1631 11/24/20 0301 11/25/20 0420  NA 136 138 136 137  K 4.7 4.7 4.8 4.2  CL 115* 112* 112* 111  CO2 15*  --  15* 15*  BUN 99* 116* 94* 91*  CREATININE 2.68* 2.90* 2.66* 2.52*  CALCIUM 8.1*  --  8.2* 8.1*  PROT 6.1*  --  6.0*  --   BILITOT 0.7  --  1.6*  --   ALKPHOS 48  --  49  --   ALT 18  --  17  --   AST 13*  --  38  --   GLUCOSE 180* 177* 162* 129*      Imaging/Diagnostic Tests:   Holley Bouche, MD 11/25/2020, 6:20 AM PGY-1, [facility] Intern pager:  [phone], text pages welcome

## 2020-11-25 NOTE — Progress Notes (Signed)
Progress Note  Patient Name: Stanley Moore Date of Encounter: 11/25/2020  [facility] Cardiologist: Evalina Field, MD   Subjective   Patient looks much better today   Sitting up in bed   No brace on   Denies Chest or rib pain  Breathing is OK   Inpatient Medications    Scheduled Meds:  acetaminophen  650 mg Oral Q6H   Or   acetaminophen  650 mg Rectal Q6H   amiodarone  100 mg Oral Daily   clopidogrel  75 mg Oral Daily   enoxaparin (LOVENOX) injection  30 mg Subcutaneous Q24H   furosemide  40 mg Intravenous BID   polyethylene glycol  17 g Oral Daily   simvastatin  20 mg Oral QPM   spironolactone  25 mg Oral Daily   Continuous Infusions:  PRN Meds: oxyCODONE   Vital Signs    Vitals:   11/24/20 1634 11/24/20 2127 11/25/20 0502 11/25/20 0931  BP: (!) 111/48 (!) 123/58 (!) 114/57 (!) 107/35  Pulse: (!) 46 (!) 52 (!) 55 (!) 43  Resp: 18 16 17 18   Temp: 98 F (36.7 C) 98.5 F (36.9 C) 98.1 F (36.7 C) 97.7 F (36.5 C)  TempSrc:      SpO2: 95% 94% 98% 98%  Weight:      Height:        Intake/Output Summary (Last 24 hours) at 11/25/2020 1305 Last data filed at 11/25/2020 1221 Gross per 24 hour  Intake 120 ml  Output 2650 ml  Net -2530 ml   Last 3 Weights 11/24/2020 11/23/2020 11/01/2020  Weight (lbs) 199 lb 11.8 oz 188 lb 181 lb 9.6 oz  Weight (kg) 90.6 kg 85.276 kg 82.373 kg      Telemetry    Sinus bradycarida   50s  - Personally Reviewed  ECG    No new  - Personally Reviewed  Physical Exam   GEN: No acute distress.   Neck: No JVD Cardiac: RRR, no murmurs, rubs, or gallops.  Respiratory: Clear to auscultation bilaterally. GI: Soft, nontender, non-distended  MS: Tr to 1+ edema; No deformity. Neuro:  Nonfocal  Psych: Normal affect   Labs    High Sensitivity Troponin:   Recent Labs  Lab 11/23/20 1619 11/23/20 1944  TROPONINIHS 65* 64*     Chemistry Recent Labs  Lab 11/23/20 1619 11/23/20 1631 11/24/20 0301 11/25/20 0420   NA 136 138 136 137  K 4.7 4.7 4.8 4.2  CL 115* 112* 112* 111  CO2 15*  --  15* 15*  GLUCOSE 180* 177* 162* 129*  BUN 99* 116* 94* 91*  CREATININE 2.68* 2.90* 2.66* 2.52*  CALCIUM 8.1*  --  8.2* 8.1*  MG 2.3  --   --   --   PROT 6.1*  --  6.0*  --   ALBUMIN 2.9*  --  3.0*  --   AST 13*  --  38  --   ALT 18  --  17  --   ALKPHOS 48  --  49  --   BILITOT 0.7  --  1.6*  --   GFRNONAA 22*  --  23* 24*  ANIONGAP 6  --  9 11    Lipids No results for input(s): CHOL, TRIG, HDL, LABVLDL, LDLCALC, CHOLHDL in the last 168 hours.  Hematology Recent Labs  Lab 11/23/20 1619 11/23/20 1631 11/24/20 0301 11/25/20 0420  WBC 4.5  --  4.2 3.3*  RBC 1.92*  --  2.31*  2.37*  HGB 7.4* 7.5* 8.6* 8.8*  HCT 23.5* 22.0* 26.4* 27.2*  MCV 122.4*  --  114.3* 114.8*  MCH 38.5*  --  37.2* 37.1*  MCHC 31.5  --  32.6 32.4  RDW 19.2*  --  22.3* 22.3*  PLT 93*  --  88* 84*   Thyroid  Recent Labs  Lab 11/24/20 0832  TSH 5.194*    BNP Recent Labs  Lab 11/24/20 0301  BNP 1,126.0*    DDimer No results for input(s): DDIMER in the last 168 hours.   Radiology    CT ABDOMEN PELVIS WO CONTRAST  Result Date: 11/23/2020 CLINICAL DATA:  Recent fall with abdominal pain, initial encounter EXAM: CT ABDOMEN AND PELVIS WITHOUT CONTRAST TECHNIQUE: Multidetector CT imaging of the abdomen and pelvis was performed following the standard protocol without IV contrast. COMPARISON:  06/15/2020 FINDINGS: Lower chest: Small pleural effusions are noted bilaterally. Mild atelectatic changes are seen. No focal confluent infiltrate is noted. Hepatobiliary: Gallbladder is within normal limits. Some mild nodularity to the liver is noted suggestive of cirrhotic change. No focal mass is noted. Pancreas: Pancreas is predominately fatty infiltrated. No acute abnormality is noted. Spleen: Spleen is within normal limits. Adrenals/Urinary Tract: Adrenal glands are well visualized bilaterally. Left adrenal gland is unremarkable. Right  adrenal gland demonstrates a focal 18 mm lesion likely representing an adenoma but incompletely evaluated on this exam. The kidneys demonstrate bilateral renal cysts. The largest of these again lies on the left measuring up to 10 cm in greatest dimension. There simple in appearance. No renal calculi are identified. Fullness of the left ureter is noted proximally with distal tapering. No obstructive stone is noted. The bladder is well distended. Stomach/Bowel: Scattered diverticular change of the colon is seen. Herniation of a loop of sigmoid colon into a left inguinal hernia is noted without obstructive change or incarceration. More proximal colon appears unremarkable. The appendix is not well visualized. Small bowel and stomach appear within normal limits. Vascular/Lymphatic: Aortic atherosclerosis. No enlarged abdominal or pelvic lymph nodes. Reproductive: Prostate is unremarkable. Other: Bilateral inguinal hernias are noted left greater than right. Sigmoid colon is noted within the left inguinal hernia. Fat is noted within the right inguinal hernia. Musculoskeletal: Undisplaced fracture of the left twelfth rib is noted. Additionally there are mildly displaced fractures involving the left L1, left L2 and right L3 transverse processes. Additionally there is an anterior inferior corner fracture from the L1 vertebral body which will be better evaluated on the upcoming CT of the lumbar spine. IMPRESSION: Bilateral pleural effusions with mild atelectatic changes. Mild nodularity to the liver without focal mass likely representing mild cirrhosis. Right adrenal lesion likely representing an adenoma but incompletely evaluated on this exam. Given the patient's age no further follow-up is recommended. Diverticulosis without diverticulitis is noted. Bilateral inguinal hernias are noted with sigmoid colon herniating into the left inguinal hernia. Multiple transverse process fractures as well as a left twelfth rib fracture.  Additionally anterior inferior corner fracture of L1 is noted. This will be better evaluated on upcoming lumbar spine CT. Electronically Signed   By: Inez Catalina M.D.   On: 11/23/2020 19:30   CT L-SPINE NO CHARGE  Result Date: 11/23/2020 CLINICAL DATA:  Lower back pain, recent fall none EXAM: CT LUMBAR SPINE WITHOUT CONTRAST TECHNIQUE: Multidetector CT imaging of the lumbar spine was performed without intravenous contrast administration. Multiplanar CT image reconstructions were also generated. COMPARISON:  None. FINDINGS: Segmentation: 5 lumbar type vertebrae. Bilateral L5-S1 assimilation joints. Alignment:  Straightening of the normal lumbar lordosis. Mild retrolisthesis L4 on L5. Trace retrolisthesis L5 on S1. Vertebrae: Fracture through the anterior inferior endplate of L1 (series 13, image 37), which appears acute. Osseous bridging is noted along the anterior right aspect of the vertebral bodies, likely DISH. Osseous bridging is also noted along several of the spinous processes. Congenitally short pedicles, which narrow the AP diameter of the spinal canal. Paraspinal and other soft tissues: Please see same-day CT abdomen pelvis for soft tissue results. Disc levels: T12-L1: No significant disc bulge. No spinal canal stenosis or neural foraminal narrowing. L1-L2: Moderate disc bulge. Moderate facet arthropathy. Moderate spinal canal stenosis. No neural foraminal narrowing. L2-L3: Mild disc bulge. Mild facet arthropathy. No definite spinal canal stenosis. No neural foraminal narrowing. L3-L4: Calcified disc bulge. Moderate facet arthropathy. Mild spinal canal stenosis. Mild bilateral neural foraminal narrowing. L4-L5: Mild retrolisthesis with broad-based disc bulge. Moderate facet arthropathy. Ligamentum flavum hypertrophy. Severe spinal canal stenosis. Moderate bilateral neural foraminal narrowing. L5-S1: Trace retrolisthesis with minimal disc bulge. Moderate facet arthropathy. No spinal canal stenosis. Mild  bilateral neural foraminal narrowing. IMPRESSION: 1. Fracture through the anterior inferior endplate of L1, which appears acute or subacute. 2. Osseous bridging along the anterior aspect of the vertebral bodies, most likely diffuse idiopathic skeletal hyperostosis, with additional bridging along the spinous processes. 3. L4-L5 severe spinal canal stenosis and moderate bilateral neural foraminal narrowing. 4. L3-L4 mild spinal canal stenosis and mild bilateral neural foraminal narrowing. 5. For soft tissue findings, please see same-day CT abdomen pelvis. Electronically Signed   By: Merilyn Baba M.D.   On: 11/23/2020 19:36   DG Chest Port 1 View  Result Date: 11/23/2020 CLINICAL DATA:  Weakness, low back pain, recent fall EXAM: PORTABLE CHEST 1 VIEW COMPARISON:  10/13/2020 FINDINGS: Cardiomegaly without edema or CHF pattern. No pneumothorax. Interval development of a small left effusion. Chronic bibasilar interstitial changes and atelectasis. Trachea midline. Aorta atherosclerotic. Bones are osteopenic and degenerative changes of the spine. IMPRESSION: Stable cardiomegaly and basilar interstitial lung disease. Small left pleural effusion blunting the left costophrenic angle. Aortic Atherosclerosis (ICD10-I70.0). Electronically Signed   By: Jerilynn Mages.  Shick M.D.   On: 11/23/2020 15:57    Cardiac Studies   1. Compared to 12/15/19 MR is now severe. Global hypokinesis with akinesis of the basal inferolateral wall; overall severe LV dysfunction. 2. Left ventricular ejection fraction, by estimation, is 25 to 30%. The left ventricle has severely decreased function. The left ventricle demonstrates regional wall motion abnormalities (see scoring diagram/findings for description). The left ventricular internal cavity size was moderately dilated. Left ventricular diastolic parameters are consistent with Grade II diastolic dysfunction (pseudonormalization). Elevated left atrial pressure. 3. Right ventricular systolic  function is mildly reduced. The right ventricular size is mildly enlarged. There is severely elevated pulmonary artery systolic pressure. 4. 5. Left atrial size was severely dilated. 6. Right atrial size was severely dilated. The mitral valve is degenerative. Severe mitral valve regurgitation. No evidence of mitral stenosis. 7. The aortic valve is tricuspid. Aortic valve regurgitation is trivial. Mild aortic valve sclerosis is present, with no evidence of aortic valve stenosis. 8. The inferior vena cava is dilated in size with <50% respiratory variability, suggesting right atrial pressure of 15 mmHg. 9. Comparison(s): Prior EF 25-30%.  Patient Profile  Stanley Moore is a 87 y.o. male with a hx of CAD, LBBB, paroxysmal atrial fibrillation, diabetes mellitus, chronic systolic heart failure, severe mitral regurgitation, hypertension, hyperlipidemia and chronic kidney disease stage III/IV who is  being seen 11/24/2020 for the evaluation of CHF at the request of Dr. Andria Frames.  Assessment & Plan    1  PAF   Patient remains in sinus bradycardia     Rates 50s now   B BLockers stopped   Still on amiodarone 100   Follow  Repeat EKG   BP is OK   He is off of Eliquis due to multiple falls  and severe anemia (Hgb 7.4 on 11/18)   Keep on tele  2  CAD    Pt with only chest wall pain earlier.   Minimal elevation(flat) of troponin  60s    2  Acute on chronic systolic CHF    Volume does still appear up some  He is comfortable   I would pull back on diuretics today (lasix and aldactone) given Cr.  Cr improving but not baseline   Rechedk in AM     With multiple falls, renal insuff, bradycardia he is not on carvedilol, ACE/ARB or aldactone   Follow   Pt is fragile    3  Renal  As noted above    4  Heme  Hgb 7.4 2 days ago    Tx   Still 8.8   With BUN elevation concern for GI     Follow   Stay off Eliquis  ON Plavix with TIA    Would add protonix empirically   5  Hx HL  Keep on Zocor    For  questions or updates, please contact [facility] Please consult [email or web address] for contact info under        Signed, Dorris Carnes, MD  11/25/2020, 1:05 PM

## 2020-11-26 DIAGNOSIS — I480 Paroxysmal atrial fibrillation: Secondary | ICD-10-CM | POA: Diagnosis not present

## 2020-11-26 DIAGNOSIS — I340 Nonrheumatic mitral (valve) insufficiency: Secondary | ICD-10-CM | POA: Diagnosis not present

## 2020-11-26 DIAGNOSIS — S32009D Unspecified fracture of unspecified lumbar vertebra, subsequent encounter for fracture with routine healing: Secondary | ICD-10-CM | POA: Diagnosis not present

## 2020-11-26 DIAGNOSIS — I5022 Chronic systolic (congestive) heart failure: Secondary | ICD-10-CM | POA: Diagnosis not present

## 2020-11-26 DIAGNOSIS — W19XXXD Unspecified fall, subsequent encounter: Secondary | ICD-10-CM | POA: Diagnosis not present

## 2020-11-26 DIAGNOSIS — N179 Acute kidney failure, unspecified: Secondary | ICD-10-CM | POA: Diagnosis not present

## 2020-11-26 LAB — BASIC METABOLIC PANEL
Anion gap: 8 (ref 5–15)
BUN: 84 mg/dL — ABNORMAL HIGH (ref 8–23)
CO2: 17 mmol/L — ABNORMAL LOW (ref 22–32)
Calcium: 8.2 mg/dL — ABNORMAL LOW (ref 8.9–10.3)
Chloride: 113 mmol/L — ABNORMAL HIGH (ref 98–111)
Creatinine, Ser: 2.55 mg/dL — ABNORMAL HIGH (ref 0.61–1.24)
GFR, Estimated: 24 mL/min — ABNORMAL LOW (ref >60)
Glucose, Bld: 127 mg/dL — ABNORMAL HIGH (ref 70–99)
Potassium: 4.3 mmol/L (ref 3.5–5.1)
Sodium: 138 mmol/L (ref 135–145)

## 2020-11-26 LAB — CBC
HCT: 26.3 % — ABNORMAL LOW (ref 39.0–52.0)
Hemoglobin: 8.5 g/dL — ABNORMAL LOW (ref 13.0–17.0)
MCH: 37.3 pg — ABNORMAL HIGH (ref 26.0–34.0)
MCHC: 32.3 g/dL (ref 30.0–36.0)
MCV: 115.4 fL — ABNORMAL HIGH (ref 80.0–100.0)
Platelets: 81 10*3/uL — ABNORMAL LOW (ref 150–400)
RBC: 2.28 MIL/uL — ABNORMAL LOW (ref 4.22–5.81)
RDW: 21.7 % — ABNORMAL HIGH (ref 11.5–15.5)
WBC: 3.2 10*3/uL — ABNORMAL LOW (ref 4.0–10.5)
nRBC: 0 % (ref 0.0–0.2)

## 2020-11-26 LAB — HOMOCYSTEINE: Homocysteine: 14.9 umol/L (ref 0.0–21.3)

## 2020-11-26 LAB — PATHOLOGIST SMEAR REVIEW

## 2020-11-26 LAB — MAGNESIUM: Magnesium: 2.3 mg/dL (ref 1.7–2.4)

## 2020-11-26 MED ORDER — POLYETHYLENE GLYCOL 3350 17 G PO PACK
17.0000 g | PACK | Freq: Two times a day (BID) | ORAL | Status: DC
  Administered 2020-11-26 – 2020-11-28 (×4): 17 g via ORAL
  Filled 2020-11-26 (×4): qty 1

## 2020-11-26 MED ORDER — SENNA 8.6 MG PO TABS
1.0000 | ORAL_TABLET | Freq: Two times a day (BID) | ORAL | Status: DC
  Administered 2020-11-26 – 2020-11-28 (×5): 8.6 mg via ORAL
  Filled 2020-11-26 (×5): qty 1

## 2020-11-26 MED ORDER — INSULIN ASPART 100 UNIT/ML IJ SOLN: 6.0000 [IU] | Freq: Once | INTRAMUSCULAR | Status: DC

## 2020-11-26 NOTE — Consult Note (Signed)
[facility] Nurse wound consult note Consultation was completed by review of records, images and assistance from the bedside nurse/clinical staff.  Reason for Consult: DTI left and right upper inner buttock Discussed with beside nursing; they do not report issues. However when I met with patient he reports area that has been present x 2 weeks.  Wound type: ? Deep tissue pressure injury Pressure Injury POA: Yes, patient self reports present x 2 weeks.  Dressing procedure/placement/frequency: silicone foam to protect and insulate. Bedside nurse to place when patient is put back into the bed. He is currently in the chair and reports to this [place] nurse that "you can not see it with me standing" . I have requested staff to assess when they return him to the bed and if they need me to come back to assess I can. However with a DTI, silicone foam is the most appropriate treatment. Turning and repositioning at least every 2 hours.   Orders updated.   Discussed POC with patient and bedside nurse.  Re consult if needed, will not follow at this time. Thanks  Scout Gumbs [employer], RN,CWOCN, CNS, [place] [phone])

## 2020-11-26 NOTE — Hospital Course (Addendum)
Stanley Moore is a 87 y.o. male presenting with weakness and low back pain s/p fall, found to have multiple transverse process fractures as well as left 12th rib fracture. PMH is significant for hypertension, hyperlipidemia, T2DM, CKD 3b, persistent A. 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 His hospital course is below.   Weakness & Low Back Pain s/p Multiple Falls Multiple Transverse Process Fractures  Left 12th Rib Fx Patient presented to the ED after having multiple falls over the weekend where his legs felt like they were buckling. He denied any kind of lightheadedness, loss of consciousness, or head trauma. Today patient felt more weak than usual and had extreme pain with any movement after falling on chair on Monday. His family called his cardiologist and PCP who told him to call 911. EMS found patient to have heart rates of 48 and tried a fluid bolus but then had heart rates in the 30s on recordings with difficulty obtaining accurate readings. EMS initiated pacing 5 minutes before arriving to the ED which was subsequently stopped in ED. In the ED imaging of the chest showed stable cardiomegaly with basilar interstitial lung disease and left pleural effusion blunting the left costophrenic angle. CT abdomen pelvis showed multiple transverse process fractures L1-L3 and corner fracture of L1 vertebral body, as well as a left 12th rib fracture.  Labs showed normal potassium and magnesium, bicarb low at 15, and AKI to 2.68 on admission.  Troponins were trended which were 65>64.  Lactic acid was normal and had macrocytic anemia and thrombocytopenia discussed in problems below. Vitals in the ED showed bradycardia in the high 40s to 50s, some soft blood pressures to 110s over 40s-50s saturating in high 90s on room air. On exam patient laying flat on bed not able to move much without pain.  When laying completely flat and not moving patient rates his pain 0/10.  Patient continued to progress while inpatient, working with PT and OT with good pain control and improving mobility.    HFrEF 25-30%  Hx of left bundle branch block  Moderate mitral regurgitation   Patient is followed by cardiology at [place], last visit 11/01/2020. EKG on admission showed sinus bradycardia to 52.  Last echo on 11/13/2020 showed EF of 25-30% with severely decreased function of left ventricle with grade 2 diastolic dysfunction.  The mitral valve was degenerative and showed severe mitral valve regurgitation. Per cardiology office note, patient has refused cardiac catheterizations in the past, and does not want ICD placement, or invasive angiography, but wants conservative approach and is DNR.  Had conversations with cardiologist on risk of sudden cardiac death.  Wants just medical therapy. Home medications of carvedilol, lasix 20 mg daily, and spironolactone. While admitted patient was lightly diuresed and followed by cardiology. His volume status was improved with no pitting edema by discharge.   AKI on CKD Stage 4: improving AKI on admission with Cr to 2.69. Baseline Cr around 1.8 in 2022. UA was clear for infection and showed greater than 500 glucose. Says he has been drinking same amount as usual over this past week.  Creatinine improved throughout admission, was 2.14 on discharge. Recommend PCP to follow up.   Pancytopenia concerning for malignancy: stable Hemoglobin of 8.6 on admission with PLT 88, and WBC 4.2.  Past hemoglobin recorded in epic of 9.4 and 11.6 in 2022 and 2021.  MCV was elevated to 114.3.  Does not currently drink any alcohol but  had previous history of drinking 1 shot of liquor daily unsure of how many years.  AST/ALT were within normal limits on CMP at 13 and 18 respectively.  He had no altered mental status, and sensation was intact on feet bilaterally.  Says he has never gotten a colonoscopy before and his stools range from a pale gray color to  brown. Denies any hematuria or hematochezia.  No abdominal pain on examination.  Last TSH in 2021 of 2.74.  No active bleeding visualized. Patient was transfused 2 unit of pRBC to improve Hgb. Patient had positive FOBT and was offered EGD by gastroenterology, but declined, as he didn't want to spend more time in the hospital. Patient was tested for cause of macrocytic anemia: LDH and reticulocytes were measured to evaluate for hemolysis, and were non-concerning. Homocysteine and Methylmalonic acid were wnl non-concerning for B12 or Folate deficiency.   A blood smear was positive for burr cells, and thrombocytopenia. Serum free light chains (kappa and lambda), were elevated. All three cell lines continued to trend down, patient received bone marrow biopsy that was not resulted by discharge.  Urinary Retention  BPH Patient developed issue retaining urine, found on bladder scan. After 3rd episode, foley was placed. Foley placed on 12/01/20.  Goals of Care Patient met with Chaplain services and notary 12/03/20 to name Gerald Leitz as HCPOA, with Foye Deer as next choice.    All other problems were chronic and stable  Issues for follow up: 1) Consider ortho outpatient for fractures 2) Elevated TSH, consider follow-up outpatient for repeat. Possibly elevated as an acute phase reactant. 3) Discontinue Eliquis due to high risk of falls; elevated HASBLED score.  4) Follow up bone marrow biopsy results 5) Consider follow up positive FOBT 6) He has 01/14/21 f/u with Primary cardiologist 7) AKI: Baseline 1.8. Cr 2.14 by discharge. Recommend PCP to recheck at follow up.

## 2020-11-26 NOTE — Progress Notes (Signed)
Physical Therapy Treatment Patient Details Name: Stanley Moore MRN: [id number] DOB: 1931/05/07 Today's Date: 11/26/2020   History of Present Illness 87 y.o. male presents to [facility] hospital on 11/23/2020 after multiple falls. PT found to have L 12th rib fx as well as L1-3 TP fxs and L1 corner fx of vertebral body. Hgb 7.4 on admission. PMH includes HTN, HLD, T2DM, CKD 3b, persistent A. 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    PT Comments    Patient progressing this session with in room then some hallway ambulation.  Mild dyspnea noted after ambulation, but VSS.  Noted knee buckling intermittent with ambulation and assisted pt able to recover without LOB.  Patient, however, remains high risk for falls.  Situated back brace x 2 during session, but pt not complaining of much pain today.  PT will continue to follow.  Continue to recommend SNF level rehab at d/c.    Recommendations for follow up therapy are one component of a multi-disciplinary discharge planning process, led by the attending physician.  Recommendations may be updated based on patient status, additional functional criteria and insurance authorization.  Follow Up Recommendations  Skilled nursing-short term rehab (<3 hours/day)     Assistance Recommended at Discharge Intermittent Supervision/Assistance  Equipment Recommendations  Wheelchair (measurements PT) (needs w/c if home)    Recommendations for Other Services       Precautions / Restrictions Precautions Precautions: Fall;Back Required Braces or Orthoses: Spinal Brace Spinal Brace: Thoracolumbosacral orthotic;Applied in sitting position     Mobility  Bed Mobility               General bed mobility comments: in recliner    Transfers Overall transfer level: Needs assistance Equipment used: Rolling walker (2 wheels) Transfers: Sit to/from Stand Sit to Stand: Min assist           General transfer comment:  heavy UE use on chair and decreased anterior weight shift    Ambulation/Gait Ambulation/Gait assistance: Min assist;Mod assist Gait Distance (Feet): 40 Feet (&20') Assistive device: Rolling walker (2 wheels) Gait Pattern/deviations: Step-through pattern;Decreased stride length;Knees buckling;Trunk flexed       General Gait Details: R knee buckle with initial stepping, mod A for safety and pt able to correct some, intermittent knee buckling with continued ambulation, support for safety, but no LOB; increased time and decreased safety on turns   Stairs             Wheelchair Mobility    Modified Rankin (Stroke Patients Only)       Balance Overall balance assessment: Needs assistance;History of Falls   Sitting balance-Leahy Scale: Poor Sitting balance - Comments: reliant on unilateral UE support   Standing balance support: Bilateral upper extremity supported;Reliant on assistive device for balance Standing balance-Leahy Scale: Poor                              Cognition Arousal/Alertness: Awake/alert Behavior During Therapy: WFL for tasks assessed/performed Overall Cognitive Status: Within Functional Limits for tasks assessed Area of Impairment: Memory                     Memory: Decreased recall of precautions                  Exercises      General Comments General comments (skin integrity, edema, etc.): HR 64 after ambulation, dysnea 2/4  Pertinent Vitals/Pain Faces Pain Scale: Hurts a little bit Pain Location: low back with mobility Pain Descriptors / Indicators: Aching Pain Intervention(s): Monitored during session    Home Living                          Prior Function            PT Goals (current goals can now be found in the care plan section) Progress towards PT goals: Progressing toward goals    Frequency    Min 3X/week      PT Plan Current plan remains appropriate    Co-evaluation               AM-PAC PT "6 Clicks" Mobility   Outcome Measure  Help needed turning from your back to your side while in a flat bed without using bedrails?: A Little Help needed moving from lying on your back to sitting on the side of a flat bed without using bedrails?: A Little Help needed moving to and from a bed to a chair (including a wheelchair)?: A Little Help needed standing up from a chair using your arms (e.g., wheelchair or bedside chair)?: A Little Help needed to walk in hospital room?: A Lot Help needed climbing 3-5 steps with a railing? : Total 6 Click Score: 15    End of Session Equipment Utilized During Treatment: Gait belt;Back brace Activity Tolerance: Patient tolerated treatment well Patient left: in chair;with call bell/phone within reach;with chair alarm set   PT Visit Diagnosis: Other abnormalities of gait and mobility (R26.89);Muscle weakness (generalized) (M62.81)     Time: 1120-1140 PT Time Calculation (min) (ACUTE ONLY): 20 min  Charges:  $Gait Training: 8-22 mins                     Stanley Moore, PT Acute Rehabilitation Services [phone] [phone] 11/26/2020    Stanley Moore 11/26/2020, 1:54 PM

## 2020-11-26 NOTE — Progress Notes (Signed)
Family Medicine Teaching Service Daily Progress Note Intern Pager: [phone]  Patient name: Stanley Moore Medical record number: [id number] Date of birth: 26-Mar-1931 Age: 87 y.o. Gender: male  Primary Care Provider: Heywood Bene, PA-C Consultants: Cardiology, Oncology Code Status: DNR/DNI  Pt Overview and Major Events to Date:  11/18-admitted  Assessment and Plan:  Stanley Moore is a 87 y.o. male presenting with weakness and low back pain s/p fall, found to have multiple transverse process fractures as well as left 12th rib fracture. PMH is significant for hypertension, hyperlipidemia, T2DM, CKD 3b, persistent A. 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   Weakness and low back pain s/p multiple falls  multiple transverse process fractures  left 12th rib fracture Patient pain today is well controled. Patient has no complaints of pain and was able to get up from the bed and walk to the chair. Patient and family preferring SNF for disposition.  -Encourage spirometry -Continue Acetaminophen 650 q6hr prn -Continue oxycodone 5 mg q4hr prn -PT/OT   CHF exacerbation with HFrEF 25-30% ejection fraction  history of left bundle branch block  moderate mitral regurg UOP 2.55 L yesterday, patient volume status was stable with pitting edema to level of shin on exam this morning. Lung exam was clear.  -Cards following appreciate recs -Hold  lasix 40 BID -Daily weights -Strict I/O  AKI on CKD stage IIIb Cr today 2.55 today, slightly elevated from yesterday 2.52. Lasix held.   Persistent A. fib status post cardioversion in 2021 Home medications include amiodarone and Eliquis. Patient had episode of bigeminy overnight.  -Continue amiodarone -Hold Eliquis -AM CBC  Thrombocytopenia  Macrocytic anemia Hgb 8.5 today, from 8.8 yesterday (8.6), likely stable. PLT 81, from 84 (88) previously. Per oncology recommendations, will  follow up on: -Methylmalonic acid and homocystine -SPEP and serum free light chains (kappa and lambda) -LDH and reticulocyte panel: LDH normal -Follow up blood smear -Oncology following, appreciate recs -Hold Eliquis -Ask pharmacy to reconcile med for potential cause   DM2 CBG range 129-127 -Hold Jardiance -Continue to monitor CBGs  Constipation -Consider enima -Increase Mirlax 34 g daily -Start Senna   FEN/GI: Carb modified/ heart healthy PPx: Lovenox Dispo:SNF in 2-3 days.  Subjective:  Patient is comfortable today with no complaints or concerns for pain.   Objective: Temp:  [97.7 F (36.5 C)-98.2 F (36.8 C)] 98.2 F (36.8 C) (11/21 0451) Pulse Rate:  [43-88] 88 (11/21 0451) Resp:  [16-18] 17 (11/21 0451) BP: (107-115)/(35-50) 114/50 (11/21 0451) SpO2:  [98 %-100 %] 100 % (11/21 0451) Physical Exam: General: Frail, elderly, well appearing, white male, NASD Cardiovascular: distant heart sounds, peripheral pulses intact, patient warm and well perfused Respiratory: CTABL Abdomen: Soft, NTTP, non-distended Extremities: Moving all extremities independently, walking with rolling walker.   Laboratory: Recent Labs  Lab 11/24/20 0301 11/25/20 0420 11/26/20 0246  WBC 4.2 3.3* 3.2*  HGB 8.6* 8.8* 8.5*  HCT 26.4* 27.2* 26.3*  PLT 88* 84* 81*   Recent Labs  Lab 11/23/20 1619 11/23/20 1631 11/24/20 0301 11/25/20 0420 11/26/20 0246  NA 136   < > 136 137 138  K 4.7   < > 4.8 4.2 4.3  CL 115*   < > 112* 111 113*  CO2 15*  --  15* 15* 17*  BUN 99*   < > 94* 91* 84*  CREATININE 2.68*   < > 2.66* 2.52* 2.55*  CALCIUM 8.1*  --  8.2* 8.1* 8.2*  PROT 6.1*  --  6.0*  --   --   BILITOT 0.7  --  1.6*  --   --   ALKPHOS 48  --  49  --   --   ALT 18  --  17  --   --   AST 13*  --  38  --   --   GLUCOSE 180*   < > 162* 129* 127*   < > = values in this interval not displayed.      Imaging/Diagnostic Tests:   Holley Bouche, MD 11/26/2020, 6:32 AM PGY-1, [place] Intern pager: [phone], text pages welcome

## 2020-11-26 NOTE — Progress Notes (Signed)
Patient has stayed OOB, sitting up in chair all day.  Ambulated around the room but has refused to go back in bed for assessment of pressure injury.  Back brace has been in use.  Will assess when patient gets back in bed which he said maybe before bedtime.

## 2020-11-26 NOTE — Progress Notes (Signed)
FPTS Brief Progress Note  S: Patient asleep, in no distress.  O: BP (!) 112/37 (BP Location: Left Arm)   Pulse (!) 58   Temp 98.1 F (36.7 C)   Resp 17   Ht 5\' 11"  (1.803 m)   Wt 90.6 kg   SpO2 99%   BMI 27.86 kg/m   Gen: Asleep, in no distress Resp: Normal work of breathing Cards: Tele showing ventricular bigeminy   A/P: Multiple Transverse Process Fx Left 12th rib fx -Continue pain regimen; can increase if pain is not controlled -PT/OT -Dispo SNF vs CIR?  Acute on Chronic Systolic CHF Exacerbation -Cardiology following   Ventricular Bigeminy  On Tele monitor. Causes could include CHF exacerbation and/or anemia.  -Continue on tele -Continue treating both CHF and anemia  Macrocytic Anemia LDH returned WNL. Retic panel with elevated immature reticular fraction at 18.8%.  -Heme following -F/u SPEP, light chain, homocysteine, MMA   - Orders reviewed. Labs for AM ordered, which was adjusted as needed.    Sharion Settler, DO 11/26/2020, 12:19 AM PGY-2, [place] [facility] Night Resident  Please page [phone] with questions.

## 2020-11-26 NOTE — Care Management Important Message (Signed)
Important Message  Patient Details  Name: Stanley Moore MRN: [id number] Date of Birth: April 08, 1931   Medicare Important Message Given:  Yes     Orbie Pyo 11/26/2020, 2:12 PM

## 2020-11-26 NOTE — Progress Notes (Signed)
Progress Note  Patient Name: Stanley Moore Date of Encounter: 11/26/2020  [facility] Cardiologist: Evalina Field, MD   Subjective   Breathing is OK  NO CP   Inpatient Medications    Scheduled Meds:  acetaminophen  650 mg Oral Q6H   Or   acetaminophen  650 mg Rectal Q6H   amiodarone  100 mg Oral Daily   clopidogrel  75 mg Oral Daily   enoxaparin (LOVENOX) injection  30 mg Subcutaneous Q24H   polyethylene glycol  17 g Oral Daily   simvastatin  20 mg Oral QPM   Continuous Infusions:  PRN Meds: oxyCODONE   Vital Signs    Vitals:   11/25/20 2136 11/26/20 0451 11/26/20 0649 11/26/20 0944  BP: (!) 112/37 (!) 114/50  100/69  Pulse: (!) 58 88  (!) 54  Resp: 17 17  14   Temp: 98.1 F (36.7 C) 98.2 F (36.8 C)  97.6 F (36.4 C)  TempSrc:    Oral  SpO2: 99% 100%  100%  Weight:   86.6 kg   Height:        Intake/Output Summary (Last 24 hours) at 11/26/2020 1005 Last data filed at 11/26/2020 0451 Gross per 24 hour  Intake 240 ml  Output 2300 ml  Net -2060 ml   Last 3 Weights 11/26/2020 11/24/2020 11/23/2020  Weight (lbs) 190 lb 14.7 oz 199 lb 11.8 oz 188 lb  Weight (kg) 86.6 kg 90.6 kg 85.276 kg      Telemetry      Sinus bradycardia   40s to 50s  - Personally Reviewed  ECG    No new  - Personally Reviewed  Physical Exam   GEN: No acute distress.  Sitting in chair with brace on Neck: No JVD Cardiac: RRR, no murmurs, rubs  Respiratory: Clear to auscultation bilaterally. GI: Soft, nontender, non-distended  MS: Tr to 1+ edema; No deformity. Neuro:  Nonfocal  Psych: Normal affect   Labs    High Sensitivity Troponin:   Recent Labs  Lab 11/23/20 1619 11/23/20 1944  TROPONINIHS 65* 64*     Chemistry Recent Labs  Lab 11/23/20 1619 11/23/20 1631 11/24/20 0301 11/25/20 0420 11/26/20 0246  NA 136   < > 136 137 138  K 4.7   < > 4.8 4.2 4.3  CL 115*   < > 112* 111 113*  CO2 15*  --  15* 15* 17*  GLUCOSE 180*   < > 162* 129* 127*   BUN 99*   < > 94* 91* 84*  CREATININE 2.68*   < > 2.66* 2.52* 2.55*  CALCIUM 8.1*  --  8.2* 8.1* 8.2*  MG 2.3  --   --   --  2.3  PROT 6.1*  --  6.0*  --   --   ALBUMIN 2.9*  --  3.0*  --   --   AST 13*  --  38  --   --   ALT 18  --  17  --   --   ALKPHOS 48  --  49  --   --   BILITOT 0.7  --  1.6*  --   --   GFRNONAA 22*  --  23* 24* 24*  ANIONGAP 6  --  9 11 8    < > = values in this interval not displayed.    Lipids No results for input(s): CHOL, TRIG, HDL, LABVLDL, LDLCALC, CHOLHDL in the last 168 hours.  Hematology Recent Labs  Lab 11/24/20 0301 11/25/20 0420 11/25/20 1709 11/26/20 0246  WBC 4.2 3.3*  --  3.2*  RBC 2.31* 2.37* 2.34* 2.28*  HGB 8.6* 8.8*  --  8.5*  HCT 26.4* 27.2*  --  26.3*  MCV 114.3* 114.8*  --  115.4*  MCH 37.2* 37.1*  --  37.3*  MCHC 32.6 32.4  --  32.3  RDW 22.3* 22.3*  --  21.7*  PLT 88* 84*  --  81*   Thyroid  Recent Labs  Lab 11/24/20 0832  TSH 5.194*    BNP Recent Labs  Lab 11/24/20 0301  BNP 1,126.0*    DDimer No results for input(s): DDIMER in the last 168 hours.   Radiology    No results found.  Cardiac Studies   1. Compared to 12/15/19 MR is now severe. Global hypokinesis with akinesis of the basal inferolateral wall; overall severe LV dysfunction. 2. Left ventricular ejection fraction, by estimation, is 25 to 30%. The left ventricle has severely decreased function. The left ventricle demonstrates regional wall motion abnormalities (see scoring diagram/findings for description). The left ventricular internal cavity size was moderately dilated. Left ventricular diastolic parameters are consistent with Grade II diastolic dysfunction (pseudonormalization). Elevated left atrial pressure. 3. Right ventricular systolic function is mildly reduced. The right ventricular size is mildly enlarged. There is severely elevated pulmonary artery systolic pressure. 4. 5. Left atrial size was severely dilated. 6. Right atrial size  was severely dilated. The mitral valve is degenerative. Severe mitral valve regurgitation. No evidence of mitral stenosis. 7. The aortic valve is tricuspid. Aortic valve regurgitation is trivial. Mild aortic valve sclerosis is present, with no evidence of aortic valve stenosis. 8. The inferior vena cava is dilated in size with <50% respiratory variability, suggesting right atrial pressure of 15 mmHg. 9. Comparison(s): Prior EF 25-30%.  Patient Profile  Stanley Moore is a 87 y.o. male with a hx of CAD, LBBB, paroxysmal atrial fibrillation, diabetes mellitus, chronic systolic heart failure, severe mitral regurgitation, hypertension, hyperlipidemia and chronic kidney disease stage III/IV who is being seen 11/24/2020 for the evaluation of CHF at the request of Dr. Andria Frames.  Assessment & Plan    1  PAF   Patient remains in sinus bradycardia     Rates 50s now   He is off of Bblockers.   Still on amiodarone to maintain SR  100 mg    He is off of Eliquis due to multiple falls  and severe anemia (Hgb 7.4 on 11/18)   Keep on tele  2  CAD    Pt with only chest wall pain earlier.   Minimal elevation(flat) of troponin  60s    2  Acute on chronic systolic CHF    Volume does still appear up some  He is comfortable   I would pull back on diuretics today (lasix and aldactone) given Cr.  Cr improving but not baseline   Rechedk in AM     With multiple falls, renal insuff, bradycardia he is not on carvedilol, ACE/ARB or aldactone   Follow   Pt is fragile    3  Renal  Cr is still above baseline from Oct 2022 (1.85, 2.04)  Hold diuretics for now     4  Heme  Hgb 7.4 2 days ago   Transfused    Hgb is stable   Continue  Plavix with hx of TIA    Added  protonix empirically   5  Hx HL  Keep on Zocor  For questions or updates, please contact [place] Please consult [email or web address] for contact info under        Signed, Dorris Carnes, MD  11/26/2020, 10:05 AM

## 2020-11-26 NOTE — Progress Notes (Signed)
Inpatient Rehab Admissions Coordinator:  Awaiting pt/family decision regarding pursuing CIR. Will also need to begin insurance authorization once they have reached their decision. Will continue to follow.   Gayland Curry, [place], [place] Admissions Coordinator [phone]

## 2020-11-26 NOTE — Plan of Care (Signed)

## 2020-11-27 ENCOUNTER — Encounter (HOSPITAL_COMMUNITY): Payer: Self-pay | Admitting: Student

## 2020-11-27 DIAGNOSIS — D61818 Other pancytopenia: Secondary | ICD-10-CM

## 2020-11-27 DIAGNOSIS — N179 Acute kidney failure, unspecified: Secondary | ICD-10-CM | POA: Diagnosis not present

## 2020-11-27 DIAGNOSIS — S32009A Unspecified fracture of unspecified lumbar vertebra, initial encounter for closed fracture: Secondary | ICD-10-CM | POA: Diagnosis not present

## 2020-11-27 DIAGNOSIS — W19XXXD Unspecified fall, subsequent encounter: Secondary | ICD-10-CM | POA: Diagnosis not present

## 2020-11-27 DIAGNOSIS — I5022 Chronic systolic (congestive) heart failure: Secondary | ICD-10-CM | POA: Diagnosis not present

## 2020-11-27 DIAGNOSIS — I340 Nonrheumatic mitral (valve) insufficiency: Secondary | ICD-10-CM | POA: Diagnosis not present

## 2020-11-27 DIAGNOSIS — I480 Paroxysmal atrial fibrillation: Secondary | ICD-10-CM | POA: Diagnosis not present

## 2020-11-27 LAB — KAPPA/LAMBDA LIGHT CHAINS
Kappa free light chain: 76.9 mg/L — ABNORMAL HIGH (ref 3.3–19.4)
Kappa, lambda light chain ratio: 1.74 — ABNORMAL HIGH (ref 0.26–1.65)
Lambda free light chains: 44.3 mg/L — ABNORMAL HIGH (ref 5.7–26.3)

## 2020-11-27 LAB — CBC
HCT: 25 % — ABNORMAL LOW (ref 39.0–52.0)
Hemoglobin: 8.2 g/dL — ABNORMAL LOW (ref 13.0–17.0)
MCH: 37.4 pg — ABNORMAL HIGH (ref 26.0–34.0)
MCHC: 32.8 g/dL (ref 30.0–36.0)
MCV: 114.2 fL — ABNORMAL HIGH (ref 80.0–100.0)
Platelets: 82 10*3/uL — ABNORMAL LOW (ref 150–400)
RBC: 2.19 MIL/uL — ABNORMAL LOW (ref 4.22–5.81)
RDW: 21.1 % — ABNORMAL HIGH (ref 11.5–15.5)
WBC: 2.9 10*3/uL — ABNORMAL LOW (ref 4.0–10.5)
nRBC: 0.7 % — ABNORMAL HIGH (ref 0.0–0.2)

## 2020-11-27 LAB — HEPATIC FUNCTION PANEL
ALT: 16 U/L (ref 0–44)
AST: 21 U/L (ref 15–41)
Albumin: 2.8 g/dL — ABNORMAL LOW (ref 3.5–5.0)
Alkaline Phosphatase: 45 U/L (ref 38–126)
Bilirubin, Direct: 0.3 mg/dL — ABNORMAL HIGH (ref 0.0–0.2)
Indirect Bilirubin: 0.8 mg/dL (ref 0.3–0.9)
Total Bilirubin: 1.1 mg/dL (ref 0.3–1.2)
Total Protein: 5.7 g/dL — ABNORMAL LOW (ref 6.5–8.1)

## 2020-11-27 LAB — BASIC METABOLIC PANEL
Anion gap: 6 (ref 5–15)
BUN: 80 mg/dL — ABNORMAL HIGH (ref 8–23)
CO2: 18 mmol/L — ABNORMAL LOW (ref 22–32)
Calcium: 8.2 mg/dL — ABNORMAL LOW (ref 8.9–10.3)
Chloride: 112 mmol/L — ABNORMAL HIGH (ref 98–111)
Creatinine, Ser: 2.33 mg/dL — ABNORMAL HIGH (ref 0.61–1.24)
GFR, Estimated: 26 mL/min — ABNORMAL LOW (ref >60)
Glucose, Bld: 139 mg/dL — ABNORMAL HIGH (ref 70–99)
Potassium: 4.2 mmol/L (ref 3.5–5.1)
Sodium: 136 mmol/L (ref 135–145)

## 2020-11-27 MED ORDER — ZINC OXIDE 40 % EX OINT
TOPICAL_OINTMENT | Freq: Every day | CUTANEOUS | Status: DC | PRN
  Filled 2020-11-27: qty 57

## 2020-11-27 NOTE — Progress Notes (Signed)
Occupational Therapy Treatment Patient Details Name: Stanley Moore MRN: [id number] DOB: May 18, 1931 Today's Date: 11/27/2020   History of present illness 87 y.o. male presents to [facility] hospital on 11/23/2020 after multiple falls. PT found to have L 12th rib fx as well as L1-3 TP fxs and L1 corner fx of vertebral body. Hgb 7.4 on admission. PMH includes HTN, HLD, T2DM, CKD 3b, persistent A. 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   OT comments  Pt presented in chair. Pt required max assist to reposition TLSO, moderate cues to recall back precautions and follow through with ADLS. Pt required min guard for sit to stand transfer to walker but min guard to min assist due to use RW due to B knee buckling but no LOB. Pt required total assist for hygiene as noted to be soiled when in standing.  Pt currently with functional limitations due to the deficits listed below (see OT Problem List).  Pt will benefit from skilled OT to increase their safety and independence with ADL and functional mobility for ADL to facilitate discharge to venue listed below.     Recommendations for follow up therapy are one component of a multi-disciplinary discharge planning process, led by the attending physician.  Recommendations may be updated based on patient status, additional functional criteria and insurance authorization.    Follow Up Recommendations  Skilled nursing-short term rehab (<3 hours/day)    Assistance Recommended at Discharge Frequent or constant Supervision/Assistance  Equipment Recommendations  BSC/3in1;Other (comment)    Recommendations for Other Services      Precautions / Restrictions Precautions Precautions: Fall;Back Precaution Booklet Issued: No Required Braces or Orthoses: Spinal Brace Spinal Brace: Thoracolumbosacral orthotic;Applied in sitting position Restrictions Weight Bearing Restrictions: No       Mobility Bed Mobility Overal bed  mobility:  (PT OOB)                  Transfers Overall transfer level: Needs assistance Equipment used: Rolling walker (2 wheels) Transfers: Sit to/from Stand Sit to Stand: Min guard           General transfer comment: Pt requires moderat to max cues for saftey and on first attempt B knee buckling but able to use RW with UE without LOB     Balance Overall balance assessment: Needs assistance;History of Falls Sitting-balance support: Feet supported;Bilateral upper extremity supported Sitting balance-Leahy Scale: Fair     Standing balance support: Bilateral upper extremity supported Standing balance-Leahy Scale: Poor                             ADL either performed or assessed with clinical judgement   ADL Overall ADL's : Needs assistance/impaired Eating/Feeding: Independent;Sitting   Grooming: Min guard;Sitting   Upper Body Bathing: Minimal assistance;Cueing for safety;Cueing for sequencing;Sitting   Lower Body Bathing: Maximal assistance;Sit to/from stand   Upper Body Dressing : Moderate assistance;Sitting   Lower Body Dressing: Maximal assistance;Sit to/from stand   Toilet Transfer: Minimal assistance;Cueing for safety;Cueing for sequencing;Ambulation;BSC/3in1;Rolling walker (2 wheels)   Toileting- Clothing Manipulation and Hygiene: Sit to/from stand;Total assistance       Functional mobility during ADLs: Minimal assistance;Cueing for safety;Rolling walker (2 wheels)      Extremity/Trunk Assessment Upper Extremity Assessment Upper Extremity Assessment: Generalized weakness   Lower Extremity Assessment Lower Extremity Assessment: Defer to PT evaluation        Vision   Vision Assessment?: No apparent visual  deficits   Perception     Praxis      Cognition Arousal/Alertness: Awake/alert Behavior During Therapy: WFL for tasks assessed/performed Overall Cognitive Status: Within Functional Limits for tasks assessed Area of Impairment:  Memory;Following commands;Safety/judgement                     Memory: Decreased recall of precautions Following Commands: Follows one step commands consistently Safety/Judgement: Decreased awareness of safety     General Comments: with increase in cues in session able to follow through          Exercises     Shoulder Instructions       General Comments      Pertinent Vitals/ Pain       Pain Assessment: No/denies pain Breathing: normal Negative Vocalization: none  Home Living                                          Prior Functioning/Environment              Frequency  Min 2X/week        Progress Toward Goals  OT Goals(current goals can now be found in the care plan section)  Progress towards OT goals: Progressing toward goals  Acute Rehab OT Goals Patient Stated Goal: to keep being able to move OT Goal Formulation: With patient Time For Goal Achievement: 12/08/20 Potential to Achieve Goals: Good ADL Goals Pt Will Perform Grooming: with set-up;standing Pt Will Perform Lower Body Bathing: with supervision;with adaptive equipment;sit to/from stand Pt Will Perform Lower Body Dressing: with supervision;sit to/from stand;with adaptive equipment Pt Will Transfer to Toilet: with supervision;ambulating Additional ADL Goal #1: Pt to verbalize 3/3 back precautions Independently Additional ADL Goal #2: Pt to verbalize at least 3 fall prevention strategies to implement in home environment  Plan Discharge plan remains appropriate    Co-evaluation                 AM-PAC OT "6 Clicks" Daily Activity     Outcome Measure   Help from another person eating meals?: None Help from another person taking care of personal grooming?: A Little Help from another person toileting, which includes using toliet, bedpan, or urinal?: A Lot Help from another person bathing (including washing, rinsing, drying)?: A Lot Help from another person to  put on and taking off regular upper body clothing?: A Lot Help from another person to put on and taking off regular lower body clothing?: A Lot 6 Click Score: 15    End of Session Equipment Utilized During Treatment: Gait belt;Rolling walker (2 wheels);Back brace  OT Visit Diagnosis: Unsteadiness on feet (R26.81);Other abnormalities of gait and mobility (R26.89);Muscle weakness (generalized) (M62.81);History of falling (Z91.81)   Activity Tolerance Patient tolerated treatment well;Patient limited by pain   Patient Left in chair;with call bell/phone within reach;with chair alarm set   Nurse Communication Mobility status        Time: 7829-5621 OT Time Calculation (min): 22 min  Charges: OT General Charges $OT Visit: 1 Visit OT Evaluation $OT Eval Low Complexity: [place] OTR/L  Acute Rehab Services  [phone] office number [phone] pager number   Joeseph Amor 11/27/2020, 1:08 PM

## 2020-11-27 NOTE — TOC Initial Note (Signed)
Transition of Care [facility]) - Initial/Assessment Note    Patient Details  Name: Stanley Moore MRN: [id number] Date of Birth: 1931/12/06  Transition of Care [facility]) CM/SW Contact:    Stanley Moore, [place] Phone Number: 11/27/2020, 11:08 AM  Clinical Narrative:                 CSW received consult for possible SNF vs CIR placement at time of discharge.  CSW met with the patient at bedside who was alert and oriented x 4.  The patient informed CSW that his daughter Stanley Moore will be in charge of managing the discharge.  Patient gave CSW permission to speak with the daughter.  CSW spoke with the patient's daughter Stanley Moore, [phone].  [name] informed CSW that the family would prefer that the patient receive ST rehab at a SNF.  The family is concerned that IP rehab will be too intense for the patient at this time.  The daughter reports preference for a facility in [facility].  CSW discussed insurance authorization process and will provide Medicare SNF ratings list. Patient has received 2 COVID vaccines.  No further questions reported at this time.   Skilled Nursing Rehab Facilities-   [email or web address]   Ratings out of 5 possible   Name Address  Phone # [place] [facility] Overall  [facility] [address] [place] _0 [facility]  [address] [place], [place] [phone] _1 [facility] [place], [place] _2 [place] [address] [place] _3 [facility] [address] [place] _4 [place] _5 [facility] [address] [place] _6 [facility] [address], [place] _7 [place] [facility] [place] _8 [facility] [address], [place] [phone] _9 [facility] [address] [place] [phone] _10 [facility] [address] [place], [place] _11 Patient Goals and CMS Choice        Expected Discharge Plan and Services                                                Prior Living Arrangements/Services                       Activities of Daily Living Home Assistive Devices/Equipment: [name] (specify quad or straight) ADL Screening (condition at time of admission) Patient's cognitive ability adequate to safely complete daily activities?: Yes Is the patient deaf or have difficulty hearing?: No Does the patient have difficulty seeing, even when wearing glasses/contacts?: No Does the patient have difficulty concentrating, remembering, or making decisions?: No Patient able to express need for assistance with ADLs?: Yes Does the patient have difficulty dressing or bathing?: Yes Independently performs ADLs?: No Communication: Independent Dressing (OT): Needs assistance Is this a change from baseline?: Change from baseline, expected to last >3 days Grooming: Independent Feeding: Independent Bathing: Needs assistance Is  this a change from baseline?: Change from baseline, expected to last >3 days Toileting: Needs assistance Is this a change from baseline?: Change from baseline, expected to last >3days In/Out Bed: Needs assistance Is this a change from baseline?: Change from baseline, expected to last >3 days Walks in Home: Needs assistance Is this a change from baseline?: Change from baseline, expected to last >3 days Does the patient have difficulty walking or climbing stairs?: Yes Weakness of Legs: Both Weakness of Arms/Hands: Both  Permission Sought/Granted                  Emotional Assessment              Admission diagnosis:  Fall [W19.XXXA] AKI (acute kidney injury) ([place])  [N17.9] Closed fracture of transverse process of lumbar vertebra, initial encounter [facility]) [S32.009A] Patient Active Problem List   Diagnosis Date Noted   Fall    Closed fracture of transverse process of lumbar vertebra (HCC)    Macrocytic anemia    Chronic HFrEF (heart failure with reduced ejection fraction) (HCC)    PAF (paroxysmal atrial fibrillation) ([place])    AKI (acute kidney injury) ([place]) 11/23/2020   NSTEMI (non-ST elevated myocardial infarction) ([place]) 10/13/2020   Persistent atrial fibrillation ([place])    Unspecified essential hypertension 04/06/2012   Pure hypercholesterolemia 04/06/2012   Type II or unspecified type diabetes mellitus without mention of complication, not stated as uncontrolled 04/06/2012   PCP:  Heywood Bene, PA-C Pharmacy:   [facility] ORDER) [place], [place] [address] Phone: [phone] Fax: [phone]  [id number] [place] [address] [place] [place]Phone: [phone]: [phone]    Social Determinants of Health (SDOH) Interventions    Readmission Risk Interventions No flowsheet data found.

## 2020-11-27 NOTE — Progress Notes (Signed)
Progress Note  Patient Name: Stanley Moore Date of Encounter: 11/27/2020  [facility] Cardiologist: Evalina Field, MD   Subjective   Pt says his breathing is OK  Deniese CP   Inpatient Medications    Scheduled Meds:  acetaminophen  650 mg Oral Q6H   Or   acetaminophen  650 mg Rectal Q6H   amiodarone  100 mg Oral Daily   clopidogrel  75 mg Oral Daily   enoxaparin (LOVENOX) injection  30 mg Subcutaneous Q24H   polyethylene glycol  17 g Oral BID   senna  1 tablet Oral BID   simvastatin  20 mg Oral QPM   Continuous Infusions:  PRN Meds: oxyCODONE   Vital Signs    Vitals:   11/26/20 1700 11/26/20 1942 11/27/20 0421 11/27/20 0500  BP: 104/68 (!) 106/44 (!) 159/135   Pulse: (!) 55 (!) 51 (!) 52   Resp: 20 18    Temp: 99.3 F (37.4 C) 98 F (36.7 C) 98.1 F (36.7 C)   TempSrc:  Oral Oral   SpO2: 99% 99% 99%   Weight:    78.3 kg  Height:        Intake/Output Summary (Last 24 hours) at 11/27/2020 0635 Last data filed at 11/27/2020 0519 Gross per 24 hour  Intake 730 ml  Output 1975 ml  Net -1245 ml   Last 3 Weights 11/27/2020 11/26/2020 11/24/2020  Weight (lbs) 172 lb 9.6 oz 190 lb 14.7 oz 199 lb 11.8 oz  Weight (kg) 78.291 kg 86.6 kg 90.6 kg      Telemetry      Sinus bradycardia   40s to 50s    - Personally Reviewed  ECG    No new  - Personally Reviewed  Physical Exam   GEN: No acute distress.  Sitting in chair with brace on Neck: JVP is increased Cardiac: RRR, no murmurs, rubs  Respiratory: Clear to auscultation bilaterally. GI: Soft, nontender, non-distended  MS: Tr to 1+ edema; No deformity. Neuro:  Nonfocal  Psych: Normal affect   Labs    High Sensitivity Troponin:   Recent Labs  Lab 11/23/20 1619 11/23/20 1944  TROPONINIHS 65* 64*     Chemistry Recent Labs  Lab 11/23/20 1619 11/23/20 1631 11/24/20 0301 11/25/20 0420 11/26/20 0246 11/27/20 0230  NA 136   < > 136 137 138 136  K 4.7   < > 4.8 4.2 4.3 4.2  CL 115*    < > 112* 111 113* 112*  CO2 15*  --  15* 15* 17* 18*  GLUCOSE 180*   < > 162* 129* 127* 139*  BUN 99*   < > 94* 91* 84* 80*  CREATININE 2.68*   < > 2.66* 2.52* 2.55* 2.33*  CALCIUM 8.1*  --  8.2* 8.1* 8.2* 8.2*  MG 2.3  --   --   --  2.3  --   PROT 6.1*  --  6.0*  --   --   --   ALBUMIN 2.9*  --  3.0*  --   --   --   AST 13*  --  38  --   --   --   ALT 18  --  17  --   --   --   ALKPHOS 48  --  49  --   --   --   BILITOT 0.7  --  1.6*  --   --   --   GFRNONAA 22*  --  23* 24* 24* 26*  ANIONGAP 6  --  9 11 8 6    < > = values in this interval not displayed.    Lipids No results for input(s): CHOL, TRIG, HDL, LABVLDL, LDLCALC, CHOLHDL in the last 168 hours.  Hematology Recent Labs  Lab 11/25/20 0420 11/25/20 1709 11/26/20 0246 11/27/20 0230  WBC 3.3*  --  3.2* 2.9*  RBC 2.37* 2.34* 2.28* 2.19*  HGB 8.8*  --  8.5* 8.2*  HCT 27.2*  --  26.3* 25.0*  MCV 114.8*  --  115.4* 114.2*  MCH 37.1*  --  37.3* 37.4*  MCHC 32.4  --  32.3 32.8  RDW 22.3*  --  21.7* 21.1*  PLT 84*  --  81* 82*   Thyroid  Recent Labs  Lab 11/24/20 0832  TSH 5.194*    BNP Recent Labs  Lab 11/24/20 0301  BNP 1,126.0*    DDimer No results for input(s): DDIMER in the last 168 hours.   Radiology    No results found.  Cardiac Studies   1. Compared to 12/15/19 MR is now severe. Global hypokinesis with akinesis of the basal inferolateral wall; overall severe LV dysfunction. 2. Left ventricular ejection fraction, by estimation, is 25 to 30%. The left ventricle has severely decreased function. The left ventricle demonstrates regional wall motion abnormalities (see scoring diagram/findings for description). The left ventricular internal cavity size was moderately dilated. Left ventricular diastolic parameters are consistent with Grade II diastolic dysfunction (pseudonormalization). Elevated left atrial pressure. 3. Right ventricular systolic function is mildly reduced. The right ventricular size  is mildly enlarged. There is severely elevated pulmonary artery systolic pressure. 4. 5. Left atrial size was severely dilated. 6. Right atrial size was severely dilated. The mitral valve is degenerative. Severe mitral valve regurgitation. No evidence of mitral stenosis. 7. The aortic valve is tricuspid. Aortic valve regurgitation is trivial. Mild aortic valve sclerosis is present, with no evidence of aortic valve stenosis. 8. The inferior vena cava is dilated in size with <50% respiratory variability, suggesting right atrial pressure of 15 mmHg. 9. Comparison(s): Prior EF 25-30%.  Patient Profile  Stanley Moore is a 87 y.o. male with a hx of CAD, LBBB, paroxysmal atrial fibrillation, diabetes mellitus, chronic systolic heart failure, severe mitral regurgitation, hypertension, hyperlipidemia and chronic kidney disease stage III/IV who is being seen 11/24/2020 for the evaluation of CHF at the request of Dr. Andria Frames.  Assessment & Plan    1  PAF   Patient still with  sinus bradycardia     Rates 40s to 50s    He is off of Bblockers.   Still on amiodarone to maintain SR  100 mg   daily  He is off of Eliquis due to multiple falls  and severe anemia (Hgb 7.4 on 11/18)   Keep on tele CHADSVASc is 4   Higher risk for CVA but in setting of current presention, need to document how stable he is on feet   And, concern for bleeding .      2  CAD    Pt with only chest wall pain earlier.   Minimal elevation(flat) of troponin  60s    2  Acute on chronic systolic  Diuretics have been held with renal insuff  Cr is improving but still above baseline    Pt comfortable   Follow for now    With multiple falls, renal insuff, bradycardia he is not on carvedilol, ACE/ARB or aldactone   Follow  Pt is fragile    3  Renal  Cr is still above baseline from Oct 2022 (1.85, 2.04)  continue to hold diuretics for now     4  Heme  Hgb 7.4 2 days ago   Transfused    Hgb is still above 8   Continue  Plavix with  hx of TIA    Added  protonix empirically   5  Hx HL  Keep on Zocor    For questions or updates, please contact [facility] Please consult [email or web address] for contact info under        Signed, Dorris Carnes, MD  11/27/2020, 6:35 AM

## 2020-11-27 NOTE — Plan of Care (Signed)
  Problem: Education: Goal: Knowledge of General Education information will improve Description: Including pain rating scale, medication(s)/side effects and non-pharmacologic comfort measures Outcome: Progressing   Problem: Clinical Measurements: Goal: Cardiovascular complication will be avoided Outcome: Progressing   Problem: Activity: Goal: Risk for activity intolerance will decrease Outcome: Progressing   Problem: Coping: Goal: Level of anxiety will decrease Outcome: Progressing   Problem: Elimination: Goal: Will not experience complications related to bowel motility Outcome: Progressing   Problem: Pain Managment: Goal: General experience of comfort will improve Outcome: Progressing

## 2020-11-27 NOTE — Progress Notes (Signed)
Inpatient Rehab Admissions Coordinator:   Note plans for SNF placement for STR.  CIR will sign off at this time.   Shann Medal, PT, DPT Admissions Coordinator [phone] 11/27/20  11:45 AM

## 2020-11-27 NOTE — Progress Notes (Signed)
Chief Complaint: Patient was seen in consultation today for bone marrow biopsy  Referring Physician(s): Dr. Holley Bouche  Supervising Physician: Mir, Sharen Heck  Patient Status: [facility] - In-pt  History of Present Illness: Stanley Moore is a 87 y.o. male admitted after multiple falls. Has multiple medical issues but has been found to have pancytopenia. As part of his workup, bone marrow biopsy has been requested. PMHx, meds, labs, imaging, allergies reviewed.  Pt sitting up in chair, offers no complaints    Past Medical History:  Diagnosis Date   CHF (congestive heart failure) (HCC)    CRI (chronic renal insufficiency), stage 3 (moderate) (HCC)    Diabetes mellitus without complication (HCC)    Hyperlipidemia    Hypertension    LBBB (left bundle branch block)    Moderate mitral regurgitation    Paroxysmal atrial fibrillation [facility])     Past Surgical History:  Procedure Laterality Date   appendicitis     CARDIOVERSION N/A 06/27/2019   Procedure: CARDIOVERSION;  Surgeon: Elouise Munroe, MD;  Location: [facility] ENDOSCOPY;  Service: Cardiovascular;  Laterality: N/A;   MOLE REMOVAL      Allergies: Patient has no known allergies.  Medications: Prior to Admission medications   Medication Sig Start Date End Date Taking? Authorizing Provider  amiodarone (PACERONE) 100 MG tablet TAKE 1 TABLET BY MOUTH EVERY DAY 10/01/20  Yes O'Neal, Cassie Freer, MD  apixaban (ELIQUIS) 2.5 MG TABS tablet TAKE 1 TABLET BY MOUTH TWICE A DAY 09/12/20  Yes O'Neal, Cassie Freer, MD  carvedilol (COREG) 12.5 MG tablet Take 1 tablet (12.5 mg total) by mouth 2 (two) times daily. 11/01/20  Yes O'Neal, Cassie Freer, MD  clopidogrel (PLAVIX) 75 MG tablet Take 1 tablet (75 mg total) by mouth daily. 11/01/20  Yes O'Neal, Cassie Freer, MD  furosemide (LASIX) 20 MG tablet TAKE 1 TABLET BY MOUTH EVERY DAY Patient taking differently: Take 20 mg by mouth daily. 09/06/20  Yes O'Neal, Cassie Freer, MD   hydrALAZINE (APRESOLINE) 25 MG tablet Take 1 tablet (25 mg total) by mouth 3 (three) times daily. 11/01/20 01/30/21 Yes O'Neal, Cassie Freer, MD  isosorbide mononitrate (IMDUR) 30 MG 24 hr tablet Take 1 tablet (30 mg total) by mouth daily. 11/01/20  Yes O'Neal, Cassie Freer, MD  JARDIANCE 10 MG TABS tablet TAKE 1 TABLET BY MOUTH EVERY DAY WITH BREAKFAST Patient taking differently: Take 10 mg by mouth daily. 04/09/20  Yes O'Neal, Cassie Freer, MD  nitroGLYCERIN (NITROSTAT) 0.4 MG SL tablet Place 1 tablet (0.4 mg total) under the tongue every 5 (five) minutes x 3 doses as needed for chest pain. 10/14/20  Yes Barrett, Evelene Croon, PA-C  simvastatin (ZOCOR) 20 MG tablet Take 1 tablet (20 mg total) by mouth every evening. 11/01/20  Yes O'Neal, Cassie Freer, MD  spironolactone (ALDACTONE) 25 MG tablet Take 25 mg by mouth daily.   Yes [provider]     Family History  Problem Relation Age of Onset   Diabetes Mother     Social History   Socioeconomic History   Marital status: Married    Spouse name: Not on file   Number of children: 3   Years of education: Not on file   Highest education level: Not on file  Occupational History   Not on file  Tobacco Use   Smoking status: Former    Years: 5.00    Types: Cigarettes   Smokeless tobacco: Never  Substance and Sexual Activity   Alcohol use: Yes  Comment: wine with dinner   Drug use: No   Sexual activity: Not on file  Other Topics Concern   Not on file  Social History Narrative   Not on file   Social Determinants of Health   Financial Resource Strain: Not on file  Food Insecurity: Not on file  Transportation Needs: Not on file  Physical Activity: Not on file  Stress: Not on file  Social Connections: Not on file    Review of Systems: A 12 point ROS discussed and pertinent positives are indicated in the HPI above.  All other systems are negative.  Review of Systems  Vital Signs: BP (!) 116/52 (BP Location: Left Arm)    Pulse (!) 51   Temp 97.6 F (36.4 C) (Oral)   Resp 19   Ht 5' 11"  (1.803 m)   Wt 78.3 kg   SpO2 100%   BMI 24.07 kg/m   Physical Exam Constitutional:      Appearance: Normal appearance.  HENT:     Mouth/Throat:     Mouth: Mucous membranes are moist.     Pharynx: Oropharynx is clear.  Cardiovascular:     Rate and Rhythm: Normal rate.     Heart sounds: Normal heart sounds.  Pulmonary:     Effort: Pulmonary effort is normal. No respiratory distress.     Breath sounds: Normal breath sounds.  Neurological:     General: No focal deficit present.     Mental Status: He is alert and oriented to person, place, and time.    Imaging: CT ABDOMEN PELVIS WO CONTRAST  Result Date: 11/23/2020 CLINICAL DATA:  Recent fall with abdominal pain, initial encounter EXAM: CT ABDOMEN AND PELVIS WITHOUT CONTRAST TECHNIQUE: Multidetector CT imaging of the abdomen and pelvis was performed following the standard protocol without IV contrast. COMPARISON:  06/15/2020 FINDINGS: Lower chest: Small pleural effusions are noted bilaterally. Mild atelectatic changes are seen. No focal confluent infiltrate is noted. Hepatobiliary: Gallbladder is within normal limits. Some mild nodularity to the liver is noted suggestive of cirrhotic change. No focal mass is noted. Pancreas: Pancreas is predominately fatty infiltrated. No acute abnormality is noted. Spleen: Spleen is within normal limits. Adrenals/Urinary Tract: Adrenal glands are well visualized bilaterally. Left adrenal gland is unremarkable. Right adrenal gland demonstrates a focal 18 mm lesion likely representing an adenoma but incompletely evaluated on this exam. The kidneys demonstrate bilateral renal cysts. The largest of these again lies on the left measuring up to 10 cm in greatest dimension. There simple in appearance. No renal calculi are identified. Fullness of the left ureter is noted proximally with distal tapering. No obstructive stone is noted. The bladder  is well distended. Stomach/Bowel: Scattered diverticular change of the colon is seen. Herniation of a loop of sigmoid colon into a left inguinal hernia is noted without obstructive change or incarceration. More proximal colon appears unremarkable. The appendix is not well visualized. Small bowel and stomach appear within normal limits. Vascular/Lymphatic: Aortic atherosclerosis. No enlarged abdominal or pelvic lymph nodes. Reproductive: Prostate is unremarkable. Other: Bilateral inguinal hernias are noted left greater than right. Sigmoid colon is noted within the left inguinal hernia. Fat is noted within the right inguinal hernia. Musculoskeletal: Undisplaced fracture of the left twelfth rib is noted. Additionally there are mildly displaced fractures involving the left L1, left L2 and right L3 transverse processes. Additionally there is an anterior inferior corner fracture from the L1 vertebral body which will be better evaluated on the upcoming CT of the lumbar  spine. IMPRESSION: Bilateral pleural effusions with mild atelectatic changes. Mild nodularity to the liver without focal mass likely representing mild cirrhosis. Right adrenal lesion likely representing an adenoma but incompletely evaluated on this exam. Given the patient's age no further follow-up is recommended. Diverticulosis without diverticulitis is noted. Bilateral inguinal hernias are noted with sigmoid colon herniating into the left inguinal hernia. Multiple transverse process fractures as well as a left twelfth rib fracture. Additionally anterior inferior corner fracture of L1 is noted. This will be better evaluated on upcoming lumbar spine CT. Electronically Signed   By: Inez Catalina M.D.   On: 11/23/2020 19:30   CT L-SPINE NO CHARGE  Result Date: 11/23/2020 CLINICAL DATA:  Lower back pain, recent fall none EXAM: CT LUMBAR SPINE WITHOUT CONTRAST TECHNIQUE: Multidetector CT imaging of the lumbar spine was performed without intravenous contrast  administration. Multiplanar CT image reconstructions were also generated. COMPARISON:  None. FINDINGS: Segmentation: 5 lumbar type vertebrae. Bilateral L5-S1 assimilation joints. Alignment: Straightening of the normal lumbar lordosis. Mild retrolisthesis L4 on L5. Trace retrolisthesis L5 on S1. Vertebrae: Fracture through the anterior inferior endplate of L1 (series 13, image 37), which appears acute. Osseous bridging is noted along the anterior right aspect of the vertebral bodies, likely DISH. Osseous bridging is also noted along several of the spinous processes. Congenitally short pedicles, which narrow the AP diameter of the spinal canal. Paraspinal and other soft tissues: Please see same-day CT abdomen pelvis for soft tissue results. Disc levels: T12-L1: No significant disc bulge. No spinal canal stenosis or neural foraminal narrowing. L1-L2: Moderate disc bulge. Moderate facet arthropathy. Moderate spinal canal stenosis. No neural foraminal narrowing. L2-L3: Mild disc bulge. Mild facet arthropathy. No definite spinal canal stenosis. No neural foraminal narrowing. L3-L4: Calcified disc bulge. Moderate facet arthropathy. Mild spinal canal stenosis. Mild bilateral neural foraminal narrowing. L4-L5: Mild retrolisthesis with broad-based disc bulge. Moderate facet arthropathy. Ligamentum flavum hypertrophy. Severe spinal canal stenosis. Moderate bilateral neural foraminal narrowing. L5-S1: Trace retrolisthesis with minimal disc bulge. Moderate facet arthropathy. No spinal canal stenosis. Mild bilateral neural foraminal narrowing. IMPRESSION: 1. Fracture through the anterior inferior endplate of L1, which appears acute or subacute. 2. Osseous bridging along the anterior aspect of the vertebral bodies, most likely diffuse idiopathic skeletal hyperostosis, with additional bridging along the spinous processes. 3. L4-L5 severe spinal canal stenosis and moderate bilateral neural foraminal narrowing. 4. L3-L4 mild spinal  canal stenosis and mild bilateral neural foraminal narrowing. 5. For soft tissue findings, please see same-day CT abdomen pelvis. Electronically Signed   By: Merilyn Baba M.D.   On: 11/23/2020 19:36   DG Chest Port 1 View  Result Date: 11/23/2020 CLINICAL DATA:  Weakness, low back pain, recent fall EXAM: PORTABLE CHEST 1 VIEW COMPARISON:  10/13/2020 FINDINGS: Cardiomegaly without edema or CHF pattern. No pneumothorax. Interval development of a small left effusion. Chronic bibasilar interstitial changes and atelectasis. Trachea midline. Aorta atherosclerotic. Bones are osteopenic and degenerative changes of the spine. IMPRESSION: Stable cardiomegaly and basilar interstitial lung disease. Small left pleural effusion blunting the left costophrenic angle. Aortic Atherosclerosis (ICD10-I70.0). Electronically Signed   By: Jerilynn Mages.  Shick M.D.   On: 11/23/2020 15:57   ECHOCARDIOGRAM COMPLETE  Result Date: 11/13/2020    ECHOCARDIOGRAM REPORT   Patient Name:   Stanley Moore Date of Exam: 11/13/2020 Medical Rec #:  [id number]            Height:       71.0 in Accession #:    [id number]  Weight:       181.6 lb Date of Birth:  1931-08-31           BSA:          2.024 m Patient Age:    49 years             BP:           142/66 mmHg Patient Gender: M                    HR:           55 bpm. Exam Location:  [place] Procedure: 2D Echo, 3D Echo, Cardiac Doppler and Color Doppler Indications:    I50.22 CHF  History:        Patient has prior history of Echocardiogram examinations, most                 recent 12/15/2019. CHF, CAD and Previous Myocardial Infarction,                 Arrythmias:Atrial Fibrillation and LBBB,                 Signs/Symptoms:Shortness of Breath, Chest Pain and Fatigue; Risk                 Factors:Hypertension, Diabetes, Dyslipidemia and Former Smoker.  Sonographer:    Deliah Boston RDCS Referring Phys: Cassie Freer O'NEAL IMPRESSIONS  1. Compared to 12/15/19 MR is now severe.  2.  Global hypokinesis with akinesis of the basal inferolateral wall; overall severe LV dysfunction.  3. Left ventricular ejection fraction, by estimation, is 25 to 30%. The left ventricle has severely decreased function. The left ventricle demonstrates regional wall motion abnormalities (see scoring diagram/findings for description). The left ventricular internal cavity size was moderately dilated. Left ventricular diastolic parameters are consistent with Grade II diastolic dysfunction (pseudonormalization). Elevated left atrial pressure.  4. Right ventricular systolic function is mildly reduced. The right ventricular size is mildly enlarged. There is severely elevated pulmonary artery systolic pressure.  5. Left atrial size was severely dilated.  6. Right atrial size was severely dilated.  7. The mitral valve is degenerative. Severe mitral valve regurgitation. No evidence of mitral stenosis.  8. The aortic valve is tricuspid. Aortic valve regurgitation is trivial. Mild aortic valve sclerosis is present, with no evidence of aortic valve stenosis.  9. The inferior vena cava is dilated in size with <50% respiratory variability, suggesting right atrial pressure of 15 mmHg. Comparison(s): Prior EF 25-30%. FINDINGS  Left Ventricle: Left ventricular ejection fraction, by estimation, is 25 to 30%. The left ventricle has severely decreased function. The left ventricle demonstrates regional wall motion abnormalities. The left ventricular internal cavity size was moderately dilated. There is no left ventricular hypertrophy. Left ventricular diastolic parameters are consistent with Grade II diastolic dysfunction (pseudonormalization). Elevated left atrial pressure. Right Ventricle: The right ventricular size is mildly enlarged. Right ventricular systolic function is mildly reduced. There is severely elevated pulmonary artery systolic pressure. The tricuspid regurgitant velocity is 3.49 m/s, and with an assumed right atrial  pressure of 15 mmHg, the estimated right ventricular systolic pressure is 29.5 mmHg. Left Atrium: Left atrial size was severely dilated. Right Atrium: Right atrial size was severely dilated. Pericardium: There is no evidence of pericardial effusion. Mitral Valve: The mitral valve is degenerative in appearance. Mild mitral annular calcification. Severe mitral valve regurgitation. No evidence of mitral valve stenosis. Tricuspid Valve: The tricuspid valve is normal in structure.  Tricuspid valve regurgitation is mild . No evidence of tricuspid stenosis. Aortic Valve: The aortic valve is tricuspid. Aortic valve regurgitation is trivial. Mild aortic valve sclerosis is present, with no evidence of aortic valve stenosis. Pulmonic Valve: The pulmonic valve was normal in structure. Pulmonic valve regurgitation is trivial. No evidence of pulmonic stenosis. Aorta: The aortic root is normal in size and structure. Venous: The inferior vena cava is dilated in size with less than 50% respiratory variability, suggesting right atrial pressure of 15 mmHg. IAS/Shunts: No atrial level shunt detected by color flow Doppler. Additional Comments: Global hypokinesis with akinesis of the basal inferolateral wall; overall severe LV dysfunction. Compared to 12/15/19 MR is now severe.  LEFT VENTRICLE PLAX 2D LVIDd:         5.80 cm   Diastology LVIDs:         5.45 cm   LV e' medial:    3.92 cm/s LV PW:         1.00 cm   LV E/e' medial:  23.9 LV IVS:        0.80 cm   LV e' lateral:   4.68 cm/s LVOT diam:     2.20 cm   LV E/e' lateral: 20.0 LV SV:         53 LV SV Index:   26 LVOT Area:     3.80 cm                           3D Volume EF:                          3D EF:        15 %                          LV EDV:       163 ml                          LV ESV:       139 ml                          LV SV:        24 ml RIGHT VENTRICLE RV S prime:     9.68 cm/s TAPSE (M-mode): 1.7 cm LEFT ATRIUM              Index        RIGHT ATRIUM           Index LA  diam:        5.00 cm  2.47 cm/m   RA Area:     31.40 cm LA Vol (A2C):   104.0 ml 51.39 ml/m  RA Volume:   123.00 ml 60.78 ml/m LA Vol (A4C):   82.3 ml  40.67 ml/m LA Biplane Vol: 94.9 ml  46.89 ml/m  AORTIC VALVE LVOT Vmax:   60.80 cm/s LVOT Vmean:  37.233 cm/s LVOT VTI:    0.138 m  AORTA Ao Root diam: 3.00 cm Ao Asc diam:  3.30 cm MITRAL VALVE                 TRICUSPID VALVE MV Area (PHT): cm           TR Peak grad:   48.7 mmHg MV Decel Time: 224 msec  TR Vmax:        349.00 cm/s MR Peak grad:   136.0 mmHg MR Mean grad:   83.0 mmHg    SHUNTS MR Vmax:        583.00 cm/s  Systemic VTI:  0.14 m MR Vmean:       425.0 cm/s   Systemic Diam: 2.20 cm MR PISA:        2.26 cm MR PISA Radius: 0.60 cm MV E velocity: 93.80 cm/s MV A velocity: 42.45 cm/s MV E/A ratio:  2.21 Kirk Ruths MD Electronically signed by Kirk Ruths MD Signature Date/Time: 11/13/2020/11:35:30 AM    Final     Labs:  CBC: Recent Labs    11/24/20 0301 11/25/20 0420 11/26/20 0246 11/27/20 0230  WBC 4.2 3.3* 3.2* 2.9*  HGB 8.6* 8.8* 8.5* 8.2*  HCT 26.4* 27.2* 26.3* 25.0*  PLT 88* 84* 81* 82*    COAGS: Recent Labs    10/13/20 1623 10/14/20 0140 11/23/20 1619 11/25/20 0420  INR 1.2  --  1.5* 1.4*  APTT 31 37*  --   --     BMP: Recent Labs    12/26/19 1056 01/17/20 1000 10/13/20 1108 11/24/20 0301 11/25/20 0420 11/26/20 0246 11/27/20 0230  NA 140 139   < > 136 137 138 136  K 4.6 4.9   < > 4.8 4.2 4.3 4.2  CL 105 103   < > 112* 111 113* 112*  CO2 24 22   < > 15* 15* 17* 18*  GLUCOSE 138* 144*   < > 162* 129* 127* 139*  BUN 30* 34*   < > 94* 91* 84* 80*  CALCIUM 8.7 9.1   < > 8.2* 8.1* 8.2* 8.2*  CREATININE 1.53* 1.81*   < > 2.66* 2.52* 2.55* 2.33*  GFRNONAA 41* 33*   < > 23* 24* 24* 26*  GFRAA 47* 38*  --   --   --   --   --    < > = values in this interval not displayed.    LIVER FUNCTION TESTS: Recent Labs    11/23/20 1619 11/24/20 0301 11/27/20 0230  BILITOT 0.7 1.6* 1.1  AST 13* 38 21   ALT 18 17 16   ALKPHOS 48 49 45  PROT 6.1* 6.0* 5.7*  ALBUMIN 2.9* 3.0* 2.8*    TUMOR MARKERS: No results for input(s): AFPTM, CEA, CA199, CHROMGRNA in the last 8760 hours.  Assessment and Plan: Pancytopenia For image guided bone marrow biopsy Labs reviewed. Risks and benefits of bone marrow was discussed with the patient and/or patient's family including, but not limited to bleeding, infection, damage to adjacent structures or low yield requiring additional tests.  All of the questions were answered and there is agreement to proceed.  Consent signed and in chart.   Thank you for this interesting consult.  I greatly enjoyed meeting Stanley Moore and look forward to participating in their care.  A copy of this report was sent to the requesting provider on this date.  Electronically Signed: Ascencion Dike, PA-C 11/27/2020, 1:37 PM   I spent a total of 20 minutes in face to face in clinical consultation, greater than 50% of which was counseling/coordinating care for bone marrow biopsy

## 2020-11-27 NOTE — NC FL2 (Signed)
[place] LEVEL OF CARE SCREENING TOOL     IDENTIFICATION  Patient Name: Stanley Moore Birthdate: 1931/05/01 Sex: male Admission Date (Current Location): 11/23/2020  [facility] and [place] Number:  Herbalist and Address:  [facility], [place] [address], [place], [place] [address]      Provider Number: [id number]  Attending Physician Name and Address:  Zenia Resides, MD  Relative Name and Phone Number:  Harriett Rush Daughter     [phone]    Current Level of Care: Hospital Recommended Level of Care: [place] Prior Approval Number:    Date Approved/Denied:   PASRR Number: [id number]  Discharge Plan: SNF    Current Diagnoses: Patient Active Problem List   Diagnosis Date Noted   Fall    Closed fracture of transverse process of lumbar vertebra (HCC)    Macrocytic anemia    Chronic HFrEF (heart failure with reduced ejection fraction) (HCC)    PAF (paroxysmal atrial fibrillation) (HCC)    AKI (acute kidney injury) ([place]) 11/23/2020   NSTEMI (non-ST elevated myocardial infarction) ([place]) 10/13/2020   Persistent atrial fibrillation ([place])    Unspecified essential hypertension 04/06/2012   Pure hypercholesterolemia 04/06/2012   Type II or unspecified type diabetes mellitus without mention of complication, not stated as uncontrolled 04/06/2012    Orientation RESPIRATION BLADDER Height & Weight     Self, Time, Situation, Place  Normal Continent Weight: 172 lb 9.6 oz (78.3 kg) Height:  5\' 11"  (180.3 cm)  BEHAVIORAL SYMPTOMS/MOOD NEUROLOGICAL BOWEL NUTRITION STATUS      Continent Diet (see d/c summary)  AMBULATORY STATUS COMMUNICATION OF NEEDS Skin   Limited Assist Verbally Surgical wounds                       Personal Care Assistance Level of Assistance  Bathing, Feeding, Dressing Bathing Assistance: Limited assistance Feeding assistance: Independent Dressing Assistance: Limited  assistance     Functional Limitations Info  Sight, Hearing, Speech Sight Info: Adequate Hearing Info: Adequate Speech Info: Adequate    SPECIAL CARE FACTORS FREQUENCY  PT (By licensed PT), OT (By licensed OT)     PT Frequency: 5x/ week OT Frequency: 5x/ week            Contractures Contractures Info: Not present    Additional Factors Info  Allergies, Code Status Code Status Info: DNR Allergies Info: NKA           Current Medications (11/27/2020):  This is the current hospital active medication list Current Facility-Administered Medications  Medication Dose Route Frequency Provider Last Rate Last Admin   acetaminophen (TYLENOL) tablet 650 mg  650 mg Oral Q6H Welborn, Ryan, DO   650 mg at 11/27/20 0518   Or   acetaminophen (TYLENOL) suppository 650 mg  650 mg Rectal Q6H Welborn, Ryan, DO       amiodarone (PACERONE) tablet 100 mg  100 mg Oral Daily Gerrit Heck, MD   100 mg at 11/27/20 1042   clopidogrel (PLAVIX) tablet 75 mg  75 mg Oral Daily Espinoza, Alejandra, DO   75 mg at 11/27/20 1042   enoxaparin (LOVENOX) injection 30 mg  30 mg Subcutaneous Q24H Welborn, Ryan, DO   30 mg at 11/26/20 1219   liver oil-zinc oxide (DESITIN) 40 % ointment   Topical Daily PRN Zenia Resides, MD       oxyCODONE (Oxy IR/ROXICODONE) immediate release tablet 5 mg  5 mg Oral Q4H  PRN Wells Guiles, DO   5 mg at 11/27/20 1042   polyethylene glycol (MIRALAX / GLYCOLAX) packet 17 g  17 g Oral BID Gerrit Heck, MD   17 g at 11/27/20 1041   senna (SENOKOT) tablet 8.6 mg  1 tablet Oral BID Gerrit Heck, MD   8.6 mg at 11/27/20 1042   simvastatin (ZOCOR) tablet 20 mg  20 mg Oral QPM Gerrit Heck, MD   20 mg at 11/26/20 1713     Discharge Medications: Please see discharge summary for a list of discharge medications.  Relevant Imaging Results:  Relevant Lab Results:   Additional Information SSN: [id number];  COVID x 2; 5\' 11"  172lbs  Elyas Villamor F Milderd Manocchio, LCSWA

## 2020-11-27 NOTE — Progress Notes (Signed)
FPTS Brief Progress Note  S: Stanley Moore was seen sleeping comfortably.    O: BP (!) 106/44 (BP Location: Left Arm)   Pulse (!) 51   Temp 98 F (36.7 C) (Oral)   Resp 18   Ht 5\' 11"  (1.803 m)   Wt 86.6 kg   SpO2 99%   BMI 26.63 kg/m     A/P: Weakness and low back pain s/p multiple falls  multiple transverse process fractures  left 12th rib fracture Patient was resting comfortably, no signs of pain. PT/OT Continue acetaminophen 650 mg every 6 hours as needed Continue oxycodone 5 mg every 4 hours as needed  Persistent A. fib status post cardioversion in 2021 Resting heart rate in the 40s to 50s while sleeping.  Continue amiodarone 100 mg daily Continue telemetry monitor Hold Eliquis Follow-up a.m. CBC  - Orders reviewed. Labs for AM ordered, which was adjusted as needed.   Remainder of plan per day team/daily progress note.  Merrily Brittle, DO 11/27/2020, 2:07 AM PGY-1, [facility] Night Resident  Please page [phone] with questions.

## 2020-11-27 NOTE — Progress Notes (Signed)
Physical Therapy Treatment Patient Details Name: Stanley Moore MRN: [id number] DOB: 1931/12/10 Today's Date: 11/27/2020   History of Present Illness 87 y.o. male presents to [facility] on 11/23/2020 after multiple falls. PT found to have L 12th rib fx as well as L1-3 TP fxs and L1 corner fx of vertebral body. Hgb 7.4 on admission. PMH includes HTN, HLD, T2DM, CKD 3b, persistent A. 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    PT Comments    Pt progressing steadily towards his physical therapy goals; he is motivated to participate. Pt ambulating 80 feet with a walker and min assist; chair follow utilized. Able to then perform chair level exercises for BLE strengthening. Pt continues with decreased BLE coordination, weakness, balance deficits, decreased gait speed, abnormal posture, and decreased endurance. Continue to recommend SNF for ongoing Physical Therapy.      Recommendations for follow up therapy are one component of a multi-disciplinary discharge planning process, led by the attending physician.  Recommendations may be updated based on patient status, additional functional criteria and insurance authorization.  Follow Up Recommendations  Skilled nursing-short term rehab (<3 hours/day)     Assistance Recommended at Discharge Intermittent Supervision/Assistance  Equipment Recommendations  Wheelchair (measurements PT)    Recommendations for Other Services       Precautions / Restrictions Precautions Precautions: Fall;Back Precaution Booklet Issued: No Required Braces or Orthoses: Spinal Brace Spinal Brace: Thoracolumbosacral orthotic;Applied in sitting position Restrictions Weight Bearing Restrictions: No     Mobility  Bed Mobility Overal bed mobility:  (PT OOB)             General bed mobility comments: OOB in recliner    Transfers Overall transfer level: Needs assistance Equipment used: Rolling walker (2  wheels) Transfers: Sit to/from Stand Sit to Stand: Min guard           General transfer comment: Pt requires moderat to max cues for saftey and on first attempt B knee buckling but able to use RW with UE without LOB    Ambulation/Gait Ambulation/Gait assistance: Min assist Gait Distance (Feet): 80 Feet Assistive device: Rolling walker (2 wheels) Gait Pattern/deviations: Step-through pattern;Decreased stride length;Knees buckling;Trunk flexed Gait velocity: decreased Gait velocity interpretation: <1.8 ft/sec, indicate of risk for recurrent falls   General Gait Details: Cues for upright posture, noted mild intermittent knee buckling (L> R), pt able to verbalize when fatigued. chair follow utilized   Marine scientist Rankin (Stroke Patients Only)       Balance Overall balance assessment: Needs assistance;History of Falls Sitting-balance support: Feet supported;Bilateral upper extremity supported Sitting balance-Leahy Scale: Fair     Standing balance support: Bilateral upper extremity supported Standing balance-Leahy Scale: Poor                              Cognition Arousal/Alertness: Awake/alert Behavior During Therapy: WFL for tasks assessed/performed Overall Cognitive Status: Within Functional Limits for tasks assessed Area of Impairment: Memory;Following commands;Safety/judgement                     Memory: Decreased recall of precautions Following Commands: Follows one step commands consistently Safety/Judgement: Decreased awareness of safety     General Comments: STM deficits noted        Exercises General Exercises - Lower Extremity Long Arc Quad: Both;20 reps;Seated Hip  Flexion/Marching: Both;20 reps;Seated Heel Raises: Both;20 reps;Seated    General Comments        Pertinent Vitals/Pain Pain Assessment: Faces Faces Pain Scale: Hurts a little bit Pain Location: bottom Pain  Descriptors / Indicators: Grimacing Pain Intervention(s): Monitored during session;Repositioned    Home Living                          Prior Function            PT Goals (current goals can now be found in the care plan section) Acute Rehab PT Goals Patient Stated Goal: to return to independent mobility and stop falling Potential to Achieve Goals: Fair Progress towards PT goals: Progressing toward goals    Frequency    Min 3X/week      PT Plan Current plan remains appropriate    Co-evaluation              AM-PAC PT "6 Clicks" Mobility   Outcome Measure  Help needed turning from your back to your side while in a flat bed without using bedrails?: A Little Help needed moving from lying on your back to sitting on the side of a flat bed without using bedrails?: A Little Help needed moving to and from a bed to a chair (including a wheelchair)?: A Little Help needed standing up from a chair using your arms (e.g., wheelchair or bedside chair)?: A Little Help needed to walk in hospital room?: A Little Help needed climbing 3-5 steps with a railing? : Total 6 Click Score: 16    End of Session Equipment Utilized During Treatment: Gait belt;Back brace Activity Tolerance: Patient tolerated treatment well Patient left: in chair;with call bell/phone within reach;with chair alarm set Nurse Communication: Mobility status PT Visit Diagnosis: Other abnormalities of gait and mobility (R26.89);Muscle weakness (generalized) (M62.81)     Time: 8101-7510 PT Time Calculation (min) (ACUTE ONLY): 18 min  Charges:  $Gait Training: 8-22 mins                     [name], PT, DPT Acute Rehabilitation Services Pager [phone] Office [phone]    [name] 11/27/2020, 4:48 PM

## 2020-11-27 NOTE — Progress Notes (Signed)
Family Medicine Teaching Service Daily Progress Note Intern Pager: [phone]  Patient name: Stanley Moore Medical record number: [id number] Date of birth: 23-Apr-1931 Age: 87 y.o. Gender: male  Primary Care Provider: Heywood Bene, PA-C Consultants: Cardiology, Oncology Code Status: DNR/DNI  Pt Overview and Major Events to Date:  11/18-admitted  Assessment and Plan:  AARIB PULIDO is a 87 y.o. male presenting with weakness and low back pain s/p fall, found to have multiple transverse process fractures as well as left 12th rib fracture. PMH is significant for hypertension, hyperlipidemia, T2DM, CKD 3b, persistent A. 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   Weakness and low back pain s/p multiple falls  multiple transverse process fractures  left 12th rib fracture Patient pain well controlled this morning, with good mobility getting around room with wheeled walker.   Will reach out to Rehab Admission Coordinator Lind Covert, Lauren about family decision for rehab vs snf.  [phone]   CHF exacerbation with HFrEF 25-30% ejection fraction  history of left bundle branch block  moderate mitral regurge UOP 1.975 yesterday, lasix held, patient volume status is stable from yesterday, with pitting edema to level of shin. Dry weight estimated to be 180 llb or 81.6 kg. Weight today 78.3 kg from 86.6 kg yesterday. Weight's likely measured inappropriately. -Continue to monitor -Cardiology following, appreciate recs -Hold Lasix  AKI on CKD stage IIIb Cr down at 2.33 today from 2.55 yesterday. Lasix held patient AKI continues to improve.  -Hold lasix  Persistent A. fib status post cardioversion in 2021 -Continue Amiodarone 100 mg daily -Hold Eliquis -Daily CBC  Thrombocytopenia  Macrocytic anemia  Leukopenia  Hgb 8.2 (8.5, 8.8, 8.6) and Plt 82 (81, 84, 88) WBC 2.9 (3.2, 3.3, 4.2) Pharmacy looked through meds and found no cause  for macrocytic anemia. Patient continues to have have drop in all 3 cell lines. Homocysteine level shows no signs of folate or B12 deficiency, LDH level shows no signs of hemolysis. Oncology recommending a bone marrow biopsy.  -Follow up  Methylmalonic acid and homocystine: Homocysteine wnl  SPEP and serum free light chains (kappa and lambda):  LDH and reticulocyte panel: LDH wnl ;  Retic elv 18.8 (2.3-15.9) Blood Smear: Macrocytic anemia with burr cells, thrombocytopenia with occasional giant platelet, leukocytes unremarkable -Oncology following appreciate recs -Reach out to IR for bone marrow biopsy -Hold Eliquis  DM2 CBG stable range 127-139  -Continue to monitor CBG  Constipation Continue Miralax 34 g daily Continue Senna  FEN/GI: Carb modified/ heart healthy PPx: Lovenox Dispo:SNF in 2-3 days.    Subjective:  Patient sitting comfortably in chair, no complaints of pain.   Objective: Temp:  [97.6 F (36.4 C)-99.3 F (37.4 C)] 98.1 F (36.7 C) (11/22 0421) Pulse Rate:  [51-55] 52 (11/22 0421) Resp:  [14-20] 18 (11/21 1942) BP: (100-159)/(44-135) 159/135 (11/22 0421) SpO2:  [99 %-100 %] 99 % (11/22 0421) Weight:  [86.6 kg] 86.6 kg (11/21 0649) Physical Exam: General: Elderly, frail, well appearing, white male, NASD Cardiovascular: distant heart sounds, peripheral pulses intact and steady, good warm perfusion of extremities Respiratory: CTABL Abdomen: Soft, NTTP, non-distended Extremities: 3+ pitting edema to level of mid shins bilaterally. Moving all extremities independently  Laboratory: Recent Labs  Lab 11/25/20 0420 11/26/20 0246 11/27/20 0230  WBC 3.3* 3.2* 2.9*  HGB 8.8* 8.5* 8.2*  HCT 27.2* 26.3* 25.0*  PLT 84* 81* 82*   Recent Labs  Lab 11/23/20 1619 11/23/20 1631 11/24/20 0301 11/25/20 0420 11/26/20  0246 11/27/20 0230  NA 136   < > 136 137 138 136  K 4.7   < > 4.8 4.2 4.3 4.2  CL 115*   < > 112* 111 113* 112*  CO2 15*  --  15* 15* 17* 18*  BUN  99*   < > 94* 91* 84* 80*  CREATININE 2.68*   < > 2.66* 2.52* 2.55* 2.33*  CALCIUM 8.1*  --  8.2* 8.1* 8.2* 8.2*  PROT 6.1*  --  6.0*  --   --   --   BILITOT 0.7  --  1.6*  --   --   --   ALKPHOS 48  --  49  --   --   --   ALT 18  --  17  --   --   --   AST 13*  --  38  --   --   --   GLUCOSE 180*   < > 162* 129* 127* 139*   < > = values in this interval not displayed.      Imaging/Diagnostic Tests:   Holley Bouche, MD 11/27/2020, 5:53 AM PGY-1, [place] Intern pager: [phone], text pages welcome

## 2020-11-28 ENCOUNTER — Inpatient Hospital Stay (HOSPITAL_COMMUNITY): Payer: Medicare HMO

## 2020-11-28 DIAGNOSIS — I340 Nonrheumatic mitral (valve) insufficiency: Secondary | ICD-10-CM | POA: Diagnosis not present

## 2020-11-28 DIAGNOSIS — I480 Paroxysmal atrial fibrillation: Secondary | ICD-10-CM | POA: Diagnosis not present

## 2020-11-28 DIAGNOSIS — I5022 Chronic systolic (congestive) heart failure: Secondary | ICD-10-CM | POA: Diagnosis not present

## 2020-11-28 DIAGNOSIS — N179 Acute kidney failure, unspecified: Secondary | ICD-10-CM | POA: Diagnosis not present

## 2020-11-28 DIAGNOSIS — W19XXXD Unspecified fall, subsequent encounter: Secondary | ICD-10-CM | POA: Diagnosis not present

## 2020-11-28 DIAGNOSIS — S32009A Unspecified fracture of unspecified lumbar vertebra, initial encounter for closed fracture: Secondary | ICD-10-CM | POA: Diagnosis not present

## 2020-11-28 LAB — CBC
HCT: 26.3 % — ABNORMAL LOW (ref 39.0–52.0)
Hemoglobin: 8.3 g/dL — ABNORMAL LOW (ref 13.0–17.0)
MCH: 36.9 pg — ABNORMAL HIGH (ref 26.0–34.0)
MCHC: 31.6 g/dL (ref 30.0–36.0)
MCV: 116.9 fL — ABNORMAL HIGH (ref 80.0–100.0)
Platelets: 85 10*3/uL — ABNORMAL LOW (ref 150–400)
RBC: 2.25 MIL/uL — ABNORMAL LOW (ref 4.22–5.81)
RDW: 20.7 % — ABNORMAL HIGH (ref 11.5–15.5)
WBC: 3.1 10*3/uL — ABNORMAL LOW (ref 4.0–10.5)
nRBC: 0 % (ref 0.0–0.2)

## 2020-11-28 LAB — PROTEIN ELECTROPHORESIS, SERUM
A/G Ratio: 1.1 (ref 0.7–1.7)
Albumin ELP: 3.2 g/dL (ref 2.9–4.4)
Alpha-1-Globulin: 0.3 g/dL (ref 0.0–0.4)
Alpha-2-Globulin: 0.7 g/dL (ref 0.4–1.0)
Beta Globulin: 0.9 g/dL (ref 0.7–1.3)
Gamma Globulin: 1 g/dL (ref 0.4–1.8)
Globulin, Total: 2.9 g/dL (ref 2.2–3.9)
Total Protein ELP: 6.1 g/dL (ref 6.0–8.5)

## 2020-11-28 LAB — BASIC METABOLIC PANEL
Anion gap: 10 (ref 5–15)
BUN: 78 mg/dL — ABNORMAL HIGH (ref 8–23)
CO2: 17 mmol/L — ABNORMAL LOW (ref 22–32)
Calcium: 8.3 mg/dL — ABNORMAL LOW (ref 8.9–10.3)
Chloride: 109 mmol/L (ref 98–111)
Creatinine, Ser: 2.36 mg/dL — ABNORMAL HIGH (ref 0.61–1.24)
GFR, Estimated: 26 mL/min — ABNORMAL LOW (ref >60)
Glucose, Bld: 146 mg/dL — ABNORMAL HIGH (ref 70–99)
Potassium: 4.7 mmol/L (ref 3.5–5.1)
Sodium: 136 mmol/L (ref 135–145)

## 2020-11-28 LAB — GLUCOSE, CAPILLARY: Glucose-Capillary: 160 mg/dL — ABNORMAL HIGH (ref 70–99)

## 2020-11-28 MED ORDER — MIDAZOLAM HCL 2 MG/2ML IJ SOLN
INTRAMUSCULAR | Status: AC | PRN
  Administered 2020-11-28: 1 mg via INTRAVENOUS
  Administered 2020-11-28: .5 mg via INTRAVENOUS

## 2020-11-28 MED ORDER — FENTANYL CITRATE (PF) 100 MCG/2ML IJ SOLN
INTRAMUSCULAR | Status: AC
  Filled 2020-11-28: qty 4

## 2020-11-28 MED ORDER — MIDAZOLAM HCL 2 MG/2ML IJ SOLN
INTRAMUSCULAR | Status: AC
  Filled 2020-11-28: qty 4

## 2020-11-28 MED ORDER — FENTANYL CITRATE (PF) 100 MCG/2ML IJ SOLN
INTRAMUSCULAR | Status: AC | PRN
  Administered 2020-11-28: 50 ug via INTRAVENOUS
  Administered 2020-11-28: 25 ug via INTRAVENOUS

## 2020-11-28 MED ORDER — ENOXAPARIN SODIUM 30 MG/0.3ML IJ SOSY: 30.0000 mg | PREFILLED_SYRINGE | INTRAMUSCULAR | Status: DC

## 2020-11-28 MED ORDER — CLOPIDOGREL BISULFATE 75 MG PO TABS: 75.0000 mg | ORAL_TABLET | Freq: Every day | ORAL | Status: DC

## 2020-11-28 NOTE — Procedures (Signed)
Pre-procedure Diagnosis: Pancytopenia Post-procedure Diagnosis: Same  Technically successful CT guided bone marrow aspiration and biopsy of left iliac crest.   Complications: None Immediate  EBL: None  Signed: Sandi Mariscal Pager: [phone] 11/28/2020, 9:16 AM

## 2020-11-28 NOTE — TOC Progression Note (Addendum)
Transition of Care [facility]) - Initial/Assessment Note    Patient Details  Name: Stanley Moore MRN: [id number] Date of Birth: 11-24-1931  [facility] of [facility]) CM/SW Contact:    Milinda Antis, LCSWA Phone Number: 11/28/2020, 10:09 AM  Clinical Narrative:                 CSW contacted the patient's daughter and presented bed offers.  [facility] was chosen.    CSW contacted Anselmo Pickler with admissions at [facility] in an attempt to secure the bed offer.  CSW is awaiting a response.    10:31-  Facility accepted and will begin insurance auth.   Patient Goals and CMS Choice        Expected Discharge Plan and Services                                                Prior Living Arrangements/Services                       Activities of Daily Living Home Assistive Devices/Equipment: Cane (specify quad or straight) ADL Screening (condition at time of admission) Patient's cognitive ability adequate to safely complete daily activities?: Yes Is the patient deaf or have difficulty hearing?: No Does the patient have difficulty seeing, even when wearing glasses/contacts?: No Does the patient have difficulty concentrating, remembering, or making decisions?: No Patient able to express need for assistance with ADLs?: Yes Does the patient have difficulty dressing or bathing?: Yes Independently performs ADLs?: No Communication: Independent Dressing (OT): Needs assistance Is this a change from baseline?: Change from baseline, expected to last >3 days Grooming: Independent Feeding: Independent Bathing: Needs assistance Is this a change from baseline?: Change from baseline, expected to last >3 days Toileting: Needs assistance Is this a change from baseline?: Change from baseline, expected to last >3days In/Out Bed: Needs assistance Is this a change from baseline?: Change from baseline, expected to last >3 days Walks in Home: Needs  assistance Is this a change from baseline?: Change from baseline, expected to last >3 days Does the patient have difficulty walking or climbing stairs?: Yes Weakness of Legs: Both Weakness of Arms/Hands: Both  Permission Sought/Granted                  Emotional Assessment              Admission diagnosis:  Fall [W19.XXXA] AKI (acute kidney injury) ([place]) [N17.9] Closed fracture of transverse process of lumbar vertebra, initial encounter ([place]) [S32.009A] Patient Active Problem List   Diagnosis Date Noted   Pancytopenia ([place])    Fall    Closed fracture of transverse process of lumbar vertebra ([place])    Macrocytic anemia    Chronic HFrEF (heart failure with reduced ejection fraction) (H[facility])    PAF (paroxysmal atrial fibrillation) ([place])    AKI (acute kidney injury) ([place]) 11/23/2020   NSTEMI (non-ST elevated myocardial infarction) ([place]) 10/13/2020   Persistent atrial fibrillation ([place])    Unspecified essential hypertension 04/06/2012   Pure hypercholesterolemia 04/06/2012   Type II or unspecified type diabetes mellitus without mention of complication, not stated as uncontrolled 04/06/2012   PCP:  Heywood Bene, PA-C Pharmacy:   [facility] [place], [place] [address] Phone:  [phone] Fax: [phone]  [id number] - [place], [place]. AT [address] [place] [address] Phone: [phone] Fax: [phone]     Social Determinants of Health (SDOH) Interventions    Readmission Risk Interventions No flowsheet data found.

## 2020-11-28 NOTE — Progress Notes (Signed)
Paged Dr. Alfonse Spruce to notify that post void bladder scan: 7102mL. Order received to straight cath patient.

## 2020-11-28 NOTE — Progress Notes (Signed)
Progress Note  Patient Name: Stanley Moore Date of Encounter: 11/28/2020  [facility] Cardiologist: Evalina Field, MD   Subjective   Breathing is OK  No CP   Inpatient Medications    Scheduled Meds:  acetaminophen  650 mg Oral Q6H   Or   acetaminophen  650 mg Rectal Q6H   amiodarone  100 mg Oral Daily   clopidogrel  75 mg Oral Daily   enoxaparin (LOVENOX) injection  30 mg Subcutaneous Q24H   fentaNYL       midazolam       polyethylene glycol  17 g Oral BID   senna  1 tablet Oral BID   simvastatin  20 mg Oral QPM   Continuous Infusions:  PRN Meds: liver oil-zinc oxide, oxyCODONE   Vital Signs    Vitals:   11/27/20 1041 11/27/20 1955 11/28/20 0455 11/28/20 0600  BP: (!) 116/52 (!) 105/36 (!) 119/50   Pulse: (!) 51 (!) 49 62   Resp: 19     Temp: 97.6 F (36.4 C) 97.9 F (36.6 C) (!) 97.5 F (36.4 C)   TempSrc: Oral Oral Oral   SpO2: 100% 100% 98%   Weight:    86.2 kg  Height:        Intake/Output Summary (Last 24 hours) at 11/28/2020 0833 Last data filed at 11/28/2020 0700 Gross per 24 hour  Intake 480 ml  Output 1750 ml  Net -1270 ml   Last 3 Weights 11/28/2020 11/27/2020 11/26/2020  Weight (lbs) 190 lb 0.6 oz 172 lb 9.6 oz 190 lb 14.7 oz  Weight (kg) 86.2 kg 78.291 kg 86.6 kg      Telemetry      Sinus bradycardia   40s to 60s     - Personally Reviewed  ECG    No new  - Personally Reviewed  Physical Exam   GEN: No acute distress.  Sitting in chair with brace on Neck: JVP is increased Cardiac: RRR, no murmurs Respiratory: Clear to auscultation bilaterally. GI: Soft, nontender, non-distended  MS: Tr edema; No deformity. Neuro:  Nonfocal  Psych: Normal affect   Labs    High Sensitivity Troponin:   Recent Labs  Lab 11/23/20 1619 11/23/20 1944  TROPONINIHS 65* 64*     Chemistry Recent Labs  Lab 11/23/20 1619 11/23/20 1631 11/24/20 0301 11/25/20 0420 11/26/20 0246 11/27/20 0230 11/28/20 0255  NA 136   < > 136    < > 138 136 136  K 4.7   < > 4.8   < > 4.3 4.2 4.7  CL 115*   < > 112*   < > 113* 112* 109  CO2 15*  --  15*   < > 17* 18* 17*  GLUCOSE 180*   < > 162*   < > 127* 139* 146*  BUN 99*   < > 94*   < > 84* 80* 78*  CREATININE 2.68*   < > 2.66*   < > 2.55* 2.33* 2.36*  CALCIUM 8.1*  --  8.2*   < > 8.2* 8.2* 8.3*  MG 2.3  --   --   --  2.3  --   --   PROT 6.1*  --  6.0*  --   --  5.7*  --   ALBUMIN 2.9*  --  3.0*  --   --  2.8*  --   AST 13*  --  38  --   --  21  --  ALT 18  --  17  --   --  16  --   ALKPHOS 48  --  49  --   --  45  --   BILITOT 0.7  --  1.6*  --   --  1.1  --   GFRNONAA 22*  --  23*   < > 24* 26* 26*  ANIONGAP 6  --  9   < > 8 6 10    < > = values in this interval not displayed.    Lipids No results for input(s): CHOL, TRIG, HDL, LABVLDL, LDLCALC, CHOLHDL in the last 168 hours.  Hematology Recent Labs  Lab 11/26/20 0246 11/27/20 0230 11/28/20 0255  WBC 3.2* 2.9* 3.1*  RBC 2.28* 2.19* 2.25*  HGB 8.5* 8.2* 8.3*  HCT 26.3* 25.0* 26.3*  MCV 115.4* 114.2* 116.9*  MCH 37.3* 37.4* 36.9*  MCHC 32.3 32.8 31.6  RDW 21.7* 21.1* 20.7*  PLT 81* 82* 85*   Thyroid  Recent Labs  Lab 11/24/20 0832  TSH 5.194*    BNP Recent Labs  Lab 11/24/20 0301  BNP 1,126.0*    DDimer No results for input(s): DDIMER in the last 168 hours.   Radiology    No results found.  Cardiac Studies   1. Compared to 12/15/19 MR is now severe. Global hypokinesis with akinesis of the basal inferolateral wall; overall severe LV dysfunction. 2. Left ventricular ejection fraction, by estimation, is 25 to 30%. The left ventricle has severely decreased function. The left ventricle demonstrates regional wall motion abnormalities (see scoring diagram/findings for description). The left ventricular internal cavity size was moderately dilated. Left ventricular diastolic parameters are consistent with Grade II diastolic dysfunction (pseudonormalization). Elevated left atrial pressure. 3. Right  ventricular systolic function is mildly reduced. The right ventricular size is mildly enlarged. There is severely elevated pulmonary artery systolic pressure. 4. 5. Left atrial size was severely dilated. 6. Right atrial size was severely dilated. The mitral valve is degenerative. Severe mitral valve regurgitation. No evidence of mitral stenosis. 7. The aortic valve is tricuspid. Aortic valve regurgitation is trivial. Mild aortic valve sclerosis is present, with no evidence of aortic valve stenosis. 8. The inferior vena cava is dilated in size with <50% respiratory variability, suggesting right atrial pressure of 15 mmHg. 9. Comparison(s): Prior EF 25-30%.  Patient Profile  Stanley Moore is a 87 y.o. male with a hx of CAD, LBBB, paroxysmal atrial fibrillation, diabetes mellitus, chronic systolic heart failure, severe mitral regurgitation, hypertension, hyperlipidemia and chronic kidney disease stage III/IV who is being seen 11/24/2020 for the evaluation of CHF at the request of Dr. Andria Frames.  Assessment & Plan    1  PAF   Patient still with  sinus bradycardia     Rates 40s to 50s    He is off of Bblockers.   Still on amiodarone to maintain SR  100 mg   daily  He is off of Eliquis due to multiple falls  and severe anemia (Hgb 7.4 on 11/18)  (pancytopenia)  and thrombocyotpenai   Plt 85  Keep on tele CHADSVASc is 4   But risk for bleed and fall       2  CAD  No symptoms of angina   2  Acute on chronic systolic  Diuretics have been held with renal insuff  Cr is improving but still above baseline    Pt comfortable   Follow for now    With multiple falls, renal insuff, bradycardia he  is not on carvedilol, ACE/ARB or aldactone   Follow   Pt is fragile    3  Renal  Cr is still above baseline from Oct 2022  (1.85) or Dec 2021 (1.52 )  It is 2.36 tday  BUN 78   Continue to hold diuretics for now     4  Heme  WBC 3.1  Hgb 8.3   Plt 85    Pt just had bonemarrow bx     5  Hx HL  Keep on  Zocor    For questions or updates, please contact [place] Please consult [email or web address] for contact info under        Signed, Dorris Carnes, MD  11/28/2020, 8:33 AM

## 2020-11-28 NOTE — Progress Notes (Signed)
Bladder scan=761cc. Nguyen,MD made aware.In and out catheterization performed via aseptic technique. Drained 1000cc of clear yellow urine. Patient tolerated well. Will continue to monitor.

## 2020-11-28 NOTE — Progress Notes (Signed)
Family Medicine Teaching Service Daily Progress Note Intern Pager: [phone]  Patient name: Stanley Moore Medical record number: [id number] Date of birth: 03/27/1931 Age: 87 y.o. Gender: male  Primary Care Provider: Heywood Bene, PA-C Consultants: Cardiology, Oncology, IR Code Status: DNR/DNI  Pt Overview and Major Events to Date:  11/18-admitted  Assessment and Plan:  JAYEL INKS is a 87 y.o. male presenting with weakness and low back pain s/p fall, found to have multiple transverse process fractures as well as left 12th rib fracture. PMH is significant for hypertension, hyperlipidemia, T2DM, CKD 3b, persistent A. 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    Thrombocytopenia  Macrocytic anemia  Leukopenia  Hgb 8.3 (8.2) PLT 85 (82) WBC 3.1 (2.9), pancytopenia stable from yesterday. Patient receiving bone marrow biopsy today. -Follow up bone marrow biopsy -Follow up             Methylmalonic acid: SPEP and serum free light chains (kappa and lambda): Kappa and lambda light chains 76.9 and 44.3 respectively. Ratio 1.74 K:L -Oncology following appreciate recs -Hold Eliquis   AKI on CKD stage IIIb Cr slightly elevated at 2.36 from 2.33 yesterday. Lasix held, AKI stable to slightly worsening. -Continue to trend   Urinary Retention Patient had void of 375 mL, with bladder scan showing post void residual volume 789 mL. Patient was In/Out cathed. -Bladder scan q8hr   CHF exacerbation with HFrEF 25-30% ejection fraction  history of left bundle branch block  moderate mitral regurge UOP yesterday was 1.75 L , lasix held, patient volume status is stable and unchanged from yesterday.  -Continue to monitor weight -Strict I/Os -Hold lasix -Cardiology following appreciate recs   Weakness and low back pain s/p multiple falls  multiple transverse process fractures  left 12th rib fracture Patient pain well controlled  this morning, stable from yesterday. Patient intending to go to SNF after d/c   Persistent A. fib status post cardioversion in 2021 -Continue amiodarone 100 mg daily -Hold Eliquis -Daily CBC   DM2 CBG stable range 139-146 -Continue to monitor  Constipation -Continue Miralax 34 g daily -Continue Senna  FEN/GI: Carb modified/ heart healthy PPx: Lovenox Dispo:SNF in 2-3 days.   Subjective:  Patient laying comfortably in bed with no complaints of pain.  Objective: Temp:  [97.5 F (36.4 C)-97.9 F (36.6 C)] 97.5 F (36.4 C) (11/23 0455) Pulse Rate:  [49-62] 62 (11/23 0455) Resp:  [19] 19 (11/22 1041) BP: (105-119)/(36-52) 119/50 (11/23 0455) SpO2:  [98 %-100 %] 98 % (11/23 0455) Weight:  [86.2 kg] 86.2 kg (11/23 0600) Physical Exam: General: Well appearing, elderly, white male, NASD Cardiovascular: RRR, [place] Respiratory: CTABL Abdomen: Soft, NTTP, Non-distended Extremities: Moving all extremities independently. 3+ pitting edema to level of shins  Laboratory: Recent Labs  Lab 11/26/20 0246 11/27/20 0230 11/28/20 0255  WBC 3.2* 2.9* 3.1*  HGB 8.5* 8.2* 8.3*  HCT 26.3* 25.0* 26.3*  PLT 81* 82* 85*   Recent Labs  Lab 11/23/20 1619 11/23/20 1631 11/24/20 0301 11/25/20 0420 11/26/20 0246 11/27/20 0230 11/28/20 0255  NA 136   < > 136   < > 138 136 136  K 4.7   < > 4.8   < > 4.3 4.2 4.7  CL 115*   < > 112*   < > 113* 112* 109  CO2 15*  --  15*   < > 17* 18* 17*  BUN 99*   < > 94*   < > 84* 80*  78*  CREATININE 2.68*   < > 2.66*   < > 2.55* 2.33* 2.36*  CALCIUM 8.1*  --  8.2*   < > 8.2* 8.2* 8.3*  PROT 6.1*  --  6.0*  --   --  5.7*  --   BILITOT 0.7  --  1.6*  --   --  1.1  --   ALKPHOS 48  --  49  --   --  45  --   ALT 18  --  17  --   --  16  --   AST 13*  --  38  --   --  21  --   GLUCOSE 180*   < > 162*   < > 127* 139* 146*   < > = values in this interval not displayed.      Imaging/Diagnostic Tests:   Holley Bouche, MD 11/28/2020, 6:22  AM PGY-1, [place] Intern pager: [phone], text pages welcome

## 2020-11-28 NOTE — Progress Notes (Signed)
FPTS Brief Progress Note  S: Stanley Moore was seen this p.m. sleeping soundly.  Did not awaken patient.   O: BP (!) 105/36 (BP Location: Left Arm)   Pulse (!) 49   Temp 97.9 F (36.6 C) (Oral)   Resp 19   Ht 5\' 11"  (1.803 m)   Wt 78.3 kg   SpO2 100%   BMI 24.07 kg/m   Physical Exam Vitals and nursing note reviewed.  Constitutional:      Comments: Sleeping comfortably  Cardiovascular:     Rate and Rhythm: Bradycardia present.  Pulmonary:     Effort: Pulmonary effort is normal.     A/P: AKI on CKD stage IIIb  Baseline Cr 1.85-2.04 from Oct 2022.  Downtrending Cr (!) 2.33, from 2.55 yesterday. BUN 80. UOP 379mL. Holding lasix and spironolactone Hold nephrotoxic meds  HFrEF 25-30%  Hx of left bundle branch block  Moderate mitral regurgitation  Dry wt unclear, was thought to be 81.6 kg, but pt is currently 78 kg. K+ 4.2. UOP 340mL. No Beta-blockers due to bradycardia, ACEi/ARBs due to AoCKD Strict I/Os and daily weights BMP daily K+ >2, Mg2+ >4, replete as needed  Diuretics held per above  Persistent A. fib status post cardioversion in 2021 Continues to be bradycardic with heart rate 40s to 50s. Had beats of V tach during the day x2-3, First-degree AV block episodes x2-3.  Continue telemetry No beta-blocker per above Hold Eliquis due to frequent falls and concern for possible bleed Amiodarone 100 mg daily  - Orders reviewed. Labs for AM ordered, which was adjusted as needed.   Remainder of plan per day team/daily progress note.  Merrily Brittle, DO 11/28/2020, 12:35 AM PGY-1, [facility] Night Resident  Please page [phone] with questions.

## 2020-11-28 NOTE — Progress Notes (Signed)
PHARMACY NOTE:  Patient is s/p bone marrow biopsy per IR.  Per protocol, procedure with standard bleeding risk and Clopidogrel and prophylactic Lovenox will be held until 11/24.  Orders adjusted accordingly.  [employer], Pharm.D., BCPS Clinical phone for 11/28/2020 from 7:30-3:00 is [id number].  11/28/2020 9:50 AM

## 2020-11-29 DIAGNOSIS — N179 Acute kidney failure, unspecified: Secondary | ICD-10-CM | POA: Diagnosis not present

## 2020-11-29 LAB — BASIC METABOLIC PANEL
Anion gap: 6 (ref 5–15)
BUN: 68 mg/dL — ABNORMAL HIGH (ref 8–23)
CO2: 18 mmol/L — ABNORMAL LOW (ref 22–32)
Calcium: 8.3 mg/dL — ABNORMAL LOW (ref 8.9–10.3)
Chloride: 114 mmol/L — ABNORMAL HIGH (ref 98–111)
Creatinine, Ser: 2.34 mg/dL — ABNORMAL HIGH (ref 0.61–1.24)
GFR, Estimated: 26 mL/min — ABNORMAL LOW (ref >60)
Glucose, Bld: 140 mg/dL — ABNORMAL HIGH (ref 70–99)
Potassium: 4.8 mmol/L (ref 3.5–5.1)
Sodium: 138 mmol/L (ref 135–145)

## 2020-11-29 LAB — CBC
HCT: 24.6 % — ABNORMAL LOW (ref 39.0–52.0)
Hemoglobin: 7.8 g/dL — ABNORMAL LOW (ref 13.0–17.0)
MCH: 36.6 pg — ABNORMAL HIGH (ref 26.0–34.0)
MCHC: 31.7 g/dL (ref 30.0–36.0)
MCV: 115.5 fL — ABNORMAL HIGH (ref 80.0–100.0)
Platelets: 79 10*3/uL — ABNORMAL LOW (ref 150–400)
RBC: 2.13 MIL/uL — ABNORMAL LOW (ref 4.22–5.81)
RDW: 20.8 % — ABNORMAL HIGH (ref 11.5–15.5)
WBC: 3.8 10*3/uL — ABNORMAL LOW (ref 4.0–10.5)
nRBC: 0 % (ref 0.0–0.2)

## 2020-11-29 LAB — HEMOGLOBIN AND HEMATOCRIT, BLOOD
HCT: 28.1 % — ABNORMAL LOW (ref 39.0–52.0)
Hemoglobin: 9 g/dL — ABNORMAL LOW (ref 13.0–17.0)

## 2020-11-29 LAB — PREPARE RBC (CROSSMATCH)

## 2020-11-29 MED ORDER — TAMSULOSIN HCL 0.4 MG PO CAPS
0.4000 mg | ORAL_CAPSULE | Freq: Every day | ORAL | Status: DC
Start: 2020-11-29 — End: 2020-12-02
  Administered 2020-11-29 – 2020-12-02 (×4): 0.4 mg via ORAL
  Filled 2020-11-29 (×4): qty 1

## 2020-11-29 MED ORDER — SODIUM CHLORIDE 0.9% IV SOLUTION: Freq: Once | INTRAVENOUS | Status: AC

## 2020-11-29 MED ORDER — TORSEMIDE 20 MG PO TABS
20.0000 mg | ORAL_TABLET | Freq: Once | ORAL | Status: AC
  Administered 2020-11-29: 20 mg via ORAL
  Filled 2020-11-29: qty 1

## 2020-11-29 MED ORDER — CHLORHEXIDINE GLUCONATE CLOTH 2 % EX PADS
6.0000 | MEDICATED_PAD | Freq: Every day | CUTANEOUS | Status: DC
  Administered 2020-11-29 – 2020-12-03 (×5): 6 via TOPICAL

## 2020-11-29 NOTE — Progress Notes (Signed)
Family Medicine Teaching Service Daily Progress Note Intern Pager: [phone]  Patient name: Stanley Moore Medical record number: [id number] Date of birth: 1931/10/18 Age: 87 y.o. Gender: male  Primary Care Provider: Merwyn Katos Consultants: Cardiology, oncology, IR Code Status: DNR   Pt Overview and Major Events to Date:  11/18: Admitted and transfused PRBC 1 unit 11/21: Held Lasix 11/22: Urinary retention 11/23: Bone marrow biopsy 11/24: Blood transfusion PRBC 1 unit  Assessment and Plan: Stanley Moore is a 87 y.o. male presented with weakness and low back pain s/p fall and found to have multiple TP and 12th rib fractures. PMHx of HTN, HLD, T2DM, CKD 3B, persistent A. fib, HFrEF (25-30%), AV block, mitral regurg, CAD, NSTEMI (10/13/2020).   Pancytopenia  WBC 3.8 (3.1), Hb 7.8 (8.3), PLT 79 (85).  We will transfuse patient 11/24 for Hb <8. Will consider palliative consult given benefit and risk and utility in aggressive treatment. Oncology consulted, appreciate assistance and recommendations Continue to hold Eliquis Pending bone marrow biopsy pathology impression  AoCKD 3B Cr (!) 2.34 from 2.36, BUN 68 from 78. Continue to hold Lasix  Urinary retention  BPH Bladder scan showed 761 mL and drained 1 L of urine. Bladder scan every 8 hours Tamsulosin 0.4 mg daily 30 minutes after food  Transient V. Tach Patient had 2 rounds of intermittent and transient V. tach on telemetry. We will continue to monitor  CHF exacerbation with HFrEF 25-30% ejection fraction  history of left bundle branch block  moderate mitral regurge UOP 1.2 L. Admission weight 90.6 kg, current weight 86.2 kg.  Currently not diuresing. We will continue to assess volume status. Cardiology consulted, appreciate recommendations and assistance Strict I/Os and daily weights Hold Lasix   Weakness and low back pain s/p multiple falls  multiple transverse process fractures  left 12th  rib fracture Dispo to SNF. Pain is stable.  Persistent A. fib status post cardioversion in 2021 Amiodarone 100 mg daily Hold Plavix  Hold Eliquis  T2DM CBG 140 (IP BG goal <180).  We will continue to monitor  Constipation BM x2 on 11/23. Continue scheduled MiraLAX and Senokot daily  FEN/GI: Carb modified, Other (Comment)  PPx: enoxaparin (LOVENOX) injection 30 mg Start: 11/29/20 1000 Dispo:SNF in 2-3 days. Barriers include heme-onc work-up.   Subjective:  [name] was seen this AM.  He reported feeling fine, and had no acute complaints.  Rather he was wondering when he could go home.  Objective: Temp:  [97.6 F (36.4 C)-99.2 F (37.3 C)] 99.2 F (37.3 C) (11/24 0531) Pulse Rate:  [56-61] 57 (11/24 0531) Resp:  [11-18] 16 (11/24 0531) BP: (109-128)/(46-64) 112/64 (11/24 0531) SpO2:  [97 %-100 %] 98 % (11/24 0531) Physical Exam Vitals and nursing note reviewed.  Constitutional:      General: He is not in acute distress.    Appearance: He is ill-appearing. He is not diaphoretic.  HENT:     Mouth/Throat:     Comments: Lips appeared cyanotic Cardiovascular:     Rate and Rhythm: Regular rhythm. Bradycardia present.  Pulmonary:     Effort: Pulmonary effort is normal. No respiratory distress.     Breath sounds: No wheezing or rales.  Musculoskeletal:        General: No tenderness.     Right lower leg: Edema present.     Left lower leg: Edema present.     Comments: 2+ pitting edema up to mid posterior shin bilaterally  Skin:    General:  Skin is warm and dry.     Findings: Lesion present.  Neurological:     Mental Status: He is alert.     Laboratory: Recent Labs  Lab 11/27/20 0230 11/28/20 0255 11/29/20 0304  WBC 2.9* 3.1* 3.8*  HGB 8.2* 8.3* 7.8*  HCT 25.0* 26.3* 24.6*  PLT 82* 85* 79*   Recent Labs  Lab 11/23/20 1619 11/23/20 1631 11/24/20 0301 11/25/20 0420 11/27/20 0230 11/28/20 0255 11/29/20 0304  NA 136   < > 136   < > 136 136 138  K 4.7    < > 4.8   < > 4.2 4.7 4.8  CL 115*   < > 112*   < > 112* 109 114*  CO2 15*  --  15*   < > 18* 17* 18*  BUN 99*   < > 94*   < > 80* 78* 68*  CREATININE 2.68*   < > 2.66*   < > 2.33* 2.36* 2.34*  CALCIUM 8.1*  --  8.2*   < > 8.2* 8.3* 8.3*  PROT 6.1*  --  6.0*  --  5.7*  --   --   BILITOT 0.7  --  1.6*  --  1.1  --   --   ALKPHOS 48  --  49  --  45  --   --   ALT 18  --  17  --  16  --   --   AST 13*  --  38  --  21  --   --   GLUCOSE 180*   < > 162*   < > 139* 146* 140*   < > = values in this interval not displayed.   Results for orders placed or performed during the hospital encounter of 11/23/20 (from the past 24 hour(s))  Glucose, capillary     Status: Abnormal   Collection Time: 11/28/20  8:43 PM  Result Value Ref Range   Glucose-Capillary 160 (H) 70 - 99 mg/dL  CBC     Status: Abnormal   Collection Time: 11/29/20  3:04 AM  Result Value Ref Range   WBC 3.8 (L) 4.0 - 10.5 K/uL   RBC 2.13 (L) 4.22 - 5.81 MIL/uL   Hemoglobin 7.8 (L) 13.0 - 17.0 g/dL   HCT 24.6 (L) 39.0 - 52.0 %   MCV 115.5 (H) 80.0 - 100.0 fL   MCH 36.6 (H) 26.0 - 34.0 pg   MCHC 31.7 30.0 - 36.0 g/dL   RDW 20.8 (H) 11.5 - 15.5 %   Platelets 79 (L) 150 - 400 K/uL   nRBC 0.0 0.0 - 0.2 %  Basic metabolic panel     Status: Abnormal   Collection Time: 11/29/20  3:04 AM  Result Value Ref Range   Sodium 138 135 - 145 mmol/L   Potassium 4.8 3.5 - 5.1 mmol/L   Chloride 114 (H) 98 - 111 mmol/L   CO2 18 (L) 22 - 32 mmol/L   Glucose, Bld 140 (H) 70 - 99 mg/dL   BUN 68 (H) 8 - 23 mg/dL   Creatinine, Ser 2.34 (H) 0.61 - 1.24 mg/dL   Calcium 8.3 (L) 8.9 - 10.3 mg/dL   GFR, Estimated 26 (L) >60 mL/min   Anion gap 6 5 - 15  Prepare RBC (crossmatch)     Status: None   Collection Time: 11/29/20  4:20 AM  Result Value Ref Range   Order Confirmation      ORDER PROCESSED BY BLOOD BANK  Performed at [facility], [place] [address], [place], [place] [address]   Type and screen Stanley Moore     Status:  None (Preliminary result)   Collection Time: 11/29/20  4:20 AM  Result Value Ref Range   ABO/RH(D) O POS    Antibody Screen NEG    Sample Expiration      12/02/2020,2359 Performed at [facility], [place] [address] [place], [place] [address]    Unit Number [id number]    Blood Component Type RED CELLS,LR    Unit division 00    Status of Unit ALLOCATED    Transfusion Status OK TO TRANSFUSE    Crossmatch Result Compatible     Imaging/Diagnostic Tests: CT BONE MARROW BIOPSY & ASPIRATION  Result Date: 11/28/2020 INDICATION: Pancytopenia of uncertain etiology. Please perform CT-guided bone marrow biopsy for tissue diagnostic purposes. EXAM: CT-GUIDED BONE MARROW BIOPSY AND ASPIRATION MEDICATIONS: None ANESTHESIA/SEDATION: Moderate (conscious) sedation was employed during this procedure as administered by the Interventional Radiology RN. A total of Versed 1.5 mg and Fentanyl 75 mcg was administered intravenously. Moderate Sedation Time: 11 minutes. The patient's level of consciousness and vital signs were monitored continuously by radiology nursing throughout the procedure under my direct supervision. COMPLICATIONS: None immediate. PROCEDURE: Informed consent was obtained from the patient following an explanation of the procedure, risks, benefits and alternatives. The patient understands, agrees and consents for the procedure. All questions were addressed. A time out was performed prior to the initiation of the procedure. The patient was positioned prone and non-contrast localization CT was performed of the pelvis to demonstrate the iliac marrow spaces. The operative site was prepped and draped in the usual sterile fashion. Under sterile conditions and local anesthesia, a 22 gauge spinal needle was utilized for procedural planning. Next, an 11 gauge coaxial bone biopsy needle was advanced into the left iliac marrow space. Needle position was confirmed with CT imaging. Initially, a bone marrow  aspiration was performed. Next, a bone marrow biopsy was obtained with the 11 gauge outer bone marrow device. The 11 gauge coaxial bone biopsy needle was re-advanced into a slightly different location within the left iliac marrow space, positioning was confirmed with CT imaging and an additional bone marrow biopsy was obtained. Samples were prepared with the cytotechnologist and deemed adequate. The needle was removed and superficial hemostasis was obtained with manual compression. A dressing was applied. The patient tolerated the procedure well without immediate post procedural complication. IMPRESSION: Successful CT guided left iliac bone marrow aspiration and core biopsy. Electronically Signed   By: Sandi Mariscal M.D.   On: 11/28/2020 12:06     Merrily Brittle, DO 11/29/2020, 6:18 AM PGY-1, [facility] Intern pager: [phone], text pages welcome

## 2020-11-29 NOTE — Progress Notes (Signed)
Called patient's daughter to discuss possible palliative care consult.  Discussed the benefits of trying to discuss the goals of care and how far to go with treatments for his conditions. Family is agreeable to having the discussion, daughter does note that the conversations will need to be through her as her mother (patient spouse) has short term memory issues. They know that the patient is not wanting to proceed with aggressive care for some of his conditions and would be appreciative of discussion about planning for the future. We will place consult to palliative.   Tawnie Ehresman, DO

## 2020-11-29 NOTE — Progress Notes (Signed)
FPTS Brief Progress Note  S: Stanley Moore was seen this p.m. sleeping soundly.  Did not awaken patient   O: BP (!) 109/49 (BP Location: Left Arm)   Pulse (!) 57   Temp 98.2 F (36.8 C) (Oral)   Resp 16   Ht 5\' 11"  (1.803 m)   Wt 86.2 kg   SpO2 97%   BMI 26.50 kg/m   Physical Exam Vitals and nursing note reviewed.  Constitutional:      Comments: Asleep  Cardiovascular:     Rate and Rhythm: Bradycardia present.  Pulmonary:     Effort: Pulmonary effort is normal.     A/P: Urinary retention  BPH Paged by nurse for 761 mL on bladder scan, drained 1 L of urine. Bladder scan every 8 hours Tamsulosin 0.4 mg daily 30 minutes after a meal  Transient V. Tach Patient had 2 rounds of intermittent and transient V. tach on telemetry. We will continue to monitor  - Orders reviewed. Labs for AM ordered, which was adjusted as needed.   Remainder of plan per day team/daily progress note.  Merrily Brittle, DO 11/29/2020, 3:57 AM PGY-1,  Family Medicine Night Resident  Please page [phone] with questions.

## 2020-11-29 NOTE — Progress Notes (Signed)
Bladder scan=>358cc Nguyen,MD at bedside. Order to perform in and out catheterization. I/O cath done aseptically without difficulty, drained 500cc of clear yellow urine. Patient tolerated it well.

## 2020-11-29 NOTE — Progress Notes (Signed)
Cardiology Progress Note  Patient ID: Stanley Moore MRN: [id number] DOB: [birth year] Date of Encounter: 11/29/2020  Primary Cardiologist: Evalina Field, MD  Subjective   Chief Complaint: back pain  HPI: Bone marrow biopsy yesterday.  Awaiting results of that.  Hemoglobin continues to decline.  Receiving blood today.  Reports he is ready go home.  ROS:  All other ROS reviewed and negative. Pertinent positives noted in the HPI.     Inpatient Medications  Scheduled Meds:  acetaminophen  650 mg Oral Q6H   Or   acetaminophen  650 mg Rectal Q6H   amiodarone  100 mg Oral Daily   polyethylene glycol  17 g Oral BID   senna  1 tablet Oral BID   simvastatin  20 mg Oral QPM   tamsulosin  0.4 mg Oral QPC breakfast   Continuous Infusions:  PRN Meds: liver oil-zinc oxide, oxyCODONE   Vital Signs   Vitals:   11/28/20 2152 11/29/20 0531 11/29/20 0628 11/29/20 0659  BP: (!) 109/49 112/64 (!) 108/52 (!) 110/44  Pulse: (!) 57 (!) 57 (!) 58 (!) 57  Resp: _0 Temp: 98.2 F (36.8 C) 99.2 F (37.3 C) 97.7 F (36.5 C) 97.7 F (36.5 C)  TempSrc: Oral Oral Oral Oral  SpO2: 97% 98% 94% 99%  Weight:      Height:        Intake/Output Summary (Last 24 hours) at 11/29/2020 0847 Last data filed at 11/29/2020 0700 Gross per 24 hour  Intake 834 ml  Output 1275 ml  Net -441 ml   Last 3 Weights 11/28/2020 11/27/2020 11/26/2020  Weight (lbs) 190 lb 0.6 oz 172 lb 9.6 oz 190 lb 14.7 oz  Weight (kg) 86.2 kg 78.291 kg 86.6 kg      Telemetry  Overnight telemetry shows sinus bradycardia heart rate in the 50s, which I personally reviewed.   Physical Exam   Vitals:   11/28/20 2152 11/29/20 0531 11/29/20 0628 11/29/20 0659  BP: (!) 109/49 112/64 (!) 108/52 (!) 110/44  Pulse: (!) 57 (!) 57 (!) 58 (!) 57  Resp: _1 Temp: 98.2 F (36.8 C) 99.2 F (37.3 C) 97.7 F (36.5 C) 97.7 F (36.5 C)  TempSrc: Oral Oral Oral Oral  SpO2: 97% 98% 94% 99%  Weight:       Height:        Intake/Output Summary (Last 24 hours) at 11/29/2020 0847 Last data filed at 11/29/2020 0700 Gross per 24 hour  Intake 834 ml  Output 1275 ml  Net -441 ml    Last 3 Weights 11/28/2020 11/27/2020 11/26/2020  Weight (lbs) 190 lb 0.6 oz 172 lb 9.6 oz 190 lb 14.7 oz  Weight (kg) 86.2 kg 78.291 kg 86.6 kg    Body mass index is 26.5 kg/m.   General: Well nourished, well developed, in no acute distress Head: Atraumatic, normal size  Eyes: PEERLA, EOMI  Neck: Supple, JVD 7 8 cm of water Endocrine: No thryomegaly Cardiac: Normal S1, S2; RRR; no murmurs, rubs, or gallops Lungs: Diminished breath sounds at the lung bases Abd: Soft, nontender, no hepatomegaly  Ext: No edema, pulses 2+ Musculoskeletal: No deformities, BUE and BLE strength normal and equal Skin: Warm and dry, no rashes   Neuro: Alert and oriented to person, place, time, and situation, CNII-XII grossly intact, no focal deficits  Psych: Normal mood and affect   Labs  High Sensitivity Troponin:   Recent Labs  Lab 11/23/20 1619 11/23/20  1944  TROPONINIHS 65* 64*     Cardiac EnzymesNo results for input(s): TROPONINI in the last 168 hours. No results for input(s): TROPIPOC in the last 168 hours.  Chemistry Recent Labs  Lab 11/23/20 1619 11/23/20 1631 11/24/20 0301 11/25/20 0420 11/27/20 0230 11/28/20 0255 11/29/20 0304  NA 136   < > 136   < > 136 136 138  K 4.7   < > 4.8   < > 4.2 4.7 4.8  CL 115*   < > 112*   < > 112* 109 114*  CO2 15*  --  15*   < > 18* 17* 18*  GLUCOSE 180*   < > 162*   < > 139* 146* 140*  BUN 99*   < > 94*   < > 80* 78* 68*  CREATININE 2.68*   < > 2.66*   < > 2.33* 2.36* 2.34*  CALCIUM 8.1*  --  8.2*   < > 8.2* 8.3* 8.3*  PROT 6.1*  --  6.0*  --  5.7*  --   --   ALBUMIN 2.9*  --  3.0*  --  2.8*  --   --   AST 13*  --  38  --  21  --   --   ALT 18  --  17  --  16  --   --   ALKPHOS 48  --  49  --  45  --   --   BILITOT 0.7  --  1.6*  --  1.1  --   --   GFRNONAA 22*  --   23*   < > 26* 26* 26*  ANIONGAP 6  --  9   < > _0 < > = values in this interval not displayed.    Hematology Recent Labs  Lab 11/27/20 0230 11/28/20 0255 11/29/20 0304  WBC 2.9* 3.1* 3.8*  RBC 2.19* 2.25* 2.13*  HGB 8.2* 8.3* 7.8*  HCT 25.0* 26.3* 24.6*  MCV 114.2* 116.9* 115.5*  MCH 37.4* 36.9* 36.6*  MCHC 32.8 31.6 31.7  RDW 21.1* 20.7* 20.8*  PLT 82* 85* 79*   BNP Recent Labs  Lab 11/24/20 0301  BNP 1,126.0*    DDimer No results for input(s): DDIMER in the last 168 hours.   Radiology  CT BONE MARROW BIOPSY & ASPIRATION  Result Date: 11/28/2020 INDICATION: Pancytopenia of uncertain etiology. Please perform CT-guided bone marrow biopsy for tissue diagnostic purposes. EXAM: CT-GUIDED BONE MARROW BIOPSY AND ASPIRATION MEDICATIONS: None ANESTHESIA/SEDATION: Moderate (conscious) sedation was employed during this procedure as administered by the Interventional Radiology RN. A total of Versed 1.5 mg and Fentanyl 75 mcg was administered intravenously. Moderate Sedation Time: 11 minutes. The patient's level of consciousness and vital signs were monitored continuously by radiology nursing throughout the procedure under my direct supervision. COMPLICATIONS: None immediate. PROCEDURE: Informed consent was obtained from the patient following an explanation of the procedure, risks, benefits and alternatives. The patient understands, agrees and consents for the procedure. All questions were addressed. A time out was performed prior to the initiation of the procedure. The patient was positioned prone and non-contrast localization CT was performed of the pelvis to demonstrate the iliac marrow spaces. The operative site was prepped and draped in the usual sterile fashion. Under sterile conditions and local anesthesia, a 22 gauge spinal needle was utilized for procedural planning. Next, an 11 gauge coaxial bone biopsy needle was advanced into the left iliac marrow space. Needle position  was  confirmed with CT imaging. Initially, a bone marrow aspiration was performed. Next, a bone marrow biopsy was obtained with the 11 gauge outer bone marrow device. The 11 gauge coaxial bone biopsy needle was re-advanced into a slightly different location within the left iliac marrow space, positioning was confirmed with CT imaging and an additional bone marrow biopsy was obtained. Samples were prepared with the cytotechnologist and deemed adequate. The needle was removed and superficial hemostasis was obtained with manual compression. A dressing was applied. The patient tolerated the procedure well without immediate post procedural complication. IMPRESSION: Successful CT guided left iliac bone marrow aspiration and core biopsy. Electronically Signed   By: Sandi Mariscal M.D.   On: 11/28/2020 12:06    Cardiac Studies  TTE 11/13/2020  1. Compared to 12/15/19 MR is now severe.   2. Global hypokinesis with akinesis of the basal inferolateral wall;  overall severe LV dysfunction.   3. Left ventricular ejection fraction, by estimation, is 25 to 30%. The  left ventricle has severely decreased function. The left ventricle  demonstrates regional wall motion abnormalities (see scoring  diagram/findings for description). The left  ventricular internal cavity size was moderately dilated. Left ventricular  diastolic parameters are consistent with Grade II diastolic dysfunction  (pseudonormalization). Elevated left atrial pressure.   4. Right ventricular systolic function is mildly reduced. The right  ventricular size is mildly enlarged. There is severely elevated pulmonary  artery systolic pressure.   5. Left atrial size was severely dilated.   6. Right atrial size was severely dilated.   7. The mitral valve is degenerative. Severe mitral valve regurgitation.  No evidence of mitral stenosis.   8. The aortic valve is tricuspid. Aortic valve regurgitation is trivial.  Mild aortic valve sclerosis is present, with no  evidence of aortic valve  stenosis.   9. The inferior vena cava is dilated in size with <50% respiratory  variability, suggesting right atrial pressure of 15 mmHg.   Patient Profile  Stanley Moore is a 87 y.o. male with systolic heart failure (EF 25-30%, sinus bradycardia, moderate severe mitral regurgitation, CAD with non-STEMI, diabetes, CKD stage III, persistent atrial fibrillation status post cardioversion who was admitted on 11/23/2020 with weakness and multiple falls.  He was found to have multiple rib fractures as well as symptomatic anemia.  Assessment & Plan   #Acute on chronic systolic heart failure, EF 25 to 30% #CAD, presumed ischemic cardiomyopathy, has declined heart catheterization in the past #Moderate severe mitral regurgitation -Admitted with falls and symptomatic anemia.  He has a known cardiomyopathy and has refused invasive evaluation in the past. -He is now here with symptomatic anemia as well as thrombocytopenia.  He also has suffered falls. -Has also suffered an AKI. -Troponins this admission are negative. -He also has significant bradycardia and blood pressures are marginal. -Overall he has an advanced care myopathy.  He is several comorbidities that preclude invasive evaluation including advanced kidney disease as well as now thrombocytopenia and anemia.  In addition he is never really wanted aggressive cardiovascular care. -Overall I think his focus of care should be palliative if not a hospice approach.  We are awaiting the bone marrow biopsy results to come back. -Given bradycardia he is on a beta-blocker.  Given marginal blood pressures he is not on hydralazine or Imdur.  Given his CKD he is not a candidate for ACE/ARB/Arni/MRA moving forward.  I think we should be very cautious with aggressive medical management for him. -I did  discuss this with Mr. Gripp today.  He is in agreement that he is not wanting aggressive care. -would consider palliative  consult  -I will add torsemide 20 mg one-time dose.  He is receiving blood products.  I do not want him to develop transfusion associated circulatory overload.  #CAD #Recent NSTEMI -no ASA due to anemia -statin ok -no BB due to bradycardia -no CP  #Persistent Afib #Sinus bradycardia -Clearly has evidence of conduction disease and sinus node dysfunction with bradycardia. -Status post cardioversion and is the etiology of his cardiomyopathy was thought to be A. fib related.  EF is never recovered.  Highly suspect an ischemic cardiomyopathy. -For comfort would continue with rhythm control strategy with amiodarone 100 mg daily. -He is not a candidate for pacing or any aggressive cardiovascular care at this time. -Eliquis is clearly and appropriately on hold given falls and anemia.  He is now requiring blood transfusions.  Work-up per hematology with recent bone marrow biopsy.  #AKI on CKD IV -Kidney function likely has reached its nadir.  This is his baseline which is CKD stage IV. -One-time dose of torsemide today.  #Pancytopenia #MDS? -Now with symptomatic anemia with thrombocytopenia and leukopenia.  Awaiting bone marrow biopsy results. -Clearly this complicates his cardiovascular care.  No anticoagulation.  No aspirin.  For questions or updates, please contact [place] Please consult [email or web address] for contact info under   Time Spent with Patient: I have spent a total of 35 minutes with patient reviewing hospital notes, telemetry, EKGs, labs and examining the patient as well as establishing an assessment and plan that was discussed with the patient.  > 50% of time was spent in direct patient care.    Signed, Addison Naegeli. Audie Box, MD, [place]  11/29/2020 8:47 AM

## 2020-11-30 DIAGNOSIS — I340 Nonrheumatic mitral (valve) insufficiency: Secondary | ICD-10-CM | POA: Diagnosis not present

## 2020-11-30 DIAGNOSIS — I480 Paroxysmal atrial fibrillation: Secondary | ICD-10-CM | POA: Diagnosis not present

## 2020-11-30 DIAGNOSIS — I5022 Chronic systolic (congestive) heart failure: Secondary | ICD-10-CM | POA: Diagnosis not present

## 2020-11-30 DIAGNOSIS — N179 Acute kidney failure, unspecified: Secondary | ICD-10-CM | POA: Diagnosis not present

## 2020-11-30 LAB — TYPE AND SCREEN
ABO/RH(D): O POS
Antibody Screen: NEGATIVE
Unit division: 0

## 2020-11-30 LAB — CBC
HCT: 26.2 % — ABNORMAL LOW (ref 39.0–52.0)
Hemoglobin: 8.4 g/dL — ABNORMAL LOW (ref 13.0–17.0)
MCH: 35.9 pg — ABNORMAL HIGH (ref 26.0–34.0)
MCHC: 32.1 g/dL (ref 30.0–36.0)
MCV: 112 fL — ABNORMAL HIGH (ref 80.0–100.0)
Platelets: 82 10*3/uL — ABNORMAL LOW (ref 150–400)
RBC: 2.34 MIL/uL — ABNORMAL LOW (ref 4.22–5.81)
RDW: 22.1 % — ABNORMAL HIGH (ref 11.5–15.5)
WBC: 3.5 10*3/uL — ABNORMAL LOW (ref 4.0–10.5)
nRBC: 0 % (ref 0.0–0.2)

## 2020-11-30 LAB — BASIC METABOLIC PANEL
Anion gap: 7 (ref 5–15)
BUN: 70 mg/dL — ABNORMAL HIGH (ref 8–23)
CO2: 19 mmol/L — ABNORMAL LOW (ref 22–32)
Calcium: 8.3 mg/dL — ABNORMAL LOW (ref 8.9–10.3)
Chloride: 111 mmol/L (ref 98–111)
Creatinine, Ser: 2.54 mg/dL — ABNORMAL HIGH (ref 0.61–1.24)
GFR, Estimated: 24 mL/min — ABNORMAL LOW (ref >60)
Glucose, Bld: 139 mg/dL — ABNORMAL HIGH (ref 70–99)
Potassium: 4.6 mmol/L (ref 3.5–5.1)
Sodium: 137 mmol/L (ref 135–145)

## 2020-11-30 LAB — BPAM RBC
Blood Product Expiration Date: 202212212359
ISSUE DATE / TIME: 202211240636
Unit Type and Rh: 5100

## 2020-11-30 LAB — METHYLMALONIC ACID, SERUM: Methylmalonic Acid, Quantitative: 274 nmol/L (ref 0–378)

## 2020-11-30 MED ORDER — SENNA 8.6 MG PO TABS: 1.0000 | ORAL_TABLET | Freq: Every day | ORAL | Status: DC | PRN

## 2020-11-30 NOTE — Progress Notes (Signed)
Progress Note  Patient Name: Stanley Moore Date of Encounter: 11/30/2020  [facility] Cardiologist: Evalina Field, MD   Subjective   Breathing is good at rest   In bed  No CP    Inpatient Medications    Scheduled Meds:  acetaminophen  650 mg Oral Q6H   Or   acetaminophen  650 mg Rectal Q6H   amiodarone  100 mg Oral Daily   Chlorhexidine Gluconate Cloth  6 each Topical Daily   polyethylene glycol  17 g Oral BID   simvastatin  20 mg Oral QPM   tamsulosin  0.4 mg Oral QPC breakfast   Continuous Infusions:  PRN Meds: liver oil-zinc oxide, oxyCODONE, senna   Vital Signs    Vitals:   11/29/20 0935 11/29/20 2310 11/30/20 0600 11/30/20 0911  BP: (!) 115/52 (!) 102/48 (!) 113/51 132/64  Pulse: (!) 58 67 (!) 56 61  Resp: 18 12 16 18   Temp: (!) 97.5 F (36.4 C) 100.1 F (37.8 C) 98.8 F (37.1 C) 98.7 F (37.1 C)  TempSrc: Oral Oral Oral Oral  SpO2: 99% 95% 98% 94%  Weight:      Height:        Intake/Output Summary (Last 24 hours) at 11/30/2020 0940 Last data filed at 11/30/2020 0600 Gross per 24 hour  Intake 720 ml  Output 2400 ml  Net -1680 ml   Last 3 Weights 11/28/2020 11/27/2020 11/26/2020  Weight (lbs) 190 lb 0.6 oz 172 lb 9.6 oz 190 lb 14.7 oz  Weight (kg) 86.2 kg 78.291 kg 86.6 kg      Telemetry      Sinus bradycardia  50s      - Personally Reviewed  ECG    No new  - Personally Reviewed  Physical Exam   GEN: No acute distress.  Sitting in chair with brace on Neck: JVP is increased Cardiac: RRR, Gr II/VI systolic murmur    Respiratory: Clear to auscultation bilaterally. GI: Soft, nontender, non-distended  MS: Triv edema; No deformity. Neuro:  Nonfocal  Psych: Normal affect   Labs    High Sensitivity Troponin:   Recent Labs  Lab 11/23/20 1619 11/23/20 1944  TROPONINIHS 65* 64*     Chemistry Recent Labs  Lab 11/23/20 1619 11/23/20 1631 11/24/20 0301 11/25/20 0420 11/26/20 0246 11/27/20 0230 11/28/20 0255  11/29/20 0304 11/30/20 0325  NA 136   < > 136   < > 138 136 136 138 137  K 4.7   < > 4.8   < > 4.3 4.2 4.7 4.8 4.6  CL 115*   < > 112*   < > 113* 112* 109 114* 111  CO2 15*  --  15*   < > 17* 18* 17* 18* 19*  GLUCOSE 180*   < > 162*   < > 127* 139* 146* 140* 139*  BUN 99*   < > 94*   < > 84* 80* 78* 68* 70*  CREATININE 2.68*   < > 2.66*   < > 2.55* 2.33* 2.36* 2.34* 2.54*  CALCIUM 8.1*  --  8.2*   < > 8.2* 8.2* 8.3* 8.3* 8.3*  MG 2.3  --   --   --  2.3  --   --   --   --   PROT 6.1*  --  6.0*  --   --  5.7*  --   --   --   ALBUMIN 2.9*  --  3.0*  --   --  2.8*  --   --   --   AST 13*  --  38  --   --  21  --   --   --   ALT 18  --  17  --   --  16  --   --   --   ALKPHOS 48  --  49  --   --  45  --   --   --   BILITOT 0.7  --  1.6*  --   --  1.1  --   --   --   GFRNONAA 22*  --  23*   < > 24* 26* 26* 26* 24*  ANIONGAP 6  --  9   < > 8 6 10 6 7    < > = values in this interval not displayed.    Lipids No results for input(s): CHOL, TRIG, HDL, LABVLDL, LDLCALC, CHOLHDL in the last 168 hours.  Hematology Recent Labs  Lab 11/28/20 0255 11/29/20 0304 11/29/20 1629 11/30/20 0325  WBC 3.1* 3.8*  --  3.5*  RBC 2.25* 2.13*  --  2.34*  HGB 8.3* 7.8* 9.0* 8.4*  HCT 26.3* 24.6* 28.1* 26.2*  MCV 116.9* 115.5*  --  112.0*  MCH 36.9* 36.6*  --  35.9*  MCHC 31.6 31.7  --  32.1  RDW 20.7* 20.8*  --  22.1*  PLT 85* 79*  --  82*   Thyroid  Recent Labs  Lab 11/24/20 0832  TSH 5.194*    BNP Recent Labs  Lab 11/24/20 0301  BNP 1,126.0*    DDimer No results for input(s): DDIMER in the last 168 hours.   Radiology    No results found.  Cardiac Studies   1. Compared to 12/15/19 MR is now severe. Global hypokinesis with akinesis of the basal inferolateral wall; overall severe LV dysfunction. 2. Left ventricular ejection fraction, by estimation, is 25 to 30%. The left ventricle has severely decreased function. The left ventricle demonstrates regional wall motion abnormalities (see  scoring diagram/findings for description). The left ventricular internal cavity size was moderately dilated. Left ventricular diastolic parameters are consistent with Grade II diastolic dysfunction (pseudonormalization). Elevated left atrial pressure. 3. Right ventricular systolic function is mildly reduced. The right ventricular size is mildly enlarged. There is severely elevated pulmonary artery systolic pressure. 4. 5. Left atrial size was severely dilated. 6. Right atrial size was severely dilated. The mitral valve is degenerative. Severe mitral valve regurgitation. No evidence of mitral stenosis. 7. The aortic valve is tricuspid. Aortic valve regurgitation is trivial. Mild aortic valve sclerosis is present, with no evidence of aortic valve stenosis. 8. The inferior vena cava is dilated in size with <50% respiratory variability, suggesting right atrial pressure of 15 mmHg. 9. Comparison(s): Prior EF 25-30%.  Patient Profile  MALACAI GRANTZ is a 87 y.o. male with a hx of CAD, LBBB, paroxysmal atrial fibrillation, diabetes mellitus, chronic systolic heart failure, severe mitral regurgitation, hypertension, hyperlipidemia and chronic kidney disease stage III/IV who is being seen 11/24/2020 for the evaluation of CHF at the request of Dr. Andria Frames.  Assessment & Plan    1  PAF   Patient still remains  sinus bradycardia     Rates 40s to 60s    He is off of Bblockers.   Still on amiodarone to maintain SR  100 mg   daily  He is off of Eliquis due to multiple falls  and severe anemia (Hgb 7.4 on 11/18)  (  pancytopenia)  and thrombocyotpenia  Keep on tele CHADSVASc is 4   But risk for bleed and fall       2  CAD  Recent NSTEMI  No symptoms of angina Only on statin  No ASA due to plts   Follow    2  Acute on chronic systolic  CHF   Pt comfortable but volume is up   REcieved torsemide x 1 yesterday  Cr bumped to 2.54 toady   Follow for now    With multiple falls, renal insuff, bradycardia  he is not on carvedilol, ACE/ARB or aldactone   Follow   Pt is fragile    3  Renal  Cr is still above baseline from Oct 2022  (1.85) or Dec 2021 (1.52 )  Today 2.54   Got torsemide yesterday   I would hold today    Unfort intermitt dosing   4  MR   Moderate / severe on echo      5   Heme  WBC 3.5  Hgb 8.4   Plt 82   Pt just had bonemarrow bx     5  Hx HL  Keep on Zocor    Pt very fragile  Would approch toward palliative care      Will see again on Monday Please call wit hquestions If leaves hosp, please notifiy   We will make sure has f/u    For questions or updates, please contact [facility] Please consult [email or web address] for contact info under        Signed, Dorris Carnes, MD  11/30/2020, 9:40 AM

## 2020-11-30 NOTE — Progress Notes (Signed)
Palliative:  Consult received and chart reviewed. At the time I contacted family to arrange family meeting they let me know they were leaving shortly and unable to meet today - requested to meet Saturday 11/26 AM. Will ask one of my team members to reach out to family tomorrow AM.   Juel Burrow, DNP, [facility] Palliative Medicine Team Team Phone # [phone]  Pager # [phone]  NO CHARGE

## 2020-11-30 NOTE — Progress Notes (Signed)
Physical Therapy Treatment Patient Details Name: MAKAYLA CONFER MRN: [id number] DOB: May 26, 1931 Today's Date: 11/30/2020   History of Present Illness 87 y.o. male presents to [facility] hospital on 11/23/2020 after multiple falls. PT found to have L 12th rib fx as well as L1-3 TP fxs and L1 corner fx of vertebral body. Hgb 7.4 on admission. PMH includes HTN, HLD, T2DM, CKD 3b, persistent A. 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    PT Comments    Pt pleasant and willing to mobilize. Pt with max assist to don brace with education for sequence. Pt limited by fatigue this session stating sat EOb but not OOB prior date. Pt educated for HEP and continued progression with pt also acknowledging lack of awareness and control of BM since fall.     Recommendations for follow up therapy are one component of a multi-disciplinary discharge planning process, led by the attending physician.  Recommendations may be updated based on patient status, additional functional criteria and insurance authorization.  Follow Up Recommendations  Skilled nursing-short term rehab (<3 hours/day)     Assistance Recommended at Discharge Intermittent Supervision/Assistance  Equipment Recommendations  Wheelchair (measurements PT)    Recommendations for Other Services       Precautions / Restrictions Precautions Precautions: Fall;Back Precaution Booklet Issued: No Required Braces or Orthoses: Spinal Brace Spinal Brace: Thoracolumbosacral orthotic;Applied in sitting position     Mobility  Bed Mobility Overal bed mobility: Needs Assistance Bed Mobility: Rolling;Sidelying to Sit Rolling: Min assist Sidelying to sit: Min assist       General bed mobility comments: pt able to roll bil with min assist and rail for pericare. pt incontinent of stool and unaware which he reports is new since fall. Pt with assist to elevate trunk to rise from surface    Transfers Overall  transfer level: Needs assistance   Transfers: Sit to/from Stand Sit to Stand: Min guard           General transfer comment: cues for hand placement with guarding to rise from bed and lower to chair    Ambulation/Gait Ambulation/Gait assistance: Min assist Gait Distance (Feet): 20 Feet Assistive device: Rolling walker (2 wheels) Gait Pattern/deviations: Step-through pattern;Decreased stride length   Gait velocity interpretation: <1.8 ft/sec, indicate of risk for recurrent falls   General Gait Details: pt with cues for proximity to RW with good stability but fatigued quickly and only tolerating 20' of gait this session. Pt denied repeated gait attempts   Stairs             Wheelchair Mobility    Modified Rankin (Stroke Patients Only)       Balance Overall balance assessment: Needs assistance;History of Falls   Sitting balance-Leahy Scale: Fair Sitting balance - Comments: EOb without support   Standing balance support: Bilateral upper extremity supported Standing balance-Leahy Scale: Poor Standing balance comment: RW for standing and gait                            Cognition Arousal/Alertness: Awake/alert Behavior During Therapy: WFL for tasks assessed/performed Overall Cognitive Status: Impaired/Different from baseline                       Memory: Decreased recall of precautions                  Exercises General Exercises - Lower Extremity Short Arc Quad: AROM;Both;Seated;10 reps Hip  Flexion/Marching: AROM;Both;Seated;10 reps    General Comments        Pertinent Vitals/Pain Faces Pain Scale: Hurts little more Pain Location: low back Pain Descriptors / Indicators: Aching Pain Intervention(s): Limited activity within patient's tolerance;Monitored during session;Repositioned;Premedicated before session    Home Living                          Prior Function            PT Goals (current goals can now be  found in the care plan section) Progress towards PT goals: Progressing toward goals    Frequency    Min 3X/week      PT Plan Current plan remains appropriate    Co-evaluation              AM-PAC PT "6 Clicks" Mobility   Outcome Measure  Help needed turning from your back to your side while in a flat bed without using bedrails?: A Little Help needed moving from lying on your back to sitting on the side of a flat bed without using bedrails?: A Little Help needed moving to and from a bed to a chair (including a wheelchair)?: A Little Help needed standing up from a chair using your arms (e.g., wheelchair or bedside chair)?: A Little Help needed to walk in hospital room?: A Lot Help needed climbing 3-5 steps with a railing? : Total 6 Click Score: 15    End of Session Equipment Utilized During Treatment: Back brace Activity Tolerance: Patient limited by fatigue Patient left: in chair;with call bell/phone within reach;with chair alarm set Nurse Communication: Mobility status PT Visit Diagnosis: Other abnormalities of gait and mobility (R26.89);Muscle weakness (generalized) (M62.81)     Time: 5681-2751 PT Time Calculation (min) (ACUTE ONLY): 22 min  Charges:  $Therapeutic Activity: 8-22 mins                     Ammara Raj P, PT Acute Rehabilitation Services Pager: [phone] Office: [place] Drewes 11/30/2020, 12:47 PM

## 2020-11-30 NOTE — Progress Notes (Signed)
Family Medicine Teaching Service Daily Progress Note Intern Pager: [phone]  Patient name: Stanley Moore Medical record number: [id number] Date of birth: 1931-08-08 Age: 87 y.o. Gender: male  Primary Care Provider: Merwyn Katos Consultants: Cardiology, oncology, IR, palliative Code Status: DNR   Pt Overview and Major Events to Date:  11/18: Admitted, transfused PRBC 1 unit 11/21: Held Lasix 11/22: Urinary retention 11/23: Bone marrow biopsy 11/24: Blood transfusion PRBC 1 unit, Foley placement  Assessment and Plan: Stanley Moore is a 87 y.o. male who presented with  weakness and low back pain s/p fall and found to have multiple TP and 12th rib fractures. PMHx of HTN, HLD, T2DM, CKD 3B, persistent A. fib, HFrEF (25-30%), AV block, mitral regurg, CAD, NSTEMI (10/13/2020).  Pancytopenia WBC (!) 3.5 (3.8), Hb 8.4 (7.8), PLT 82 (79). Hb 9.0 s/p transfusion 1 unit PRBC 11/24. Continue to hold anticoagulants Pending bone marrow biopsy  CKD IV Cr (!) 2.54 from 2.34, this is likely patient's new baseline per cardiology. Received torsemide 1 mg x1 11/24  Urinary retention  BPH Foley day 1 in place due to consistent urinary retention. Tamsulosin 0.4 mg daily 30 minutes after food  CHF exacerbation with HFrEF 25-30% ejection fraction  history of left bundle branch block  moderate mitral regurge Current weight 86.2 kg. UOP 1.4 L. K+ 4.6, Cr (!) 2.54.  We will continue to monitor Strict I/Os and daily weights BMP  K+ >4, replete as needed   Persistent A. fib  transient V. Tach  sinus bradycardia We will continue to monitor. Amiodarone 100 mg daily  T2DM:  CBG 139 (IP CBG goal <180).  We will continue to monitor  Goals of care Due to current state, comorbidities which limit invasive evaluation, and patient's wishes.  Palliative care consult to discuss goals of care.  Currently patient is DNR.  Patient and family is agreeable, however the conversation  does need to be through patient's daughter as patient's wife has short-term memory concerns. Palliative care consulted, appreciate assistance and recommendations.   FEN/GI: Carb modified, Other (Comment)  PPx: Holding all anticoagulants Dispo:SNF pending palliative consult and heme-onc work-up.  Subjective:  [name] this AM was pleasant during encounter.  Voiced no complaints, stated that his pain is controlled currently.  Denied any acute concerns.  Objective: Temp:  [97.5 F (36.4 C)-100.1 F (37.8 C)] 98.8 F (37.1 C) (11/25 0600) Pulse Rate:  [56-67] 56 (11/25 0600) Resp:  [12-18] 16 (11/25 0600) BP: (102-115)/(44-52) 113/51 (11/25 0600) SpO2:  [94 %-99 %] 98 % (11/25 0600) Physical Exam Vitals and nursing note reviewed.  Constitutional:      General: He is not in acute distress.    Appearance: He is not ill-appearing or diaphoretic.  HENT:     Head: Normocephalic and atraumatic.  Cardiovascular:     Rate and Rhythm: Regular rhythm. Bradycardia present.     Heart sounds: No murmur heard.   No friction rub. No gallop.  Pulmonary:     Effort: Pulmonary effort is normal. No respiratory distress.     Breath sounds: No wheezing or rales.  Musculoskeletal:     Right lower leg: No edema.     Left lower leg: No edema.     Comments: Pitting edema posteriorly 1+ up to above the ankles laterally  Neurological:     Mental Status: He is alert.    Laboratory: Recent Labs  Lab 11/28/20 0255 11/29/20 0304 11/29/20 1629 11/30/20 0325  WBC 3.1* 3.8*  --  3.5*  HGB 8.3* 7.8* 9.0* 8.4*  HCT 26.3* 24.6* 28.1* 26.2*  PLT 85* 79*  --  82*   Recent Labs  Lab 11/23/20 1619 11/23/20 1631 11/24/20 0301 11/25/20 0420 11/27/20 0230 11/28/20 0255 11/29/20 0304 11/30/20 0325  NA 136   < > 136   < > 136 136 138 137  K 4.7   < > 4.8   < > 4.2 4.7 4.8 4.6  CL 115*   < > 112*   < > 112* 109 114* 111  CO2 15*  --  15*   < > 18* 17* 18* 19*  BUN 99*   < > 94*   < > 80* 78* 68*  70*  CREATININE 2.68*   < > 2.66*   < > 2.33* 2.36* 2.34* 2.54*  CALCIUM 8.1*  --  8.2*   < > 8.2* 8.3* 8.3* 8.3*  PROT 6.1*  --  6.0*  --  5.7*  --   --   --   BILITOT 0.7  --  1.6*  --  1.1  --   --   --   ALKPHOS 48  --  49  --  45  --   --   --   ALT 18  --  17  --  16  --   --   --   AST 13*  --  38  --  21  --   --   --   GLUCOSE 180*   < > 162*   < > 139* 146* 140* 139*   < > = values in this interval not displayed.   Results for orders placed or performed during the hospital encounter of 11/23/20 (from the past 24 hour(s))  Hemoglobin and hematocrit, blood     Status: Abnormal   Collection Time: 11/29/20  4:29 PM  Result Value Ref Range   Hemoglobin 9.0 (L) 13.0 - 17.0 g/dL   HCT 28.1 (L) 39.0 - 52.0 %  CBC     Status: Abnormal   Collection Time: 11/30/20  3:25 AM  Result Value Ref Range   WBC 3.5 (L) 4.0 - 10.5 K/uL   RBC 2.34 (L) 4.22 - 5.81 MIL/uL   Hemoglobin 8.4 (L) 13.0 - 17.0 g/dL   HCT 26.2 (L) 39.0 - 52.0 %   MCV 112.0 (H) 80.0 - 100.0 fL   MCH 35.9 (H) 26.0 - 34.0 pg   MCHC 32.1 30.0 - 36.0 g/dL   RDW 22.1 (H) 11.5 - 15.5 %   Platelets 82 (L) 150 - 400 K/uL   nRBC 0.0 0.0 - 0.2 %  Basic metabolic panel     Status: Abnormal   Collection Time: 11/30/20  3:25 AM  Result Value Ref Range   Sodium 137 135 - 145 mmol/L   Potassium 4.6 3.5 - 5.1 mmol/L   Chloride 111 98 - 111 mmol/L   CO2 19 (L) 22 - 32 mmol/L   Glucose, Bld 139 (H) 70 - 99 mg/dL   BUN 70 (H) 8 - 23 mg/dL   Creatinine, Ser 2.54 (H) 0.61 - 1.24 mg/dL   Calcium 8.3 (L) 8.9 - 10.3 mg/dL   GFR, Estimated 24 (L) >60 mL/min   Anion gap 7 5 - 15    Imaging/Diagnostic Tests: No results found.   Merrily Brittle, DO 11/30/2020, 6:28 AM PGY-1, [place] Intern pager: [phone], text pages welcome

## 2020-12-01 DIAGNOSIS — W19XXXD Unspecified fall, subsequent encounter: Secondary | ICD-10-CM | POA: Diagnosis not present

## 2020-12-01 DIAGNOSIS — I5023 Acute on chronic systolic (congestive) heart failure: Secondary | ICD-10-CM | POA: Diagnosis present

## 2020-12-01 DIAGNOSIS — N179 Acute kidney failure, unspecified: Secondary | ICD-10-CM | POA: Diagnosis not present

## 2020-12-01 DIAGNOSIS — D61818 Other pancytopenia: Secondary | ICD-10-CM | POA: Diagnosis not present

## 2020-12-01 DIAGNOSIS — Z7189 Other specified counseling: Secondary | ICD-10-CM

## 2020-12-01 DIAGNOSIS — S32009D Unspecified fracture of unspecified lumbar vertebra, subsequent encounter for fracture with routine healing: Secondary | ICD-10-CM | POA: Diagnosis not present

## 2020-12-01 DIAGNOSIS — S32009A Unspecified fracture of unspecified lumbar vertebra, initial encounter for closed fracture: Secondary | ICD-10-CM | POA: Diagnosis not present

## 2020-12-01 DIAGNOSIS — R54 Age-related physical debility: Secondary | ICD-10-CM | POA: Diagnosis present

## 2020-12-01 LAB — CBC
HCT: 24.9 % — ABNORMAL LOW (ref 39.0–52.0)
Hemoglobin: 8.2 g/dL — ABNORMAL LOW (ref 13.0–17.0)
MCH: 36.6 pg — ABNORMAL HIGH (ref 26.0–34.0)
MCHC: 32.9 g/dL (ref 30.0–36.0)
MCV: 111.2 fL — ABNORMAL HIGH (ref 80.0–100.0)
Platelets: 80 10*3/uL — ABNORMAL LOW (ref 150–400)
RBC: 2.24 MIL/uL — ABNORMAL LOW (ref 4.22–5.81)
RDW: 21.4 % — ABNORMAL HIGH (ref 11.5–15.5)
WBC: 3.2 10*3/uL — ABNORMAL LOW (ref 4.0–10.5)
nRBC: 0 % (ref 0.0–0.2)

## 2020-12-01 LAB — BASIC METABOLIC PANEL
Anion gap: 8 (ref 5–15)
BUN: 67 mg/dL — ABNORMAL HIGH (ref 8–23)
CO2: 17 mmol/L — ABNORMAL LOW (ref 22–32)
Calcium: 8.2 mg/dL — ABNORMAL LOW (ref 8.9–10.3)
Chloride: 109 mmol/L (ref 98–111)
Creatinine, Ser: 2.42 mg/dL — ABNORMAL HIGH (ref 0.61–1.24)
GFR, Estimated: 25 mL/min — ABNORMAL LOW (ref >60)
Glucose, Bld: 127 mg/dL — ABNORMAL HIGH (ref 70–99)
Potassium: 4.5 mmol/L (ref 3.5–5.1)
Sodium: 134 mmol/L — ABNORMAL LOW (ref 135–145)

## 2020-12-01 LAB — OCCULT BLOOD X 1 CARD TO LAB, STOOL: Fecal Occult Bld: POSITIVE — AB

## 2020-12-01 MED ORDER — POLYETHYLENE GLYCOL 3350 17 G PO PACK: 17.0000 g | PACK | Freq: Every day | ORAL | Status: DC | PRN

## 2020-12-01 NOTE — Progress Notes (Signed)
Family Medicine Teaching Service Daily Progress Note Intern Pager: [phone]  Patient name: Stanley Moore Medical record number: [id number] Date of birth: 07-24-1931 Age: 87 y.o. Gender: male  Primary Care Provider: Heywood Bene, PA-C Consultants: Cardiology, Oncology, IR s/o, Palliative Code Status: DNR  Pt Overview and Major Events to Date:  11/18: Admitted, transfused PRBC 1 unit 11/21: Held Lasix 11/22: Urinary retention 11/23: Bone marrow biopsy 11/24: Blood transfusion PRBC 1 unit, Foley placement  Assessment and Plan:  Stanley Moore is a 87 y.o. male who presented with  weakness and low back pain s/p fall and found to have multiple TP and 12th rib fractures. PMHx of HTN, HLD, T2DM, CKD 3B, persistent A. fib, HFrEF (25-30%), AV block, mitral regurg, CAD, NSTEMI (10/13/2020).   Pancytopenia WBC 3.2 (3.5, 3.8), Hgb 8.2 (8.4, 9.0), PLT 80 (82, 79) Continue to monitor, transfuse if Hg less than 8, patient received transfusion on 11/29/20 & 11/23/20. Patient Hgb continues to fall, will search for etiology. -FOBT -Hold anticoagulants -F/u bone marrow biopsy  CKD IV Cr 2.42 today down from 2.54 yesterday. Will continue to follow -AM BMP   Urinary retention  BPH Foley day 2, 2/2 consistent urinary retention -Continue Tamsulosin 0.4 mg daily  CHF exacerbation with HFrEF 25-30% ejection fraction  history of left bundle branch block  moderate mitral regurge UOP 750 yesterday, K 4.5 (4.6, 4.8), Cr 2.42 (2.54, 2.34). Will continue to monitor -Strict I/O's and daily weights -BMP   Persistent A. fib  transient V. Tach  sinus bradycardia Continue to monitor, HR in 50-60's -Continue Amiodarone 100 mg daily  DM2 CBG was 127 yesterday, stable -Continue to monitor  Goals of Care Palliative following, appreciate recs  FEN/GI: Carb modified PPx: Holdin anticoagulants Dispo:SNF  Pending palliative consult and heme-onc work up .   Subjective:   Patient laying comfortably in  bed, with no complaints of pain. Says he tires easily when moving around.   Objective: Temp:  [98.4 F (36.9 C)-99.5 F (37.5 C)] 99 F (37.2 C) (11/26 0556) Pulse Rate:  [53-61] 56 (11/26 0556) Resp:  [12-18] 12 (11/26 0556) BP: (112-132)/(52-94) 117/52 (11/26 0556) SpO2:  [94 %-98 %] 97 % (11/26 0556) Physical Exam: General: Frail, elderly, white male, NASD Cardiovascular: Distant heart sounds, RRR, peripheral pulses intact and patient warm Respiratory: CTABL Abdomen: Soft, NTTP Extremities: Moving all extremities independently, minimal pedal edema at level of ankles bilaterally.  Laboratory: Recent Labs  Lab 11/29/20 0304 11/29/20 1629 11/30/20 0325 12/01/20 0312  WBC 3.8*  --  3.5* 3.2*  HGB 7.8* 9.0* 8.4* 8.2*  HCT 24.6* 28.1* 26.2* 24.9*  PLT 79*  --  82* 80*   Recent Labs  Lab 11/27/20 0230 11/28/20 0255 11/29/20 0304 11/30/20 0325 12/01/20 0312  NA 136   < > 138 137 134*  K 4.2   < > 4.8 4.6 4.5  CL 112*   < > 114* 111 109  CO2 18*   < > 18* 19* 17*  BUN 80*   < > 68* 70* 67*  CREATININE 2.33*   < > 2.34* 2.54* 2.42*  CALCIUM 8.2*   < > 8.3* 8.3* 8.2*  PROT 5.7*  --   --   --   --   BILITOT 1.1  --   --   --   --   ALKPHOS 45  --   --   --   --   ALT 16  --   --   --   --  AST 21  --   --   --   --   GLUCOSE 139*   < > 140* 139* 127*   < > = values in this interval not displayed.      Imaging/Diagnostic Tests:   Holley Bouche, MD 12/01/2020, 8:25 AM PGY-1, [place] Intern pager: [phone], text pages welcome

## 2020-12-01 NOTE — Consult Note (Signed)
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 He was found to have multiple transverse process fractures and left 12th rib fracture. Pancytopenia on labs is being worked up by [facility] biopsy, results pending.  Clinical Assessment/Goals of Care: I have reviewed medical records including EPIC notes, labs and imaging, received report from bedside RN, assessed the patient.    I met with Mr. Usrey to further discuss diagnosis prognosis, GOC, EOL wishes, disposition and options. He deferred to his daughter and states she has his paperwork. I spoke by phone to his daughter Debbs Hammett. We scheduled a time to meet in person. We meet in the waiting room on the unit while her mother was visiting her dad.   I introduced Palliative Medicine as specialized medical care for people living with serious illness. It focuses on providing relief from the symptoms and stress of a serious illness. The goal is to improve quality of life for both the patient and the family.  A detailed discussion was had today regarding advanced directives.  Concepts specific to code status, artifical feeding and hydration, continued IV antibiotics and rehospitalization was had.  The difference between a aggressive medical intervention path  and a palliative comfort care path for this patient at this time was had. Values and goals of care important to patient and family were attempted to be elicited.  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 He and his wife  have been together for 43 years. They had 5 children, 3 are living. The wife has short term memory loss. Daughter Debbs is in [place], one son is in [place] and one in  [place]. The son in [place] is dealing with his Father-in-law being in the hospital in [place] at this time as well.  The [facility] for rehabilitation to increase strength and stamina upon  discharge but they are awaiting approval and a bed availability.  The family has plans to evaluate assisted living environments at [facility] on Tuesday.  The goal is for patient and his wife to be at [facility] together.   Further plan is a discussion tomorrow with patient, daughter and son-in-law to further define goals. Daughter is going to look for advanced directive paperwork at his house before returning to hospital tomorrow.   Discussed the importance of continued conversation with family and their  medical providers regarding overall plan of care and treatment options, ensuring decisions are within the context of the patients values and GOCs.  Provided Debbs Hammett  "Hard Choices for Loving People" booklet.    Decision Maker: Patient, daughter, Debbs Hammett if he cannot make decisions.  SUMMARY OF RECOMMENDATIONS    Code Status/Advance Care Planning:  DNAR/DNI  Symptom Management:  Pain: acetaminophen prn, oxycodone for moderate to severe pain prn, PT for mobility   Palliative Prophylaxis:  Bowel regimen- senna prn, polyethylene glycol prn     Psycho-social/Spiritual:  Desire for further Chaplaincy support: Yes, consult placed Additional Recommendations: Plan for additional conversation on 11/27 once daughter located MPOA    Prognosis: Patient  has multiple co-morbidities which limits invasive evaluation.   Discharge Planning: SNF for [facility]  with plan for transition to Assisted Living with wife afterward.     Vitals with BMI 12/01/2020 11/30/2020 11/30/2020  Height - - -  Weight - - -  BMI - - -  Systolic 051 102 111  Diastolic 52 94 59  Pulse 56 53 54    PPS: 50%     Thank you for the  opportunity to participate in the care of this patient and family. We will meet with them again tomorrow for continued goals of care conversation.  Time:7:30-8:00, 9:10-10:10 Total Time: 90 minutes Greater than 50%  of this time was spent counseling and coordinating care related to the above assessment and plan.  Lindell Spar, NP [facility] Palliative Medicine Team Team Cell Phone: [phone] Please utilize secure chat with additional questions, if there is no response within 30 minutes please call the above phone number  Palliative Medicine Team providers are available by phone from 7am to 7pm daily and can be reached through the team cell phone.  Should this patient require assistance outside of these hours, please call the patient's attending physician.

## 2020-12-01 NOTE — Progress Notes (Signed)
Physical Therapy Treatment Patient Details Name: REBECCA MOTTA MRN: [id number] DOB: Jul 06, 1931 Today's Date: 12/01/2020   History of Present Illness 87 y.o. male presents to [facility] hospital on 11/23/2020 after multiple falls. PT found to have L 12th rib fx as well as L1-3 TP fxs and L1 corner fx of vertebral body. Hgb 7.4 on admission. PMH includes HTN, HLD, T2DM, CKD 3b, persistent A. 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    PT Comments    Pt reports moderate back pain when moving today, but agreeable to OOB mobility. Pt requiring min physical assist for mobility at this time, and requires significant safety and form cues throughout mobility. Pt with bilat LE buckling during gait, pt stating he feels weaker today vs last session. PT educated pt on the importance of frequent repositioning off off buttocks given sacral wound and frequent fecal incontinence, PT positioning pt in semi-sidelying at end of session. PT to continue to follow.     Recommendations for follow up therapy are one component of a multi-disciplinary discharge planning process, led by the attending physician.  Recommendations may be updated based on patient status, additional functional criteria and insurance authorization.  Follow Up Recommendations  Skilled nursing-short term rehab (<3 hours/day)     Assistance Recommended at Discharge Frequent or constant Supervision/Assistance  Equipment Recommendations  Wheelchair (measurements PT);Wheelchair cushion (measurements PT)    Recommendations for Other Services       Precautions / Restrictions Precautions Precautions: Fall;Back Precaution Booklet Issued: No Precaution Comments: fecal incontinence, sacral wound Required Braces or Orthoses: Spinal Brace Spinal Brace: Thoracolumbosacral orthotic;Applied in sitting position Restrictions Weight Bearing Restrictions: No     Mobility  Bed Mobility Overal bed mobility:  Needs Assistance Bed Mobility: Rolling;Sidelying to Sit;Sit to Sidelying Rolling: Min assist Sidelying to sit: Min assist     Sit to sidelying: Min assist General bed mobility comments: assist for log roll technique for LE and truncal elevation/lowering, positioning in bed.    Transfers Overall transfer level: Needs assistance   Transfers: Sit to/from Stand Sit to Stand: Min assist;From elevated surface           General transfer comment: light power up and steadying assist. STS x2, from EOB.    Ambulation/Gait Ambulation/Gait assistance: Min assist Gait Distance (Feet): 20 Feet Assistive device: Rolling walker (2 wheels) Gait Pattern/deviations: Step-through pattern;Decreased stride length;Trunk flexed Gait velocity: decr     General Gait Details: Cues for upright posture, placement in RW, taking his time for safety as pt moves quickly and unsteadily at times. bilat min LE buckling during mobility. assist to steady, guide pt and RW.   Stairs             Wheelchair Mobility    Modified Rankin (Stroke Patients Only)       Balance Overall balance assessment: Needs assistance;History of Falls   Sitting balance-Leahy Scale: Fair Sitting balance - Comments: EOb without support   Standing balance support: Bilateral upper extremity supported Standing balance-Leahy Scale: Poor Standing balance comment: RW for standing and gait                            Cognition Arousal/Alertness: Awake/alert Behavior During Therapy: WFL for tasks assessed/performed Overall Cognitive Status: Impaired/Different from baseline Area of Impairment: Memory;Following commands;Safety/judgement                     Memory: Decreased recall of  precautions Following Commands: Follows one step commands consistently Safety/Judgement: Decreased awareness of safety     General Comments: cues for slowing down mobility for safety multiple times, requires cues for back  precautions        Exercises      General Comments General comments (skin integrity, edema, etc.): pt soiled in stool upon PT arrival to room, requires pericare assist and multiple rolling bouts for removal and replacement of brief. sacral pad removed due to soiled in stool,, RN notified and pt placed in semi R sidelying for buttocks pressure relief.      Pertinent Vitals/Pain Pain Assessment: Faces Faces Pain Scale: Hurts little more Pain Location: low back, buttocks (wound) Pain Descriptors / Indicators: Sore;Discomfort;Guarding Pain Intervention(s): Limited activity within patient's tolerance;Monitored during session;Repositioned;Premedicated before session    Home Living                          Prior Function            PT Goals (current goals can now be found in the care plan section) Acute Rehab PT Goals Patient Stated Goal: to return to independent mobility and stop falling PT Goal Formulation: With patient/family Time For Goal Achievement: 12/08/20 Potential to Achieve Goals: Fair Progress towards PT goals: Progressing toward goals    Frequency    Min 3X/week      PT Plan Current plan remains appropriate    Co-evaluation              AM-PAC PT "6 Clicks" Mobility   Outcome Measure  Help needed turning from your back to your side while in a flat bed without using bedrails?: A Little Help needed moving from lying on your back to sitting on the side of a flat bed without using bedrails?: A Little Help needed moving to and from a bed to a chair (including a wheelchair)?: A Lot (cuing) Help needed standing up from a chair using your arms (e.g., wheelchair or bedside chair)?: A Little Help needed to walk in hospital room?: A Lot Help needed climbing 3-5 steps with a railing? : Total 6 Click Score: 14    End of Session Equipment Utilized During Treatment: Back brace Activity Tolerance: Patient limited by fatigue Patient left: with call  bell/phone within reach;in bed;with bed alarm set;Other (comment) (pt declines chair due to buttocks pain) Nurse Communication: Mobility status PT Visit Diagnosis: Other abnormalities of gait and mobility (R26.89);Muscle weakness (generalized) (M62.81)     Time: 1438-8875 PT Time Calculation (min) (ACUTE ONLY): 24 min  Charges:  $Therapeutic Activity: 8-22 mins $Self Care/Home Management: 8-22                    Stacie Glaze, PT DPT Acute Rehabilitation Services Pager [phone]  Office [phone]    Roxine Caddy E Ruffin Pyo 12/01/2020, 12:14 PM

## 2020-12-01 NOTE — Progress Notes (Signed)
FPTS Brief Progress Note  S: Sleeping. Did not awaken. Discussed with RN who reports that he had a few loose stools and intermittent formed stools in between. He refused MiraLAX, understandably. Also states that he does not wish to be awoken for scheduled Tylenol dose (also understandable).   O: BP (!) 124/94 (BP Location: Left Arm)   Pulse (!) 53   Temp 99.5 F (37.5 C) (Oral)   Resp 16   Ht 5\' 11"  (1.803 m)   Wt 86.2 kg   SpO2 95%   BMI 26.50 kg/m   Gen: seen sleeping in bed, NAD  A/P: Pancytopenia: stable Still awaiting bone marrow biopsies.   Transverse Fractures s/p fall -Adjusted pain regimen so that Tylenol is only given while awake, per patient preference.  Loose Stools -Changed MiraLAX from BID to daily PRN for mild and moderate constipation   - Orders reviewed. Labs for AM not ordered, which was adjusted as needed.   Remainder of plan per day team note.   Sharion Settler, DO 12/01/2020, 1:06 AM PGY-2, [facility] Night Resident  Please page [phone] with questions.

## 2020-12-02 DIAGNOSIS — Z7189 Other specified counseling: Secondary | ICD-10-CM | POA: Diagnosis not present

## 2020-12-02 DIAGNOSIS — R6889 Other general symptoms and signs: Secondary | ICD-10-CM | POA: Diagnosis not present

## 2020-12-02 DIAGNOSIS — D539 Nutritional anemia, unspecified: Secondary | ICD-10-CM | POA: Diagnosis not present

## 2020-12-02 DIAGNOSIS — S32009A Unspecified fracture of unspecified lumbar vertebra, initial encounter for closed fracture: Secondary | ICD-10-CM | POA: Diagnosis not present

## 2020-12-02 DIAGNOSIS — S32009D Unspecified fracture of unspecified lumbar vertebra, subsequent encounter for fracture with routine healing: Secondary | ICD-10-CM | POA: Diagnosis not present

## 2020-12-02 DIAGNOSIS — Z7409 Other reduced mobility: Secondary | ICD-10-CM | POA: Diagnosis present

## 2020-12-02 DIAGNOSIS — K921 Melena: Secondary | ICD-10-CM | POA: Diagnosis not present

## 2020-12-02 DIAGNOSIS — R195 Other fecal abnormalities: Secondary | ICD-10-CM

## 2020-12-02 DIAGNOSIS — N179 Acute kidney failure, unspecified: Secondary | ICD-10-CM | POA: Diagnosis not present

## 2020-12-02 LAB — IRON AND TIBC
Iron: 93 ug/dL (ref 45–182)
Saturation Ratios: 44 % — ABNORMAL HIGH (ref 17.9–39.5)
TIBC: 211 ug/dL — ABNORMAL LOW (ref 250–450)
UIBC: 118 ug/dL

## 2020-12-02 LAB — CBC
HCT: 25.7 % — ABNORMAL LOW (ref 39.0–52.0)
Hemoglobin: 8.2 g/dL — ABNORMAL LOW (ref 13.0–17.0)
MCH: 36.3 pg — ABNORMAL HIGH (ref 26.0–34.0)
MCHC: 31.9 g/dL (ref 30.0–36.0)
MCV: 113.7 fL — ABNORMAL HIGH (ref 80.0–100.0)
Platelets: 79 10*3/uL — ABNORMAL LOW (ref 150–400)
RBC: 2.26 MIL/uL — ABNORMAL LOW (ref 4.22–5.81)
RDW: 21.2 % — ABNORMAL HIGH (ref 11.5–15.5)
WBC: 3 10*3/uL — ABNORMAL LOW (ref 4.0–10.5)
nRBC: 0 % (ref 0.0–0.2)

## 2020-12-02 LAB — FERRITIN: Ferritin: 828 ng/mL — ABNORMAL HIGH (ref 24–336)

## 2020-12-02 MED ORDER — PANTOPRAZOLE SODIUM 40 MG PO TBEC
40.0000 mg | DELAYED_RELEASE_TABLET | Freq: Every day | ORAL | Status: DC
  Administered 2020-12-02 – 2020-12-04 (×3): 40 mg via ORAL
  Filled 2020-12-02 (×3): qty 1

## 2020-12-02 MED ORDER — TAMSULOSIN HCL 0.4 MG PO CAPS
0.8000 mg | ORAL_CAPSULE | Freq: Every day | ORAL | Status: DC
  Administered 2020-12-03 – 2020-12-04 (×2): 0.8 mg via ORAL
  Filled 2020-12-02 (×2): qty 2

## 2020-12-02 NOTE — Progress Notes (Signed)
FPTS Brief Progress Note  S:Patient awake. Comfortable, no complaints. Asks for ice water.   O: BP 100/76 (BP Location: Left Arm)   Pulse (!) 54   Temp 97.8 F (36.6 C) (Oral)   Resp 18   Ht 5\' 11"  (1.803 m)   Wt 86.4 kg   SpO2 98%   BMI 26.57 kg/m    A/P:  Pancytopenia Transfusion threshold Hgb 8. FOBT positive Saturday evening.  - f/u bone marrow bx results - GI consult in the AM   Urinary retention  BPH UOP last 24 hours 950 mL via foley catheter. No changes to meds at this time.    Persistent A. fib  transient V. Tach  sinus bradycardia HR last evening 54-56. Will continue to monitor.    DM2 No CBG collected last 24 hours.  - new order for CBG q8h    - Orders reviewed. Labs for AM ordered, which was adjusted as needed.    Ezequiel Essex, MD 12/02/2020, 4:22 AM PGY-2, [facility] Night Resident  Please page [phone] with questions.

## 2020-12-02 NOTE — Progress Notes (Signed)
   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 He was found to have multiple transverse process fractures and left 12th rib fracture. Pancytopenia macrocytic anemia on labs is being worked up by BM biopsy, results pending. Gi is consulting for guaiac positive stool with no overt bleeding.     Today's Discussion Advance Directives:  Mr. Sahlin and his daughter Debbs had reviewed the MOST and MPOA paperwork that I reviewed with her yesterday. MOST form completed, uploaded to media and placed on chart. He wishes DNR, limited interventions in no pulse or breathing,antibiotics-yes, fluids for a defined period, feeding tube-no.  Advanced directive- he would like his daughter to be is MPOA. Paperwork is on nightstand and will require notary.    We discussed new findings and GI consult and recommendation for endoscopy. He will wait for bone marrow biopsy results before making a decision.   Discussed the importance of continued conversation with family and their  medical providers regarding overall plan of care and treatment options, ensuring decisions are within the context of the patients values and GOCs.  MPOA complete. Advanced Medical Directive paperwork completed and at beside for notary. Questions and concerns addressed.   Objective Assessment: Vital Signs Vitals:   12/02/20 0511 12/02/20 0957  BP: 106/73 (!) 116/40  Pulse: (!) 54 (!) 53  Resp: 18 17  Temp: 98.7 F (37.1 C) 97.7 F (36.5 C)  SpO2: 98% 99%    Intake/Output Summary (Last 24 hours) at 12/02/2020 1328 Last data filed at 12/02/2020 0800 Gross per 24 hour  Intake 660 ml  Output 1400 ml  Net -740 ml   Last Weight  Most recent update:  12/02/2020  5:20 AM    Weight  85.4 kg (188 lb 4.4 oz)            SUMMARY OF RECOMMENDATIONS    Code Status/Advance Care Planning:   DNAR/DNI   Symptom Management:  Pain: acetaminophen prn, oxycodone for moderate to severe pain prn, PT for mobility   Palliative Prophylaxis:  Bowel regimen- senna prn, polyethylene glycol prn   Psycho-social/Spiritual:  Desire for further Chaplaincy support: Yes Additional Recommendations: Palliative medicine to follow for continued conversations when results are back.    Prognosis: Patient  has multiple co-morbidities which limits invasive evaluation.    Discharge Planning: SNF for [facility] with plan for transition to Assisted Living with wife afterward.   Time In:10;25 Time Out: 10:40 Time Spent:15 minutes Greater than 50% of the time was spent in counseling and coordination of care ______________________________________________________________________________________ Lindell Spar, NP [facility] Team Team Cell Phone: [phone] Please utilize secure chat with additional questions, if there is no response within 30 minutes please call the above phone number  Palliative Medicine Team providers are available by phone from 7am to 7pm daily and can be reached through the team cell phone.  Should this patient require assistance outside of these hours, please call the patient's attending physician.

## 2020-12-02 NOTE — Progress Notes (Signed)
Family Medicine Teaching Service Daily Progress Note Intern Pager: [phone]  Patient name: Stanley Moore Medical record number: [id number] Date of birth: [birth year] Age: 87 y.o. Gender: male  Primary Care Provider: Heywood Bene, PA-C Consultants: Cardiology, oncology, palliative, IR (s/o) Code Status: DNR  Pt Overview and Major Events to Date:  11/18: Admitted, transfused PRBC 1 unit 11/23: Bone marrow biopsy 11/24: Blood transfusion PRBC 1 unit, Foley placement  Assessment and Plan: Stanley Moore is a 87 y.o. male who presented with  weakness and low back pain s/p fall and found to have multiple TP and 12th rib fractures. PMHx of HTN, HLD, T2DM, CKD 3B, persistent A. fib, HFrEF (25-30%), AV block, mitral regurg, CAD, NSTEMI (10/13/2020).   Pancytopenia WBC 3.0 (3.8>3.5>3.2), Hgb 8.2 (8.4>8.2>8.2), PLT 79 (79>82>80) FOBT positive overnight. Transfusion threshold of 8 due to cardiac history.  - consult GI - start protonix - f/u results of bone marrow bx (pending) - continue to hold anticoagulants   CKD IV Baseline Cr closer to 1.5-2. AM Cr 2.42. May represent new baseline.  - AM BMPs   Urinary retention  BPH UOP through foley last 24 hours 1,400 mL. Continue Tamsulosin 0.4 mg daily   CHF exacerbation  HFrEF 25-30%  Hx LBBB  mod MR I/O's net zero in last 24 hours. AM weight down 1 kg from yesterday.  - continue I/Os - daily wt - BMP    Persistent A. fib  transient V. Tach  sinus bradycardia HR at goal. Continue amiodarone 100 mg daily.    T2DM Last glucose recorded 11/25, was wnl. All glucose values have been within normal limits.  - monitor glucose through daily BMP - d/c POCT CBG checks for now   Goals of Care Expect meeting with family today, 11/27. Looking forward to implementing fruits of that conversation, whatever they may be.    FEN/GI: Regular diet PPx: SCDs Dispo:SNF pending clinical improvement . Barriers include continued  medical work up.   Subjective:  Patient awake and in bed. Tech at bedside. Just had BM without gross blood. No complaints. Discussed GI consult today.   Objective: Temp:  [97.8 F (36.6 C)-98.7 F (37.1 C)] 98.7 F (37.1 C) (11/27 0511) Pulse Rate:  [54-63] 54 (11/27 0511) Resp:  [17-19] 18 (11/27 0511) BP: (100-115)/(51-76) 106/73 (11/27 0511) SpO2:  [95 %-98 %] 98 % (11/27 0511) Weight:  [85.4 kg-86.4 kg] 85.4 kg (11/27 0500) Physical Exam: General: awake, alert, pleasant, no acute distress Cardiovascular: RRR, no murmur but distant heart sounds Respiratory: CTAB Abdomen: soft, non tender  Laboratory: Recent Labs  Lab 11/30/20 0325 12/01/20 0312 12/02/20 0326  WBC 3.5* 3.2* 3.0*  HGB 8.4* 8.2* 8.2*  HCT 26.2* 24.9* 25.7*  PLT 82* 80* 79*   Recent Labs  Lab 11/27/20 0230 11/28/20 0255 11/29/20 0304 11/30/20 0325 12/01/20 0312  NA 136   < > 138 137 134*  K 4.2   < > 4.8 4.6 4.5  CL 112*   < > 114* 111 109  CO2 18*   < > 18* 19* 17*  BUN 80*   < > 68* 70* 67*  CREATININE 2.33*   < > 2.34* 2.54* 2.42*  CALCIUM 8.2*   < > 8.3* 8.3* 8.2*  PROT 5.7*  --   --   --   --   BILITOT 1.1  --   --   --   --   ALKPHOS 45  --   --   --   --  ALT 16  --   --   --   --   AST 21  --   --   --   --   GLUCOSE 139*   < > 140* 139* 127*   < > = values in this interval not displayed.    Imaging/Diagnostic Tests: None last 24 hours.   Ezequiel Essex, MD 12/02/2020, 6:48 AM PGY-2, [place] Intern pager: [phone], text pages welcome

## 2020-12-02 NOTE — Consult Note (Signed)
Referring Provider: Family Medicine Teaching Service PCP: Merwyn Katos  Gastroenterologist: Althia Forts Reason for consultation:   anemia                ASSESSMENT / PLAN   #  87 year old male with multiple medical problems, worsening of chronic anemia and FOBT+.  He has actually has pancytopenia. Baseline hemoglobin 11-12, now in 7 range on Eliquis and Plavix at home. No overt bleeding except ? Very occasional dark BM.    Previously macrocytic with normal B12 and folate levels ( Care Everywhere)  Anemia probably multifactorial ( ? Occult GI bleed loss, CKD,  ? Advanced liver disease,  bone marrow suppression)   --I started the EGD, +/- colonoscopy conversation with patient. His wife and daughter were also present. He will likely need to resume Eliquis at some point --His hgb has improved to 8.6 post 2 uPRBC --Plavix and Eliquis on hold since admission 11/18  # Nodular liver on non-contrast CT scan, ? Cirrhosis. No evidence for portal HTN. No splenomegaly . No ascites. INR elevated at 1.4 but Eliquis can have that effect.   # Additional medical history listed below.   HISTORY OF PRESENT ILLNESS                                                                                                                         Chief Complaint: anemia  WIL SLAPE is a 87 y.o. male with a past medical history significant for hypertension, hyperlipidemia, DM, CKD 4, chronic anemia , atrial fibrillation on Eliquis, chronic systolic heart failure, severe mitral regurgitation , CAD/NSTEMI October 2022 see PMH for any additional medical problems.   Patient was admitted several days ago after a fall at home at which time he sustained several spine and rib fractures.  While hospitalized patient was found to be anemic with Hemoccult positive stools.  Reviewed some labs in care everywhere.  Baseline hemoglobin is around 11-10 range.  In October 2022 his hemoglobin was 9.4, it has declined to  7.4 this admission.  He has so far received 2 units of blood with improvement in hemoglobin to 8.2.  No iron studies obtained.  Noncontrast CT scan raises concern for possible mild cirrhosis.  He has a right adrenal mass, possibly adenoma.  Multiple transverse fractures and also fracture of L1 and the 12th rib   Patient says that he that his blood works are normal.  He may on a very rare occasion of a black stool.  He has no abdominal pain, nausea nor vomiting.  Denies NSAID use.  He has never undergone colon cancer screening.  No family history of colon cancer.   Past Medical History:  Diagnosis Date   CHF (congestive heart failure) (HCC)    CRI (chronic renal insufficiency), stage 3 (moderate) (HCC)    Diabetes mellitus without complication (HCC)    Hyperlipidemia    Hypertension    LBBB (left bundle branch block)    Moderate  mitral regurgitation    Paroxysmal atrial fibrillation [facility])     Past Surgical History:  Procedure Laterality Date   appendicitis     CARDIOVERSION N/A 06/27/2019   Procedure: CARDIOVERSION;  Surgeon: Elouise Munroe, MD;  Location: [facility] ENDOSCOPY;  Service: Cardiovascular;  Laterality: N/A;   MOLE REMOVAL      Prior to Admission medications   Medication Sig Start Date End Date Taking? Authorizing Provider  amiodarone (PACERONE) 100 MG tablet TAKE 1 TABLET BY MOUTH EVERY DAY 10/01/20  Yes O'Neal, Cassie Freer, MD  apixaban (ELIQUIS) 2.5 MG TABS tablet TAKE 1 TABLET BY MOUTH TWICE A DAY 09/12/20  Yes O'Neal, Cassie Freer, MD  carvedilol (COREG) 12.5 MG tablet Take 1 tablet (12.5 mg total) by mouth 2 (two) times daily. 11/01/20  Yes O'Neal, Cassie Freer, MD  clopidogrel (PLAVIX) 75 MG tablet Take 1 tablet (75 mg total) by mouth daily. 11/01/20  Yes O'Neal, Cassie Freer, MD  furosemide (LASIX) 20 MG tablet TAKE 1 TABLET BY MOUTH EVERY DAY Patient taking differently: Take 20 mg by mouth daily. 09/06/20  Yes O'Neal, Cassie Freer, MD  hydrALAZINE (APRESOLINE) 25 MG  tablet Take 1 tablet (25 mg total) by mouth 3 (three) times daily. 11/01/20 01/30/21 Yes O'Neal, Cassie Freer, MD  isosorbide mononitrate (IMDUR) 30 MG 24 hr tablet Take 1 tablet (30 mg total) by mouth daily. 11/01/20  Yes O'Neal, Cassie Freer, MD  JARDIANCE 10 MG TABS tablet TAKE 1 TABLET BY MOUTH EVERY DAY WITH BREAKFAST Patient taking differently: Take 10 mg by mouth daily. 04/09/20  Yes O'Neal, Cassie Freer, MD  nitroGLYCERIN (NITROSTAT) 0.4 MG SL tablet Place 1 tablet (0.4 mg total) under the tongue every 5 (five) minutes x 3 doses as needed for chest pain. 10/14/20  Yes Barrett, Evelene Croon, PA-C  simvastatin (ZOCOR) 20 MG tablet Take 1 tablet (20 mg total) by mouth every evening. 11/01/20  Yes O'Neal, Cassie Freer, MD  spironolactone (ALDACTONE) 25 MG tablet Take 25 mg by mouth daily.   Yes [provider]    Current Facility-Administered Medications  Medication Dose Route Frequency Provider Last Rate Last Admin   acetaminophen (TYLENOL) tablet 650 mg  650 mg Oral Q6H Espinoza, Alejandra, DO   650 mg at 12/02/20 0347   Or   acetaminophen (TYLENOL) suppository 650 mg  650 mg Rectal Q6H Espinoza, Alejandra, DO       amiodarone (PACERONE) tablet 100 mg  100 mg Oral Daily Gerrit Heck, MD   100 mg at 12/01/20 0941   Chlorhexidine Gluconate Cloth 2 % PADS 6 each  6 each Topical Daily Gerrit Heck, MD   6 each at 12/01/20 0941   liver oil-zinc oxide (DESITIN) 40 % ointment   Topical Daily PRN Zenia Resides, MD   Given at 11/30/20 [phone]   oxyCODONE (Oxy IR/ROXICODONE) immediate release tablet 5 mg  5 mg Oral Q4H PRN Wells Guiles, DO   5 mg at 12/01/20 2203   pantoprazole (PROTONIX) EC tablet 40 mg  40 mg Oral Daily Ezequiel Essex, MD       polyethylene glycol (MIRALAX / GLYCOLAX) packet 17 g  17 g Oral Daily PRN Sharion Settler, DO       senna (SENOKOT) tablet 8.6 mg  1 tablet Oral Daily PRN Merrily Brittle, DO       simvastatin (ZOCOR) tablet 20 mg  20 mg Oral QPM  Gerrit Heck, MD   20 mg at 12/01/20 1700   tamsulosin (FLOMAX) capsule 0.4 mg  0.4 mg Oral QPC breakfast Sharion Settler, DO   0.4 mg at 12/01/20 0941    Allergies as of 11/23/2020   (No Known Allergies)    Family History  Problem Relation Age of Onset   Diabetes Mother     Social History   Socioeconomic History   Marital status: Married    Spouse name: Not on file   Number of children: 3   Years of education: Not on file   Highest education level: Not on file  Occupational History   Not on file  Tobacco Use   Smoking status: Former    Years: 5.00    Types: Cigarettes   Smokeless tobacco: Never  Substance and Sexual Activity   Alcohol use: Yes    Comment: wine with dinner   Drug use: No   Sexual activity: Not on file  Other Topics Concern   Not on file  Social History Narrative   Not on file   Social Determinants of Health   Financial Resource Strain: Not on file  Food Insecurity: Not on file  Transportation Needs: Not on file  Physical Activity: Not on file  Stress: Not on file  Social Connections: Not on file  Intimate Partner Violence: Not on file    Review of Systems: All systems reviewed and negative except where noted in HPI.   OBJECTIVE    Physical Exam: Vital signs in last 24 hours: Temp:  [97.8 F (36.6 C)-98.7 F (37.1 C)] 98.7 F (37.1 C) (11/27 0511) Pulse Rate:  [54-56] 54 (11/27 0511) Resp:  [18-19] 18 (11/27 0511) BP: (100-115)/(51-76) 106/73 (11/27 0511) SpO2:  [98 %] 98 % (11/27 0511) Weight:  [85.4 kg-86.4 kg] 85.4 kg (11/27 0500) Last BM Date: 12/01/20  General:  Alert male in NAD Psych:  Pleasant, cooperative. Normal mood and affect Eyes: Pupils equal, no icterus. Conjunctive pink Ears:  Normal auditory acuity Nose: No deformity, discharge or lesions Neck:  Supple, no masses felt Lungs:  Clear to auscultation.  Heart:  Regular rate, 1+ BLE edema Abdomen:  Soft, nondistended, nontender, active bowel sounds, no  masses felt Rectal :  Deferred Msk: Symmetrical without gross deformities.  Neurologic:  Alert, oriented, grossly normal neurologically Skin:  Intact without significant lesions.    Scheduled inpatient medications  acetaminophen  650 mg Oral Q6H   Or   acetaminophen  650 mg Rectal Q6H   amiodarone  100 mg Oral Daily   Chlorhexidine Gluconate Cloth  6 each Topical Daily   pantoprazole  40 mg Oral Daily   simvastatin  20 mg Oral QPM   tamsulosin  0.4 mg Oral QPC breakfast      Intake/Output from previous day: 11/26 0701 - 11/27 0700 In: 940 [P.O.:940] Out: 1400 [Urine:1400] Intake/Output this shift: Total I/O In: 200 [P.O.:200] Out: -    Lab Results: Recent Labs    11/30/20 0325 12/01/20 0312 12/02/20 0326  WBC 3.5* 3.2* 3.0*  HGB 8.4* 8.2* 8.2*  HCT 26.2* 24.9* 25.7*  PLT 82* 80* 79*   BMET Recent Labs    11/30/20 0325 12/01/20 0312  NA 137 134*  K 4.6 4.5  CL 111 109  CO2 19* 17*  GLUCOSE 139* 127*  BUN 70* 67*  CREATININE 2.54* 2.42*  CALCIUM 8.3* 8.2*   LFTs No results for input(s): PROT, ALBUMIN, AST, ALT, ALKPHOS, BILITOT, BILIDIR, IBILI in the last 72 hours. PT/INR No results for input(s): LABPROT, INR in the last 72 hours. Hepatitis Panel No results for  input(s): HEPBSAG, HCVAB, HEPAIGM, HEPBIGM in the last 72 hours.   . CBC Latest Ref Rng & Units 12/02/2020 12/01/2020 11/30/2020  WBC 4.0 - 10.5 K/uL 3.0(L) 3.2(L) 3.5(L)  Hemoglobin 13.0 - 17.0 g/dL 8.2(L) 8.2(L) 8.4(L)  Hematocrit 39.0 - 52.0 % 25.7(L) 24.9(L) 26.2(L)  Platelets 150 - 400 K/uL 79(L) 80(L) 82(L)    . CMP Latest Ref Rng & Units 12/01/2020 11/30/2020 11/29/2020  Glucose 70 - 99 mg/dL 127(H) 139(H) 140(H)  BUN 8 - 23 mg/dL 67(H) 70(H) 68(H)  Creatinine 0.61 - 1.24 mg/dL 2.42(H) 2.54(H) 2.34(H)  Sodium 135 - 145 mmol/L 134(L) 137 138  Potassium 3.5 - 5.1 mmol/L 4.5 4.6 4.8  Chloride 98 - 111 mmol/L 109 111 114(H)  CO2 22 - 32 mmol/L 17(L) 19(L) 18(L)  Calcium 8.9 -  10.3 mg/dL 8.2(L) 8.3(L) 8.3(L)  Total Protein 6.5 - 8.1 g/dL - - -  Total Bilirubin 0.3 - 1.2 mg/dL - - -  Alkaline Phos 38 - 126 U/L - - -  AST 15 - 41 U/L - - -  ALT 0 - 44 U/L - - -     Principal Problem:   AKI (acute kidney injury) ([place]) Active Problems:   Closed fracture of transverse process of lumbar vertebra (HCC)   Macrocytic anemia   Chronic HFrEF (heart failure with reduced ejection fraction) (HCC)   PAF (paroxysmal atrial fibrillation) (HCC)   Fall   Pancytopenia (HCC)   Acute on chronic systolic heart failure (HCC)   Frailty syndrome in geriatric patient    Tye Savoy, NP-C @  12/02/2020, 9:29 AM

## 2020-12-02 NOTE — Progress Notes (Signed)
FPTS Brief Progress Note  S: Patient was awake and was being assisted with bowel incontinent episode by nurse.  Patient states he does not have any requests or questions at this time and that he is doing well at this time.  O: BP (!) 117/55 (BP Location: Left Arm)   Pulse 65   Temp 98.7 F (37.1 C) (Oral)   Resp 16   Ht _0  (1.803 m)   Wt 85.4 kg   SpO2 91%   BMI 26.26 kg/m   General: Awake, alert, no acute distress  A/P: Pancytopenia Transfusion threshold of 8.  GI recommended EGD but patient opted to wait for bone marrow biopsy results before pursuing EGD. -Follow-up bone marrow biopsy results -CBC in a.m.  CKD 4 AM creatinine 2.42. -A.m. BMP  Urinary retention  BPH UOP 925 mL. -A.m. BMP -Continue tamsulosin daily  T2DM Day team plans to monitor daily via BMP.  Default plan to day team  - Orders reviewed. Labs for AM ordered, which was adjusted as needed.   Wells Guiles, DO 12/02/2020, 10:27 PM PGY-1, [place] Night Resident  Please page [phone] with questions.

## 2020-12-03 DIAGNOSIS — D61818 Other pancytopenia: Secondary | ICD-10-CM | POA: Diagnosis not present

## 2020-12-03 DIAGNOSIS — I5022 Chronic systolic (congestive) heart failure: Secondary | ICD-10-CM | POA: Diagnosis not present

## 2020-12-03 LAB — CBC
HCT: 25.9 % — ABNORMAL LOW (ref 39.0–52.0)
Hemoglobin: 8.4 g/dL — ABNORMAL LOW (ref 13.0–17.0)
MCH: 36.1 pg — ABNORMAL HIGH (ref 26.0–34.0)
MCHC: 32.4 g/dL (ref 30.0–36.0)
MCV: 111.2 fL — ABNORMAL HIGH (ref 80.0–100.0)
Platelets: 90 10*3/uL — ABNORMAL LOW (ref 150–400)
RBC: 2.33 MIL/uL — ABNORMAL LOW (ref 4.22–5.81)
RDW: 20.4 % — ABNORMAL HIGH (ref 11.5–15.5)
WBC: 2.6 10*3/uL — ABNORMAL LOW (ref 4.0–10.5)
nRBC: 0 % (ref 0.0–0.2)

## 2020-12-03 LAB — BASIC METABOLIC PANEL
Anion gap: 6 (ref 5–15)
BUN: 51 mg/dL — ABNORMAL HIGH (ref 8–23)
CO2: 19 mmol/L — ABNORMAL LOW (ref 22–32)
Calcium: 8.3 mg/dL — ABNORMAL LOW (ref 8.9–10.3)
Chloride: 113 mmol/L — ABNORMAL HIGH (ref 98–111)
Creatinine, Ser: 2.14 mg/dL — ABNORMAL HIGH (ref 0.61–1.24)
GFR, Estimated: 29 mL/min — ABNORMAL LOW (ref >60)
Glucose, Bld: 119 mg/dL — ABNORMAL HIGH (ref 70–99)
Potassium: 4.3 mmol/L (ref 3.5–5.1)
Sodium: 138 mmol/L (ref 135–145)

## 2020-12-03 NOTE — TOC Progression Note (Signed)
Transition of Care [facility]) - Progression Note    Patient Details  Name: Stanley Moore MRN: [id number] Date of Birth: [birth year]  Transition of Care [facility]) CM/SW Contact  Emeterio Reeve, [place] Phone Number: 12/03/2020, 3:46 PM  Clinical Narrative:     Pts insurance Josem Kaufmann is still pending.       Expected Discharge Plan and Services                                                 Social Determinants of Health (SDOH) Interventions    Readmission Risk Interventions No flowsheet data found.  Emeterio Reeve, LCSW Clinical Social Worker

## 2020-12-03 NOTE — Progress Notes (Signed)
This chaplain responded to the PMT consult for notarizing the Pt. Advance Directive:  HCPOA.   The Pt. is awake and willing to review HCPOA education with the chaplain.  The Pt. RN-Kayla is at the bedside. The Pt. AD is filled out. The chaplain contacted the spiritual care office for scheduling the notary for today.  Chaplain Sallyanne Kuster [phone]

## 2020-12-03 NOTE — Progress Notes (Signed)
Progress Note  Patient Name: Stanley Moore Date of Encounter: 12/03/2020  [facility] Cardiologist: Evalina Field, MD   Subjective   Patient feels great.  No CP, SOB.  Palpitations.  He notes that he is eager to go home, to rehab, or to his daugther's place for a bit.  Inpatient Medications    Scheduled Meds:  acetaminophen  650 mg Oral Q6H   Or   acetaminophen  650 mg Rectal Q6H   amiodarone  100 mg Oral Daily   Chlorhexidine Gluconate Cloth  6 each Topical Daily   pantoprazole  40 mg Oral Daily   simvastatin  20 mg Oral QPM   tamsulosin  0.8 mg Oral QPC breakfast   Continuous Infusions:  PRN Meds: liver oil-zinc oxide, oxyCODONE, polyethylene glycol, senna   Vital Signs    Vitals:   12/02/20 2111 12/03/20 0500 12/03/20 0602 12/03/20 0852  BP: (!) 117/55  (!) 117/57 126/60  Pulse: 65  (!) 57 62  Resp: 16  16 16   Temp: 98.7 F (37.1 C)  98.9 F (37.2 C) 98.4 F (36.9 C)  TempSrc: Oral  Oral   SpO2: 91%  96% 94%  Weight:  85.3 kg    Height:        Intake/Output Summary (Last 24 hours) at 12/03/2020 1333 Last data filed at 12/03/2020 1300 Gross per 24 hour  Intake 817 ml  Output 1226 ml  Net -409 ml   Last 3 Weights 12/03/2020 12/02/2020 12/01/2020  Weight (lbs) 188 lb 0.8 oz 188 lb 4.4 oz 190 lb 7.6 oz  Weight (kg) 85.3 kg 85.4 kg 86.4 kg      Telemetry     Sinus bradycardia  50s isolated PVCs     - Personally Reviewed  ECG    No new  - Personally Reviewed  Physical Exam   GEN: No acute distress.  Neck: JVP to lower neck Cardiac: RRR, Gr II/VI systolic murmur    Respiratory: Clear to auscultation bilaterally. GI: Soft, nontender, non-distended  MS: No pitting edema; No deformity. Neuro:  Nonfocal  Psych: Normal affect   Labs    High Sensitivity Troponin:   Recent Labs  Lab 11/23/20 1619 11/23/20 1944  TROPONINIHS 65* 64*     Chemistry Recent Labs  Lab 11/27/20 0230 11/28/20 0255 11/30/20 0325 12/01/20 0312  12/03/20 0441  NA 136   < > 137 134* 138  K 4.2   < > 4.6 4.5 4.3  CL 112*   < > 111 109 113*  CO2 18*   < > 19* 17* 19*  GLUCOSE 139*   < > 139* 127* 119*  BUN 80*   < > 70* 67* 51*  CREATININE 2.33*   < > 2.54* 2.42* 2.14*  CALCIUM 8.2*   < > 8.3* 8.2* 8.3*  PROT 5.7*  --   --   --   --   ALBUMIN 2.8*  --   --   --   --   AST 21  --   --   --   --   ALT 16  --   --   --   --   ALKPHOS 45  --   --   --   --   BILITOT 1.1  --   --   --   --   GFRNONAA 26*   < > 24* 25* 29*  ANIONGAP 6   < > 7 8 6    < > =  values in this interval not displayed.    Lipids No results for input(s): CHOL, TRIG, HDL, LABVLDL, LDLCALC, CHOLHDL in the last 168 hours.  Hematology Recent Labs  Lab 12/01/20 0312 12/02/20 0326 12/03/20 0441  WBC 3.2* 3.0* 2.6*  RBC 2.24* 2.26* 2.33*  HGB 8.2* 8.2* 8.4*  HCT 24.9* 25.7* 25.9*  MCV 111.2* 113.7* 111.2*  MCH 36.6* 36.3* 36.1*  MCHC 32.9 31.9 32.4  RDW 21.4* 21.2* 20.4*  PLT 80* 79* 90*   Thyroid  No results for input(s): TSH, FREET4 in the last 168 hours.   BNP No results for input(s): BNP, PROBNP in the last 168 hours.   DDimer No results for input(s): DDIMER in the last 168 hours.   Radiology    No results found.  Cardiac Studies   1. Compared to 12/15/19 MR is now severe. Global hypokinesis with akinesis of the basal inferolateral wall; overall severe LV dysfunction. 2. Left ventricular ejection fraction, by estimation, is 25 to 30%. The left ventricle has severely decreased function. The left ventricle demonstrates regional wall motion abnormalities (see scoring diagram/findings for description). The left ventricular internal cavity size was moderately dilated. Left ventricular diastolic parameters are consistent with Grade II diastolic dysfunction (pseudonormalization). Elevated left atrial pressure. 3. Right ventricular systolic function is mildly reduced. The right ventricular size is mildly enlarged. There is severely elevated  pulmonary artery systolic pressure. 4. 5. Left atrial size was severely dilated. 6. Right atrial size was severely dilated. The mitral valve is degenerative. Severe mitral valve regurgitation. No evidence of mitral stenosis. 7. The aortic valve is tricuspid. Aortic valve regurgitation is trivial. Mild aortic valve sclerosis is present, with no evidence of aortic valve stenosis. 8. The inferior vena cava is dilated in size with <50% respiratory variability, suggesting right atrial pressure of 15 mmHg. 9. Comparison(s): Prior EF 25-30%.  Patient Profile  Stanley Moore is a 87 y.o. male with a hx of CAD, LBBB, paroxysmal atrial fibrillation, diabetes mellitus, chronic systolic heart failure, severe mitral regurgitation, hypertension, hyperlipidemia and chronic kidney disease stage III/IV who is being seen 11/24/2020 for the evaluation of CHF at the request of Dr. Andria Frames.  Assessment & Plan    PAF  PVCs Recent NSTEMI with minimal medical therapy available due to comorbidities - CHADSVASC 5 on no ACE with concerns for bleeding, fall, and pancytopenia; ASA also prohibitive - continue amiodarone - continue zocor  HFrEF Mod-severe MR AKI on CKD Creatinine has improved today will need a diuretic regimen, potential lasix 20 mg PO daily now that he has has improvement with torsemide - limited GDMT available in the setting of bradycardia and relative hypotension and AKI - overall guarded prognosis; we have discussed this with patient and he has seen PC  Potential SO 12/04/20 if we are unable to further optimized medications due to comorbidity  For questions or updates, please contact [facility] Please consult [email or web address] for contact info under        Signed, Werner Lean, MD  12/03/2020, 1:33 PM

## 2020-12-03 NOTE — Progress Notes (Signed)
This chaplain is present with the Pt., notary, and two witnesses for the notarizing of the Pt. Advance Directive:  HCPOA.  The Pt. named Gerald Leitz as his healthcare agent. If the healthcare is unwilling or unable to serve, the Pt. names Foye Deer as his next choice.  The chaplain gave the original AD to the Pt. along with two copies.  The chaplain scanned a copy of the Pt. AD into his EMR.  This chaplain is available for F/U spiritual care as needed.  Chaplain Sallyanne Kuster [phone]

## 2020-12-03 NOTE — Progress Notes (Signed)
FPTS Brief Note Reviewed patient's vitals, recent notes.  Vitals:   12/03/20 0852 12/03/20 2046  BP: 126/60 124/64  Pulse: 62 (!) 59  Resp: 16 16  Temp: 98.4 F (36.9 C) 97.6 F (36.4 C)  SpO2: 94% 99%   At this time, no change in plan from day progress note.  Sharion Settler, DO Page [phone] with questions about this patient.

## 2020-12-03 NOTE — Progress Notes (Signed)
Family Medicine Teaching Service Daily Progress Note Intern Pager: [phone]  Patient name: Stanley Moore Medical record number: [id number] Date of birth: May 19, 1931 Age: 87 y.o. Gender: male  Primary Care Provider: Heywood Bene, PA-C Consultants: Cardiology, Oncology, Palliative, IR (s/o), Gastroenterology Code Status: DNR  Pt Overview and Major Events to Date:  11/18: Admitted, transfused PRBC 1 unit 11/23: Bone marrow biopsy 11/24: Blood transfusion PRBC 1 unit, Foley placement  Assessment and Plan:  Stanley Moore is a 87 y.o. male who presented with  weakness and low back pain s/p fall and found to have multiple TP and 12th rib fractures. PMHx of HTN, HLD, T2DM, CKD 3B, persistent A. fib, HFrEF (25-30%), AV block, mitral regurg, CAD, NSTEMI (10/13/2020). Patient medically stable for discharge.   Pancytopenia WBC 2.6 trending down today (3.0, 3.2, 3.5), Hgb stable at 8.4, (8.2, 8.2, 8.4), PLT improved at 90 (79, 80, 82), transfusion threshold of 8 due to cardiac history. Patient medically stable for discharge. -GI following, appreciate recs -Oncology following, appreciate recs -Continue protonix -f/u bone marrow bx -Continue to hold anticoagulants   CKD IV Cr improved to 2.14 today, from 2.42 yesterday.  -AM BMP   Urinary retention  BPH UOP was 1.225L over last 24 hours. Foley has been in for 3 days. -Continue tamsulosin 0.4 mg daily   CHF exacerbation  HFrEF 25-30%  Hx LBBB  mod MR Wt 85.3 from 85. 4 yesterday. UOP was 1.225 L yesterday. Cr improved today at 2.14 from 2.42 yesterday. Patient fluid status on exam today is improved with no pitting edema appreciated. -Cardiology following, appreciate recs -Continue I/O -Daily Wt -BMP   Persistent A. fib  transient V. Tach  sinus bradycardia HR with stable bradycardia -Continue amiodarone   DM2 Glc stable at 119 on BMP   +FOBT  Goals of Care Patient with positive FOBT on 12/01/20. GI  consulted, patient prefers to wait for bone marrow biopsy results. He dose not want an EGD as he's ready to leave hospital.  All other conditions chronic and stable  FEN/GI: Regular Diet PPx: SCDs Dispo:SNF pending clinical improvement . Barriers include continued medical work up.   Subjective:  Patient in good spirits this morning with no complaints. Notes that he's unsure if he's having bowel movements or gas.   Objective: Temp:  [97.7 F (36.5 C)-98.9 F (37.2 C)] 98.9 F (37.2 C) (11/28 0602) Pulse Rate:  [53-65] 57 (11/28 0602) Resp:  [16-18] 16 (11/28 0602) BP: (116-136)/(40-59) 117/57 (11/28 0602) SpO2:  [91 %-99 %] 96 % (11/28 0602) Weight:  [85.3 kg] 85.3 kg (11/28 0500) Physical Exam: General: Frail, elderly, white male, NAD Cardiovascular: Distant heart sounds, RRR, peripheral pulses intact Respiratory: CTABL Abdomen: Soft, NTTP, non-distended Extremities: Moving all extremities independently, no edema in LE  Laboratory: Recent Labs  Lab 11/30/20 0325 12/01/20 0312 12/02/20 0326  WBC 3.5* 3.2* 3.0*  HGB 8.4* 8.2* 8.2*  HCT 26.2* 24.9* 25.7*  PLT 82* 80* 79*   Recent Labs  Lab 11/27/20 0230 11/28/20 0255 11/30/20 0325 12/01/20 0312 12/03/20 0441  NA 136   < > 137 134* 138  K 4.2   < > 4.6 4.5 4.3  CL 112*   < > 111 109 113*  CO2 18*   < > 19* 17* 19*  BUN 80*   < > 70* 67* 51*  CREATININE 2.33*   < > 2.54* 2.42* 2.14*  CALCIUM 8.2*   < > 8.3* 8.2* 8.3*  PROT 5.7*  --   --   --   --  BILITOT 1.1  --   --   --   --   ALKPHOS 45  --   --   --   --   ALT 16  --   --   --   --   AST 21  --   --   --   --   GLUCOSE 139*   < > 139* 127* 119*   < > = values in this interval not displayed.      Imaging/Diagnostic Tests:   Holley Bouche, MD 12/03/2020, 6:33 AM PGY-1, [place] Intern pager: [phone], text pages welcome

## 2020-12-04 DIAGNOSIS — I5022 Chronic systolic (congestive) heart failure: Secondary | ICD-10-CM | POA: Diagnosis not present

## 2020-12-04 DIAGNOSIS — N179 Acute kidney failure, unspecified: Secondary | ICD-10-CM | POA: Diagnosis not present

## 2020-12-04 DIAGNOSIS — S32009D Unspecified fracture of unspecified lumbar vertebra, subsequent encounter for fracture with routine healing: Secondary | ICD-10-CM | POA: Diagnosis not present

## 2020-12-04 LAB — CBC
HCT: 26.9 % — ABNORMAL LOW (ref 39.0–52.0)
Hemoglobin: 8.5 g/dL — ABNORMAL LOW (ref 13.0–17.0)
MCH: 35.4 pg — ABNORMAL HIGH (ref 26.0–34.0)
MCHC: 31.6 g/dL (ref 30.0–36.0)
MCV: 112.1 fL — ABNORMAL HIGH (ref 80.0–100.0)
Platelets: 100 10*3/uL — ABNORMAL LOW (ref 150–400)
RBC: 2.4 MIL/uL — ABNORMAL LOW (ref 4.22–5.81)
RDW: 20.1 % — ABNORMAL HIGH (ref 11.5–15.5)
WBC: 2.8 10*3/uL — ABNORMAL LOW (ref 4.0–10.5)
nRBC: 0 % (ref 0.0–0.2)

## 2020-12-04 MED ORDER — SENNA 8.6 MG PO TABS: 1.0000 | ORAL_TABLET | Freq: Every day | ORAL | 0 refills | Status: AC | PRN

## 2020-12-04 MED ORDER — CLOPIDOGREL BISULFATE 75 MG PO TABS
75.0000 mg | ORAL_TABLET | Freq: Every day | ORAL | Status: DC
  Administered 2020-12-04: 75 mg via ORAL

## 2020-12-04 MED ORDER — FUROSEMIDE 40 MG PO TABS
ORAL_TABLET | ORAL | Status: AC
  Filled 2020-12-04: qty 1

## 2020-12-04 MED ORDER — ZINC OXIDE 40 % EX OINT: TOPICAL_OINTMENT | Freq: Every day | CUTANEOUS | 0 refills | Status: AC | PRN

## 2020-12-04 MED ORDER — POLYETHYLENE GLYCOL 3350 17 G PO PACK: 17.0000 g | PACK | Freq: Every day | ORAL | 0 refills | Status: AC | PRN

## 2020-12-04 MED ORDER — TAMSULOSIN HCL 0.4 MG PO CAPS: 0.8000 mg | ORAL_CAPSULE | Freq: Every day | ORAL | Status: AC

## 2020-12-04 MED ORDER — OXYCODONE HCL 5 MG PO TABS: 5.0000 mg | ORAL_TABLET | ORAL | 0 refills | Status: AC | PRN

## 2020-12-04 MED ORDER — CLOPIDOGREL BISULFATE 75 MG PO TABS
ORAL_TABLET | ORAL | Status: AC
  Filled 2020-12-04: qty 1

## 2020-12-04 MED ORDER — PANTOPRAZOLE SODIUM 40 MG PO TBEC: 40.0000 mg | DELAYED_RELEASE_TABLET | Freq: Every day | ORAL | 0 refills | Status: AC

## 2020-12-04 MED ORDER — ACETAMINOPHEN 325 MG PO TABS
650.0000 mg | ORAL_TABLET | Freq: Four times a day (QID) | ORAL | Status: AC
Start: 2020-12-04 — End: ?

## 2020-12-04 MED ORDER — FUROSEMIDE 20 MG PO TABS
20.0000 mg | ORAL_TABLET | Freq: Every day | ORAL | Status: DC
  Administered 2020-12-04: 20 mg via ORAL

## 2020-12-04 NOTE — Progress Notes (Signed)
Family Medicine Teaching Service Daily Progress Note Intern Pager: [phone]  Patient name: RANDE ROYLANCE Medical record number: [id number] Date of birth: [birth year] Age: 87 y.o. Gender: male  Primary Care Provider: Heywood Bene, PA-C Consultants: Cardiology, Oncology, Palliative, IR (s/o), Gastroenterology Code Status: DNR  Pt Overview and Major Events to Date:  11/18: Admitted, transfused PRBC 1 unit 11/23: Bone marrow biopsy 11/24: Blood transfusion PRBC 1 unit, Foley placement 11/28: Patient elected HCPOA, Debra Hammett Assessment and Plan:  EUSEBIO BLAZEJEWSKI is a 87 y.o. male who presented with  weakness and low back pain s/p fall and found to have multiple TP and 12th rib fractures. PMHx of HTN, HLD, T2DM, CKD 3B, persistent A. fib, HFrEF (25-30%), AV block, mitral regurg, CAD, NSTEMI (10/13/2020). Patient medically stable for discharge.   Pancytopenia WBC stable at 2.8, Hgb stable at 8.5, PLT improved at 100, transfusion threshold 8 due to cardiac history. Patient medically stable for discharge -GI following, appreciate recs -Oncology following, appreciate recs -Continue Protonix 40 mg daily -F/u bone marrow biopsy -Continue to hold anticoagulants  AKI on CKD IV Yesterday Cr 2.14, improved from previous. Will check Cr labs again on tomorrow BMP. Baseline Cr around 1.8-2.0. -AM BMP MWF  Urinary Retention  BPH UOP yesterday was 2.3 L Foley has been in for 4 days.  -Continue tamsulosin 0.8 mg daily  CHF exacerbation  HFrEF 25-30%  Hx LBBB  mod MR - resolved Patient weight 85.6 kg today from 85.3 kg yesterday. Volume status on exam is stable, with no pitting edema today. -consider lasix 20 mg PO daily -Daily Wt -MWF BMP -Strict I/O   Persistent A. fib  transient V. Tach  sinus bradycardia HR stable with bradycardia range 50-60's. -Continue amiodarone 100 mg -Continue simvastatin 20 mg   DM2 Sugars have been stable this admission. Will check  glucose on BMP tomorrow  Goals of Care Patient met with Chaplain services and notary yesterday to name Gerald Leitz as HCPOA, with Foye Deer as next choice.  All other problems chronic and stable -constipation  FEN/GI: Regular Diet PPx: SCDs Dispo:SNF  Medically stable for discharge .    Subjective:  Patient laying comfortably in bed, eating breakfast. No complaints or concerns  Objective: Temp:  [97.6 F (36.4 C)-98.4 F (36.9 C)] 97.6 F (36.4 C) (11/29 0501) Pulse Rate:  [59-67] 67 (11/29 0501) Resp:  [16] 16 (11/29 0501) BP: (122-126)/(59-64) 122/59 (11/29 0501) SpO2:  [94 %-99 %] 98 % (11/29 0501) Weight:  [85.6 kg] 85.6 kg (11/29 0501) Physical Exam: General: Frail, elderly, well appearing, white male, NASD Cardiovascular: Distant heart sounds with RRR, NRMG Respiratory: CTABL Abdomen: Soft, NTTP, non-distended Extremities: No edema, moving all extremities independently  Laboratory: Recent Labs  Lab 12/02/20 0326 12/03/20 0441 12/04/20 0221  WBC 3.0* 2.6* 2.8*  HGB 8.2* 8.4* 8.5*  HCT 25.7* 25.9* 26.9*  PLT 79* 90* 100*   Recent Labs  Lab 11/30/20 0325 12/01/20 0312 12/03/20 0441  NA 137 134* 138  K 4.6 4.5 4.3  CL 111 109 113*  CO2 19* 17* 19*  BUN 70* 67* 51*  CREATININE 2.54* 2.42* 2.14*  CALCIUM 8.3* 8.2* 8.3*  GLUCOSE 139* 127* 119*      Imaging/Diagnostic Tests:   Holley Bouche, MD 12/04/2020, 6:24 AM PGY-1, [place] Intern pager: [phone], text pages welcome

## 2020-12-04 NOTE — Plan of Care (Signed)
  Problem: Activity: Goal: Risk for activity intolerance will decrease Outcome: Progressing   Problem: Nutrition: Goal: Adequate nutrition will be maintained Outcome: Progressing   Problem: Coping: Goal: Level of anxiety will decrease Outcome: Progressing   

## 2020-12-04 NOTE — TOC Progression Note (Addendum)
Transition of Care [facility]) - Initial/Assessment Note    Patient Details  Name: Stanley Moore MRN: [id number] Date of Birth: January 02, 1932  Transition of Care [facility]) CM/SW Contact:    Milinda Antis, [place] Phone Number: 12/04/2020, 9:08 AM  Clinical Narrative:                  CSW attempted to contacted [facility] to receive an update on he status of insurance auth.  There was no answer.  CSW left a VM requesting a returned call.   10:02-  CSW contacted the patient's family and provided an update on the status of SNF placement.  11:13- Civil Service fast streamer received.  Attending notified of need for d/c summary prior to 14:00 so that the patient can d/c today.      Patient Goals and CMS Choice        Expected Discharge Plan and Services                                                Prior Living Arrangements/Services                       Activities of Daily Living Home Assistive Devices/Equipment: Cane (specify quad or straight) ADL Screening (condition at time of admission) Patient's cognitive ability adequate to safely complete daily activities?: Yes Is the patient deaf or have difficulty hearing?: No Does the patient have difficulty seeing, even when wearing glasses/contacts?: No Does the patient have difficulty concentrating, remembering, or making decisions?: No Patient able to express need for assistance with ADLs?: Yes Does the patient have difficulty dressing or bathing?: Yes Independently performs ADLs?: No Communication: Independent Dressing (OT): Needs assistance Is this a change from baseline?: Change from baseline, expected to last >3 days Grooming: Independent Feeding: Independent Bathing: Needs assistance Is this a change from baseline?: Change from baseline, expected to last >3 days Toileting: Needs assistance Is this a change from baseline?: Change from baseline, expected to last >3days In/Out Bed: Needs assistance Is this a  change from baseline?: Change from baseline, expected to last >3 days Walks in Home: Needs assistance Is this a change from baseline?: Change from baseline, expected to last >3 days Does the patient have difficulty walking or climbing stairs?: Yes Weakness of Legs: Both Weakness of Arms/Hands: Both  Permission Sought/Granted                  Emotional Assessment              Admission diagnosis:  Fall [W19.XXXA] AKI (acute kidney injury) ([place]) [N17.9] Closed fracture of transverse process of lumbar vertebra, initial encounter [facility]) [S32.009A] Patient Active Problem List   Diagnosis Date Noted   Activity intolerance 12/02/2020   Impaired functional mobility, balance, gait, and endurance 12/02/2020   Fecal occult blood test positive    Acute on chronic systolic heart failure (HCC)    Frailty syndrome in geriatric patient    Pancytopenia ([place])    Fall    Closed fracture of transverse process of lumbar vertebra (HCC)    Macrocytic anemia    Chronic HFrEF (heart failure with reduced ejection fraction) (HCC)    PAF (paroxysmal atrial fibrillation) ([place])    AKI (acute kidney injury) ([place]) 11/23/2020   NSTEMI (non-ST elevated myocardial infarction) ([place]) 10/13/2020   Persistent atrial fibrillation (  [place])    Unspecified essential hypertension 04/06/2012   Pure hypercholesterolemia 04/06/2012   Type II or unspecified type diabetes mellitus without mention of complication, not stated as uncontrolled 04/06/2012   PCP:  Heywood Bene, PA-C Pharmacy:   [facility] ORDER) [place], [place] [address] Phone: [phone] Fax: [phone]  [id number] - [place], [address] AT [address] [place] [address] Phone: [phone] Fax: [phone]     Social Determinants of Health (SDOH) Interventions    Readmission Risk  Interventions No flowsheet data found.

## 2020-12-04 NOTE — Progress Notes (Signed)
Medically stable for DC to SNF. Orders placed. Opiate order hard copy signed and placed in chart at floor nursing desk. Discharge summary signed. SW notified.   Ezequiel Essex, MD PGY-2, [place] Medicine Service pager [phone]

## 2020-12-04 NOTE — Progress Notes (Signed)
Tried to call for a report but its  on a voice mail,left a voicemail message and return number to call to.

## 2020-12-04 NOTE — TOC Transition Note (Signed)
Transition of Care [facility]) - CM/SW Discharge Note   Patient Details  Name: Stanley Moore MRN: [id number] Date of Birth: 1931/10/25  Transition of Care [facility]) CM/SW Contact:  Milinda Antis, [place] Phone Number: 12/04/2020, 1:48 PM   Clinical Narrative:     [facility] Anticipated DC date: 12/04/2020 Family notified: yes Transport by: Corey Harold   Per MD patient ready for DC to SNF. RN to call report prior to discharge [phone]. RN, patient, patient's family, and facility notified of DC. Discharge Summary and FL2 sent to facility. DC packet on chart. Ambulance transport requested for patient.   CSW will sign off for now as social work intervention is no longer needed. Please consult [place] again if new needs arise.          Patient Goals and CMS Choice        Discharge Placement                       Discharge Plan and Services                                     Social Determinants of Health (SDOH) Interventions     Readmission Risk Interventions No flowsheet data found.

## 2020-12-04 NOTE — Progress Notes (Signed)
Occupational Therapy Treatment Patient Details Name: Stanley Moore MRN: [id number] DOB: 1932/01/01 Today's Date: 12/04/2020   History of present illness 87 y.o. male presents to [facility] on 11/23/2020 after multiple falls. PT found to have L 12th rib fx as well as L1-3 TP fxs and L1 corner fx of vertebral body. Hgb 7.4 on admission. PMH includes HTN, HLD, T2DM, CKD 3b, persistent A. 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   OT comments  Pt  at this time presented in bed. Pt required supervision to complete bed mobility with HOB elevated and bed rail. Pt noted to have BM but was unaware and due to incontinence.  Pt required min guard for sit to stand but with prolong standing required required min assist with FW. Pt required max to total assist for hygiene. Pt then reported they must lay back into bed as fatigued with activity. It was noted pt could not recall when Physical Therapy came into room they did not ambulate within Occupational Therapy session. Pt currently with functional limitations due to the deficits listed below (see OT Problem List).  Pt will benefit from skilled OT to increase their safety and independence with ADL and functional mobility for ADL to facilitate discharge to venue listed below.     Recommendations for follow up therapy are one component of a multi-disciplinary discharge planning process, led by the attending physician.  Recommendations may be updated based on patient status, additional functional criteria and insurance authorization.    Follow Up Recommendations  Skilled nursing-short term rehab (<3 hours/day)    Assistance Recommended at Discharge Frequent or constant Supervision/Assistance  Equipment Recommendations  BSC/3in1    Recommendations for Other Services      Precautions / Restrictions Precautions Precautions: Fall;Back Precaution Comments: fecal incontinence, sacral wound Required Braces or  Orthoses: Spinal Brace Spinal Brace: Thoracolumbosacral orthotic;Applied in sitting position Restrictions Weight Bearing Restrictions: No       Mobility Bed Mobility Overal bed mobility: Needs Assistance Bed Mobility: Rolling;Supine to Sit;Sit to Supine Rolling: Supervision Sidelying to sit: Supervision;HOB elevated Supine to sit: Supervision;HOB elevated Sit to supine: Supervision;HOB elevated        Transfers Overall transfer level: Needs assistance Equipment used: Rolling walker (2 wheels) Transfers: Sit to/from Stand Sit to Stand: Min guard;From elevated surface                 Balance Overall balance assessment: Needs assistance;History of Falls Sitting-balance support: Feet supported;Bilateral upper extremity supported Sitting balance-Leahy Scale: Fair Sitting balance - Comments: Pt reliant on BUE when sitting at EOB                                   ADL either performed or assessed with clinical judgement   ADL Overall ADL's : Needs assistance/impaired Eating/Feeding: Independent;Sitting   Grooming: Min guard;Sitting   Upper Body Bathing: Min guard;Cueing for safety;Cueing for sequencing;Sitting   Lower Body Bathing: Maximal assistance;Cueing for safety;Cueing for sequencing;Sit to/from stand   Upper Body Dressing : Min guard;Sitting;Cueing for safety;Cueing for sequencing   Lower Body Dressing: Maximal assistance;Sit to/from stand       Toileting- Water quality scientist and Hygiene: Maximal assistance;Sit to/from stand         General ADL Comments: Pt requires max to total assist as noted bowel incontience    Extremity/Trunk Assessment Upper Extremity Assessment Upper Extremity Assessment: Generalized weakness   Lower Extremity  Assessment Lower Extremity Assessment: Defer to PT evaluation        Vision   Vision Assessment?: No apparent visual deficits   Perception     Praxis      Cognition Arousal/Alertness:  Awake/alert Behavior During Therapy: WFL for tasks assessed/performed Overall Cognitive Status: Impaired/Different from baseline Area of Impairment: Memory;Following commands;Safety/judgement;Awareness;Problem solving                     Memory: Decreased short-term memory Following Commands: Follows one step commands consistently Safety/Judgement: Decreased awareness of safety   Problem Solving: Slow processing General Comments: Pt could not recall on the events within the session completed          Exercises     Shoulder Instructions       General Comments Pt had bowel movement with movement and needed complete change and banage change with nursing    Pertinent Vitals/ Pain       Pain Assessment: Faces Faces Pain Scale: Hurts little more Pain Location: low back, buttocks (wound) Pain Descriptors / Indicators: Sore;Discomfort;Guarding Pain Intervention(s): Limited activity within patient's tolerance;Monitored during session;Repositioned  Home Living                                          Prior Functioning/Environment              Frequency  Min 2X/week        Progress Toward Goals  OT Goals(current goals can now be found in the care plan section)  Progress towards OT goals: Progressing toward goals  Acute Rehab OT Goals Patient Stated Goal: TO rest OT Goal Formulation: With patient Time For Goal Achievement: 12/08/20 Potential to Achieve Goals: Good ADL Goals Pt Will Perform Grooming: with set-up;standing Pt Will Perform Lower Body Bathing: with supervision;with adaptive equipment;sit to/from stand Pt Will Perform Lower Body Dressing: with supervision;sit to/from stand;with adaptive equipment Pt Will Transfer to Toilet: with supervision;ambulating Additional ADL Goal #1: Pt to verbalize 3/3 back precautions Independently Additional ADL Goal #2: Pt to verbalize at least 3 fall prevention strategies to implement in home  environment  Plan Discharge plan remains appropriate    Co-evaluation                 AM-PAC OT "6 Clicks" Daily Activity     Outcome Measure   Help from another person eating meals?: None Help from another person taking care of personal grooming?: A Little Help from another person toileting, which includes using toliet, bedpan, or urinal?: A Lot Help from another person bathing (including washing, rinsing, drying)?: A Lot Help from another person to put on and taking off regular upper body clothing?: A Little Help from another person to put on and taking off regular lower body clothing?: A Lot 6 Click Score: 16    End of Session Equipment Utilized During Treatment: Gait belt;Rolling walker (2 wheels);Back brace  OT Visit Diagnosis: Unsteadiness on feet (R26.81);Other abnormalities of gait and mobility (R26.89);Muscle weakness (generalized) (M62.81);History of falling (Z91.81) Pain - part of body:  (wound/back)   Activity Tolerance Patient limited by fatigue   Patient Left in bed;with call bell/phone within reach;with bed alarm set   Nurse Communication  (wound care)        Time: 3614-4315 OT Time Calculation (min): 38 min  Charges: OT General Charges $OT Visit: 1 Visit OT Treatments $Self  Care/Home Management : 38-52 mins  Joeseph Amor OTR/L  Acute Rehab Services  [phone] office number [phone] pager number   Joeseph Amor 12/04/2020, 12:04 PM

## 2020-12-04 NOTE — Progress Notes (Signed)
DISCHARGE NOTE SNF [facility] to be discharged [place] per MD order. Patient verbalized understanding.  Skin clean, dry.Stage II on each buttocks ,foam dressing changed done.. IV catheter discontinued intact. Site without signs and symptoms of complications. Dressing and pressure applied. Pt denies pain at the site currently. No complaints noted.  Patient free of lines, drains, and wounds.   Discharge packet assembled. An After Visit Summary (AVS) was printed and given to the EMS personnel. Patient escorted via stretcher and discharged to [facility] via ambulance. Report called to accepting facility; all questions and concerns addressed.   [place], Zenon Mayo, RN

## 2020-12-04 NOTE — Progress Notes (Signed)
Progress Note  Patient Name: Stanley Moore Date of Encounter: 12/04/2020  [facility] Cardiologist: Evalina Field, MD   Subjective   Patient feels great.  No CP, SOB.  Palpitations.  Doesn't remember needing diuretics in the past.  Inpatient Medications    Scheduled Meds:  acetaminophen  650 mg Oral Q6H   Or   acetaminophen  650 mg Rectal Q6H   amiodarone  100 mg Oral Daily   Chlorhexidine Gluconate Cloth  6 each Topical Daily   pantoprazole  40 mg Oral Daily   simvastatin  20 mg Oral QPM   tamsulosin  0.8 mg Oral QPC breakfast   Continuous Infusions:  PRN Meds: liver oil-zinc oxide, oxyCODONE, polyethylene glycol, senna   Vital Signs    Vitals:   12/03/20 0852 12/03/20 2046 12/04/20 0501 12/04/20 1027  BP: 126/60 124/64 (!) 122/59 119/66  Pulse: 62 (!) 59 67 63  Resp: 16 16 16 18   Temp: 98.4 F (36.9 C) 97.6 F (36.4 C) 97.6 F (36.4 C) 98.4 F (36.9 C)  TempSrc:  Axillary Oral   SpO2: 94% 99% 98% 97%  Weight:  85.6 kg 85.6 kg   Height:        Intake/Output Summary (Last 24 hours) at 12/04/2020 1051 Last data filed at 12/04/2020 0502 Gross per 24 hour  Intake 477 ml  Output 2300 ml  Net -1823 ml   Last 3 Weights 12/04/2020 12/03/2020 12/03/2020  Weight (lbs) 188 lb 11.4 oz 188 lb 11.4 oz 188 lb 0.8 oz  Weight (kg) 85.6 kg 85.6 kg 85.3 kg      Telemetry    Sinus bradycardia 50s isolated PVCs   - Personally Reviewed  ECG    No new - Personally Reviewed  Physical Exam   GEN: No acute distress.  Neck: JVP to mid neck Cardiac: RRR, Gr II/VI systolic murmur    Respiratory: Clear to auscultation bilaterally. GI: Soft, nontender, non-distended  MS: No pitting edema; No deformity. Neuro:  Nonfocal  Psych: Normal affect   Labs    High Sensitivity Troponin:   Recent Labs  Lab 11/23/20 1619 11/23/20 1944  TROPONINIHS 65* 64*     Chemistry Recent Labs  Lab 11/30/20 0325 12/01/20 0312 12/03/20 0441  NA 137 134* 138  K 4.6  4.5 4.3  CL 111 109 113*  CO2 19* 17* 19*  GLUCOSE 139* 127* 119*  BUN 70* 67* 51*  CREATININE 2.54* 2.42* 2.14*  CALCIUM 8.3* 8.2* 8.3*  GFRNONAA 24* 25* 29*  ANIONGAP 7 8 6     Lipids No results for input(s): CHOL, TRIG, HDL, LABVLDL, LDLCALC, CHOLHDL in the last 168 hours.  Hematology Recent Labs  Lab 12/02/20 0326 12/03/20 0441 12/04/20 0221  WBC 3.0* 2.6* 2.8*  RBC 2.26* 2.33* 2.40*  HGB 8.2* 8.4* 8.5*  HCT 25.7* 25.9* 26.9*  MCV 113.7* 111.2* 112.1*  MCH 36.3* 36.1* 35.4*  MCHC 31.9 32.4 31.6  RDW 21.2* 20.4* 20.1*  PLT 79* 90* 100*   Thyroid  No results for input(s): TSH, FREET4 in the last 168 hours.   BNP No results for input(s): BNP, PROBNP in the last 168 hours.   DDimer No results for input(s): DDIMER in the last 168 hours.   Radiology    No results found.  Cardiac Studies   1. Compared to 12/15/19 MR is now severe. Global hypokinesis with akinesis of the basal inferolateral wall; overall severe LV dysfunction. 2. Left ventricular ejection fraction, by estimation, is 25  to 30%. The left ventricle has severely decreased function. The left ventricle demonstrates regional wall motion abnormalities (see scoring diagram/findings for description). The left ventricular internal cavity size was moderately dilated. Left ventricular diastolic parameters are consistent with Grade II diastolic dysfunction (pseudonormalization). Elevated left atrial pressure. 3. Right ventricular systolic function is mildly reduced. The right ventricular size is mildly enlarged. There is severely elevated pulmonary artery systolic pressure. 4. 5. Left atrial size was severely dilated. 6. Right atrial size was severely dilated. The mitral valve is degenerative. Severe mitral valve regurgitation. No evidence of mitral stenosis. 7. The aortic valve is tricuspid. Aortic valve regurgitation is trivial. Mild aortic valve sclerosis is present, with no evidence of aortic valve  stenosis. 8. The inferior vena cava is dilated in size with <50% respiratory variability, suggesting right atrial pressure of 15 mmHg. 9. Comparison(s): Prior EF 25-30%.  Patient Profile  Stanley Moore is a 87 y.o. male with a hx of CAD, LBBB, paroxysmal atrial fibrillation, diabetes mellitus, chronic systolic heart failure, severe mitral regurgitation, hypertension, hyperlipidemia and chronic kidney disease stage III/IV who is being seen 11/24/2020 for the evaluation of CHF at the request of Dr. Andria Frames.  Assessment & Plan    PAF  PVCs Recent NSTEMI with minimal medical therapy available due to comorbidities - CHADSVASC 5 on no ACE with concerns for bleeding, fall, and pancytopenia; ASA also prohibitive - returned plavix; will not plan on returning DOAC this admission, patient is aware of increased stroke risk - continue amiodarone - continue zocor  HFrEF Mod-severe MR AKI on CKD - fluid status is worsening; given the balance between kidney and volume start starting lasix 20 mg PO daily - limited GDMT available in the setting of bradycardia and relative hypotension and AKI - overall guarded prognosis; we have discussed this with patient and he has seen PC  Reasonable for SNF.  He has 01/14/21 f/u with Primary cardiologist  For questions or updates, please contact [place] Please consult [email or web address] for contact info under        Signed, Werner Lean, MD  12/04/2020, 10:51 AM

## 2020-12-04 NOTE — Progress Notes (Signed)
Report given to [facility] R.N.

## 2020-12-04 NOTE — Progress Notes (Signed)
PT Cancellation Note  Patient Details Name: Stanley Moore MRN: [id number] DOB: [birth year]   Cancelled Treatment:    Reason Eval/Treat Not Completed: Other (comment) [name], citing fatigue, despite encouragement to participate.  [name], PT, DPT Acute Rehabilitation Services Pager [phone] Office [phone]    [name] 12/04/2020, 12:03 PM

## 2020-12-04 NOTE — Plan of Care (Signed)
  Problem: Clinical Measurements: Goal: Will remain free from infection Outcome: Adequate for Discharge   

## 2020-12-04 NOTE — Discharge Summary (Addendum)
[facility] Discharge Summary  Patient name: Stanley Moore Medical record number: [id number] Date of birth: [birth year] Age: 87 y.o. Gender: male Date of Admission: 11/23/2020  Date of Discharge: 12/04/20 Admitting Physician: Gerrit Heck, MD  Primary Care Provider: Heywood Bene, PA-C Consultants: Cardiology, Oncology, Palliative, IR (s/o), Gastroenterology  Indication for Hospitalization: Multiple fractures after fall  Discharge Diagnoses/Problem List:  Weakness & Low Back Pain s/p Multiple Falls  Multiple Transverse Process Fractures  Left 12th Rib Fx AKI on CKD Stage 4 Macrocytic anemia  Pancytopenia T2DM CHF exacerbation  HFrEF 25-30%  Hx LBBB  mod MR - resolved Persistent A. fib s/p  Cardioversion 2021 Recent NSTEMI 10/13/2020 Constipation Urinary Retention  BPH HLD CAD  Disposition: SNF  Discharge Condition: Improved  Discharge Exam:  Temp:  [97.6 F (36.4 C)-98.4 F (36.9 C)] 97.6 F (36.4 C) (11/29 0501) Pulse Rate:  [59-67] 67 (11/29 0501) Resp:  [16] 16 (11/29 0501) BP: (122-126)/(59-64) 122/59 (11/29 0501) SpO2:  [94 %-99 %] 98 % (11/29 0501) Weight:  [85.6 kg] 85.6 kg (11/29 0501) Physical Exam: General: Frail, elderly, well appearing, white male, NASD Cardiovascular: Distant heart sounds with RRR, NRMG Respiratory: CTABL Abdomen: Soft, NTTP, non-distended Extremities: No edema, moving all extremities independently  Brief Hospital Course:  Stanley Moore is a 87 y.o. male presenting with weakness and low back pain s/p fall, found to have multiple transverse process fractures as well as left 12th rib fracture. PMH is significant for hypertension, hyperlipidemia, T2DM, CKD 3b, persistent A. 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 His hospital course is below.   Weakness & Low Back Pain s/p Multiple Falls Multiple Transverse Process Fractures  Left 12th Rib  Fx Patient presented to the ED after having multiple falls over the weekend where his legs felt like they were buckling. He denied any kind of lightheadedness, loss of consciousness, or head trauma. Today patient felt more weak than usual and had extreme pain with any movement after falling on chair on Monday. His family called his cardiologist and PCP who told him to call 911. EMS found patient to have heart rates of 48 and tried a fluid bolus but then had heart rates in the 30s on recordings with difficulty obtaining accurate readings. EMS initiated pacing 5 minutes before arriving to the ED which was subsequently stopped in ED. In the ED imaging of the chest showed stable cardiomegaly with basilar interstitial lung disease and left pleural effusion blunting the left costophrenic angle. CT abdomen pelvis showed multiple transverse process fractures L1-L3 and corner fracture of L1 vertebral body, as well as a left 12th rib fracture.  Labs showed normal potassium and magnesium, bicarb low at 15, and AKI to 2.68 on admission.  Troponins were trended which were 65>64.  Lactic acid was normal and had macrocytic anemia and thrombocytopenia discussed in problems below. Vitals in the ED showed bradycardia in the high 40s to 50s, some soft blood pressures to 110s over 40s-50s saturating in high 90s on room air. On exam patient laying flat on bed not able to move much without pain.  When laying completely flat and not moving patient rates his pain 0/10. Patient continued to progress while inpatient, working with PT and OT with good pain control and improving mobility.    HFrEF 25-30%  Hx of left bundle branch block  Moderate mitral regurgitation   Patient is followed by cardiology at [place], last visit  11/01/2020. EKG on admission showed sinus bradycardia to 52.  Last echo on 11/13/2020 showed EF of 25-30% with severely decreased function of left ventricle with grade 2 diastolic dysfunction.  The  mitral valve was degenerative and showed severe mitral valve regurgitation. Per cardiology office note, patient has refused cardiac catheterizations in the past, and does not want ICD placement, or invasive angiography, but wants conservative approach and is DNR.  Had conversations with cardiologist on risk of sudden cardiac death.  Wants just medical therapy. Home medications of carvedilol, lasix 20 mg daily, and spironolactone. While admitted patient was lightly diuresed and followed by cardiology. His volume status was improved with no pitting edema by discharge.   AKI on CKD Stage 4: improving AKI on admission with Cr to 2.69. Baseline Cr around 1.8 in 2022. UA was clear for infection and showed greater than 500 glucose. Says he has been drinking same amount as usual over this past week.  Creatinine improved throughout admission, was 2.14 on discharge. Recommend PCP to follow up.   Pancytopenia concerning for malignancy: stable Hemoglobin of 8.6 on admission with PLT 88, and WBC 4.2.  Past hemoglobin recorded in epic of 9.4 and 11.6 in 2022 and 2021.  MCV was elevated to 114.3.  Does not currently drink any alcohol but had previous history of drinking 1 shot of liquor daily unsure of how many years.  AST/ALT were within normal limits on CMP at 13 and 18 respectively.  He had no altered mental status, and sensation was intact on feet bilaterally.  Says he has never gotten a colonoscopy before and his stools range from a pale gray color to brown. Denies any hematuria or hematochezia.  No abdominal pain on examination.  Last TSH in 2021 of 2.74.  No active bleeding visualized. Patient was transfused 2 unit of pRBC to improve Hgb. Patient had positive FOBT and was offered EGD by gastroenterology, but declined, as he didn't want to spend more time in the hospital. Patient was tested for cause of macrocytic anemia: LDH and reticulocytes were measured to evaluate for hemolysis, and were non-concerning.  Homocysteine and Methylmalonic acid were wnl non-concerning for B12 or Folate deficiency.   A blood smear was positive for burr cells, and thrombocytopenia. Serum free light chains (kappa and lambda), were elevated. All three cell lines continued to trend down, patient received bone marrow biopsy that was not resulted by discharge.  Urinary Retention  BPH Patient developed issue retaining urine, found on bladder scan. After 3rd episode, foley was placed. Foley placed on 12/01/20.  Goals of Care Patient met with Chaplain services and notary 12/03/20 to name Gerald Leitz as HCPOA, with Foye Deer as next choice.    All other problems were chronic and stable  Issues for follow up: 1) Consider ortho outpatient for fractures 2) Elevated TSH, consider follow-up outpatient for repeat. Possibly elevated as an acute phase reactant. 3) Discontinue Eliquis due to high risk of falls; elevated HASBLED score.  4) Follow up bone marrow biopsy results 5) Consider follow up positive FOBT 6) He has 01/14/21 f/u with Primary cardiologist 7) AKI: Baseline 1.8. Cr 2.14 by discharge. Recommend PCP to recheck at follow up.    Significant Procedures:  Bone Marrow Biopsy - 11/28/20  Significant Labs and Imaging:  Recent Labs  Lab 12/02/20 0326 12/03/20 0441 12/04/20 0221  WBC 3.0* 2.6* 2.8*  HGB 8.2* 8.4* 8.5*  HCT 25.7* 25.9* 26.9*  PLT 79* 90* 100*   Recent Labs  Lab  11/28/20 0255 11/29/20 0304 11/30/20 0325 12/01/20 0312 12/03/20 0441  NA 136 138 137 134* 138  K 4.7 4.8 4.6 4.5 4.3  CL 109 114* 111 109 113*  CO2 17* 18* 19* 17* 19*  GLUCOSE 146* 140* 139* 127* 119*  BUN 78* 68* 70* 67* 51*  CREATININE 2.36* 2.34* 2.54* 2.42* 2.14*  CALCIUM 8.3* 8.3* 8.3* 8.2* 8.3*    PORTABLE CHEST 1 VIEW 11/23/20  IMPRESSION: Stable cardiomegaly and basilar interstitial lung disease. Small left pleural effusion blunting the left costophrenic angle. Aortic Atherosclerosis (ICD10-I70.0).  CT  ABDOMEN AND PELVIS WITHOUT CONTRAST 11/23/20 IMPRESSION: Bilateral pleural effusions with mild atelectatic changes.   Mild nodularity to the liver without focal mass likely representing mild cirrhosis.   Right adrenal lesion likely representing an adenoma but incompletely evaluated on this exam. Given the patient's age no further follow-up is recommended.   Diverticulosis without diverticulitis is noted. Bilateral inguinal hernias are noted with sigmoid colon herniating into the left inguinal hernia.   Multiple transverse process fractures as well as a left twelfth rib fracture. Additionally anterior inferior corner fracture of L1 is noted. This will be better evaluated on upcoming lumbar spine CT.  CT LUMBAR SPINE WITHOUT CONTRAST 11/23/20 IMPRESSION: 1. Fracture through the anterior inferior endplate of L1, which appears acute or subacute. 2. Osseous bridging along the anterior aspect of the vertebral bodies, most likely diffuse idiopathic skeletal hyperostosis, with additional bridging along the spinous processes. 3. L4-L5 severe spinal canal stenosis and moderate bilateral neural foraminal narrowing. 4. L3-L4 mild spinal canal stenosis and mild bilateral neural foraminal narrowing. 5. For soft tissue findings, please see same-day CT abdomen pelvis.  CT-GUIDED BONE MARROW BIOPSY AND ASPIRATION 11/28/20 INDICATION: Pancytopenia of uncertain etiology. Please perform CT-guided bone marrow biopsy for tissue diagnostic purposes. IMPRESSION: Successful CT guided left iliac bone marrow aspiration and core biopsy.  Results/Tests Pending at Time of Discharge: Bone Marrow Biopsy  Discharge Medications:  Allergies as of 12/04/2020   No Known Allergies      Medication List     STOP taking these medications    carvedilol 12.5 MG tablet Commonly known as: COREG   clopidogrel 75 MG tablet Commonly known as: PLAVIX   Eliquis 2.5 MG Tabs tablet Generic drug: apixaban    hydrALAZINE 25 MG tablet Commonly known as: APRESOLINE   isosorbide mononitrate 30 MG 24 hr tablet Commonly known as: IMDUR   Jardiance 10 MG Tabs tablet Generic drug: empagliflozin   spironolactone 25 MG tablet Commonly known as: ALDACTONE       TAKE these medications    acetaminophen 325 MG tablet Commonly known as: TYLENOL Take 2 tablets (650 mg total) by mouth every 6 (six) hours.   amiodarone 100 MG tablet Commonly known as: PACERONE TAKE 1 TABLET BY MOUTH EVERY DAY   furosemide 20 MG tablet Commonly known as: LASIX TAKE 1 TABLET BY MOUTH EVERY DAY   liver oil-zinc oxide 40 % ointment Commonly known as: DESITIN Apply topically daily as needed for irritation.   nitroGLYCERIN 0.4 MG SL tablet Commonly known as: NITROSTAT Place 1 tablet (0.4 mg total) under the tongue every 5 (five) minutes x 3 doses as needed for chest pain.   oxyCODONE 5 MG immediate release tablet Commonly known as: Oxy IR/ROXICODONE Take 1 tablet (5 mg total) by mouth every 4 (four) hours as needed for up to 3 days for moderate pain or severe pain.   pantoprazole 40 MG tablet Commonly known as: PROTONIX Take 1  tablet (40 mg total) by mouth daily. Start taking on: December 05, 2020   polyethylene glycol 17 g packet Commonly known as: MIRALAX / GLYCOLAX Take 17 g by mouth daily as needed for mild constipation or moderate constipation.   senna 8.6 MG Tabs tablet Commonly known as: SENOKOT Take 1 tablet (8.6 mg total) by mouth daily as needed for mild constipation.   simvastatin 20 MG tablet Commonly known as: ZOCOR Take 1 tablet (20 mg total) by mouth every evening.   tamsulosin 0.4 MG Caps capsule Commonly known as: FLOMAX Take 2 capsules (0.8 mg total) by mouth daily after breakfast. Start taking on: December 05, 2020        Discharge Instructions: Please refer to Patient Instructions section of EMR for full details.  Patient was counseled important signs and symptoms that  should prompt return to medical care, changes in medications, dietary instructions, activity restrictions, and follow up appointments.   Follow-Up Appointments:  Contact information for after-discharge care     [facility] .   Service: Skilled Nursing Contact information: [address] [place] [phone]                     Holley Bouche, MD 12/04/2020, 12:52 PM PGY-1, [facility]  Upper Level Addendum: I have reviewed the above note, making necessary revisions as appropriate. I agree with the medical decision making and physical exam as noted above. Ezequiel Essex, MD PGY-2 [facility]

## 2020-12-05 ENCOUNTER — Telehealth: Payer: Self-pay | Admitting: Family Medicine

## 2020-12-05 LAB — SURGICAL PATHOLOGY

## 2020-12-05 NOTE — Telephone Encounter (Signed)
[facility] where the patient was discharged to relay message to patient as he did not answer the phone number listed.    Spoke with pt's Daughter, Harmon Dun, regarding his abnormal bone marrow biopsy results. 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 Provided her with [facility] contact information and the consulting physician who saw him in the office. She will contact the cancer center directly.   Lyndee Hensen, DO PGY-3, [facility] 12/05/2020

## 2020-12-06 ENCOUNTER — Telehealth: Payer: Self-pay | Admitting: Hematology and Oncology

## 2020-12-06 NOTE — Telephone Encounter (Signed)
Today I spoke with Mr. Royden Bulman his daughter regarding the results of his bone marrow biopsy.  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  I explained to her that this is an aggressive malignancy that needs immediate attention at a specialized leukemia center.  I recommended referral to [facility], however we could also refer to [facility] or [facility].  The patient's daughter notes that he is currently admitted to a facility and would not likely want to pursue chemotherapy.  I strongly encouraged them to at least meet with the leukemia expert to discuss possible treatment options and what to expect moving forward.  I expressed to her the poor prognosis associated with this malignancy.  I also noted that if he did not wish to pursue chemotherapy he would be immediately eligible for hospice.  She noted she would speak with her father and consider the options moving forward.  We strongly recommend that she get back to [place] as soon as possible so we can make a referral as needed.  She has our contact number and will reach back out to [place] when she is had the opportunity to discuss this with her father and family.  Ledell Peoples, MD Department of Hematology/Oncology [place] [facility] Phone: [phone] Pager: [phone] Email: [email or web address]

## 2020-12-07 ENCOUNTER — Telehealth: Payer: Self-pay | Admitting: *Deleted

## 2020-12-07 NOTE — Telephone Encounter (Signed)
Received call from pt's daughter, Stanley Moore. She is calling back after discussing her father's diagnosis and possible treatment of his ALL. She and her brother were on the phone.  She states that her father does not want to pursue any treatment. She states he does not feel the need to talk with anyone @ [facility]. She did ask if there was anything else other than aggressive chemo that could be done. Advised that he could have labs monitored weekly for drop in counts and transfuse blood/platelets as needed. Advised that he health could deteriorate quickly as he is unable to produce sufficient blood cells. Advised that given his decision to not seek treatment, his best option is Hospice services. Stanley asked about life expectancy. Advised that it is likely weeks to a few months, advised that his situation could change quickly. She states he is currently in rehab and up with his walker. The plan is to transition to to Assisted Living @ [facility] when rehab is completed. Advised that Hospice can start with him in either place and it would be best not to delay their assistance in his care. Stanley asked if he would need transfusions in the future. Advised that his blood counts could change rapidly and if they wanted to purue following them and get him transfusions as needed , we could do that for as long as it was useful. Advised he would need weekly labs and blood sample to blood bank in case of transfusion needs for blood/platelet transfusion. Also advised that Dr. Lorenso Courier will need to see him in our clinic to revciew all of the above with pt and family. Stanley wants to see if some lab work can be done @ the current facility.  Advised that he would still need to come here to blood bank needs, transfusion and MD visits. She states she will check with the facility about labs and call me back Providedoffice # and Fax # Daughter declined Hospice at this time.  Will address this again with next  call. Dr. Lorenso Courier aware of the above.

## 2020-12-10 ENCOUNTER — Encounter (HOSPITAL_COMMUNITY): Payer: Self-pay

## 2020-12-10 ENCOUNTER — Telehealth: Payer: Self-pay | Admitting: *Deleted

## 2020-12-10 NOTE — Telephone Encounter (Signed)
Received call from Manus Gunning, intake specialist with [facility]. She states that this pt's family has reached out to them , requesting their Hospice services upon his discharge from [facility] to home this week. Manus Gunning asked if Dr. Lorenso Courier would be the attending. Advised that he would. Manus Gunning states that a Hospice representative will be going to meet with family, perhaps today, to get details of discharge and arrange for DME.  Dr. Lorenso Courier made aware of the above.

## 2021-01-06 DEATH — deceased

## 2021-01-11 NOTE — Progress Notes (Deleted)
Cardiology Office Note:   Date:  01/11/2021  NAME:  Stanley Moore    MRN: [id number] DOB:  10-24-1931   PCP:  Stanley Bene, PA-C  Cardiologist:  Stanley Field, MD  Electrophysiologist:  Stanley Grayer, MD   Referring MD: Stanley Moore, *   No chief complaint on file. ***  History of Present Illness:   Stanley Moore is a 88 y.o. male with a hx of *** who is being seen today for the evaluation of *** at the request of ***.  Problem List 1. HTN 2. HLD -T chol 109, HDL 33, LDL 53, triglycerides 115 3. DM -A1c 6.7 4. CKD IV 5. Persistent Afib -DCCV 06/27/2019 6. Systolic HF, 32-95% -normal MPI -EF 12/15/2019 -> 25% -only interested in medical management  7. 1AVB -354 msec 8. LBBB -QRS 166 ms 9. Moderate MR -ERO 0.15 cm2, R vol 32 cc, RF 40% 10. CAD -NSTEMI 10/13/2020 -managed medically (patient preference) 11. ALL  Past Medical History: Past Medical History:  Diagnosis Date   CHF (congestive heart failure) (HCC)    CRI (chronic renal insufficiency), stage 3 (moderate) (HCC)    Diabetes mellitus without complication (HCC)    Hyperlipidemia    Hypertension    LBBB (left bundle branch block)    Moderate mitral regurgitation    Paroxysmal atrial fibrillation (HCC)     Past Surgical History: Past Surgical History:  Procedure Laterality Date   appendicitis     CARDIOVERSION N/A 06/27/2019   Procedure: CARDIOVERSION;  Surgeon: Stanley Munroe, MD;  Location: [facility];  Service: Cardiovascular;  Laterality: N/A;   MOLE REMOVAL      Current Medications: No outpatient medications have been marked as taking for the 01/14/21 encounter (Appointment) with Stanley Rile, MD.     Allergies:    Patient has no known allergies.   Social History: Social History   Socioeconomic History   Marital status: Married    Spouse name: Not on file   Number of children: 3   Years of education: Not on file   Highest education level: Not  on file  Occupational History   Not on file  Tobacco Use   Smoking status: Former    Years: 5.00    Types: Cigarettes   Smokeless tobacco: Never  Substance and Sexual Activity   Alcohol use: Yes    Comment: wine with dinner   Drug use: No   Sexual activity: Not on file  Other Topics Concern   Not on file  Social History Narrative   Not on file   Social Determinants of Health   Financial Resource Strain: Not on file  Food Insecurity: Not on file  Transportation Needs: Not on file  Physical Activity: Not on file  Stress: Not on file  Social Connections: Not on file     Family History: The patient's ***family history includes Diabetes in his mother.  ROS:   All other ROS reviewed and negative. Pertinent positives noted in the HPI.     EKGs/Labs/Other Studies Reviewed:   The following studies were personally reviewed by me today:  EKG:  EKG is *** ordered today.  The ekg ordered today demonstrates ***, and was personally reviewed by me.   Recent Labs: 11/24/2020: B Natriuretic Peptide 1,126.0; TSH 5.194 11/26/2020: Magnesium 2.3 11/27/2020: ALT 16 12/03/2020: BUN 51; Creatinine, Ser 2.14; Potassium 4.3; Sodium 138 12/04/2020: Hemoglobin 8.5; Platelets 100   Recent Lipid Panel    Component Value Date/Time  CHOL 109 10/14/2020 0140   TRIG 115 10/14/2020 0140   HDL 33 (L) 10/14/2020 0140   CHOLHDL 3.3 10/14/2020 0140   VLDL 23 10/14/2020 0140   LDLCALC 53 10/14/2020 0140   LDLDIRECT 80.7 12/16/2012 1228    Physical Exam:   VS:  There were no vitals taken for this visit.   Wt Readings from Last 3 Encounters:  12/04/20 188 lb 11.4 oz (85.6 kg)  11/01/20 181 lb 9.6 oz (82.4 kg)  10/14/20 177 lb 4 oz (80.4 kg)    General: Well nourished, well developed, in no acute distress Head: Atraumatic, normal size  Eyes: PEERLA, EOMI  Neck: Supple, no JVD Endocrine: No thryomegaly Cardiac: Normal S1, S2; RRR; no murmurs, rubs, or gallops Lungs: Clear to auscultation  bilaterally, no wheezing, rhonchi or rales  Abd: Soft, nontender, no hepatomegaly  Ext: No edema, pulses 2+ Musculoskeletal: No deformities, BUE and BLE strength normal and equal Skin: Warm and dry, no rashes   Neuro: Alert and oriented to person, place, time, and situation, CNII-XII grossly intact, no focal deficits  Psych: Normal mood and affect   ASSESSMENT:   Stanley Moore is a 88 y.o. male who presents for the following: No diagnosis found.  PLAN:   There are no diagnoses linked to this encounter.  {Are you ordering a CV Procedure (e.g. stress test, cath, DCCV, TEE, etc)?   Press F2        :[id number]}  Disposition: No follow-ups on file.  Medication Adjustments/Labs and Tests Ordered: Current medicines are reviewed at length with the patient today.  Concerns regarding medicines are outlined above.  No orders of the defined types were placed in this encounter.  No orders of the defined types were placed in this encounter.   There are no Patient Instructions on file for this visit.   Time Spent with Patient: I have spent a total of *** minutes with patient reviewing hospital notes, telemetry, EKGs, labs and examining the patient as well as establishing an assessment and plan that was discussed with the patient.  > 50% of time was spent in direct patient care.  Signed, Stanley Moore. Audie Box, MD, [facility]  [address], [place], [place] [address] [phone]  01/11/2021 4:31 PM

## 2021-01-14 ENCOUNTER — Ambulatory Visit: Payer: Medicare HMO | Admitting: Cardiovascular Disease

## 2023-06-28 IMAGING — CT CT BIOPSY AND ASPIRATION BONE MARROW
1 of 4 series · 12 of 32 positions shown, 18 images · non-contrast
Comparison: none

INDICATION: Pancytopenia of uncertain etiology. Please perform CT-guided bone
marrow biopsy for tissue diagnostic purposes.

[Series 3: i-spiral 5.0 b40f · axial · 0.87mm/px · z∈[+830,+928]mm · 12 of 34 slices shown, 18 images]
[im 3/34  soft-tissue]
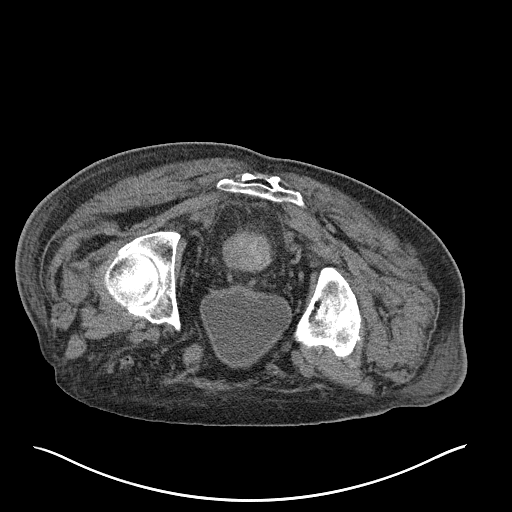
[im 3/34  bone]
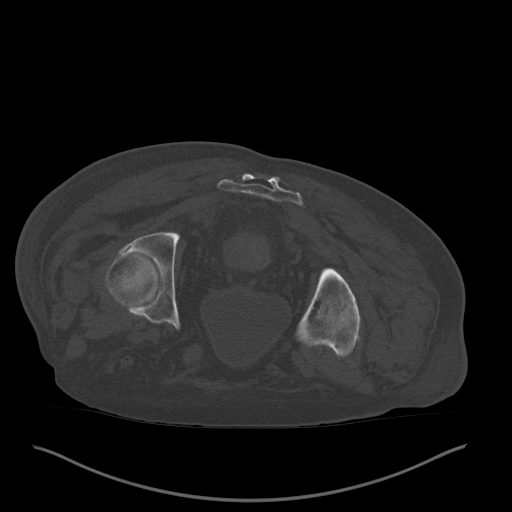
[im 6/34  soft-tissue]
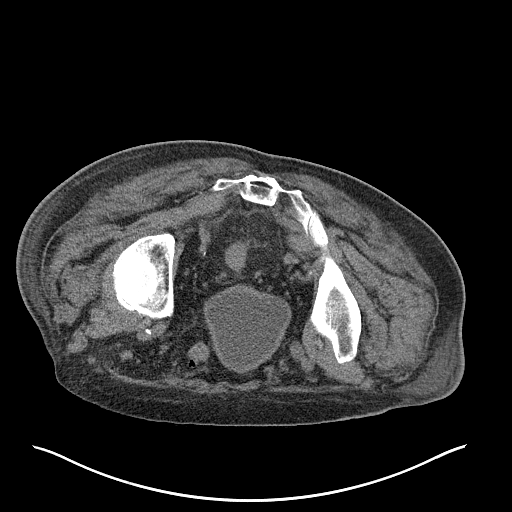
[im 8/34  soft-tissue]
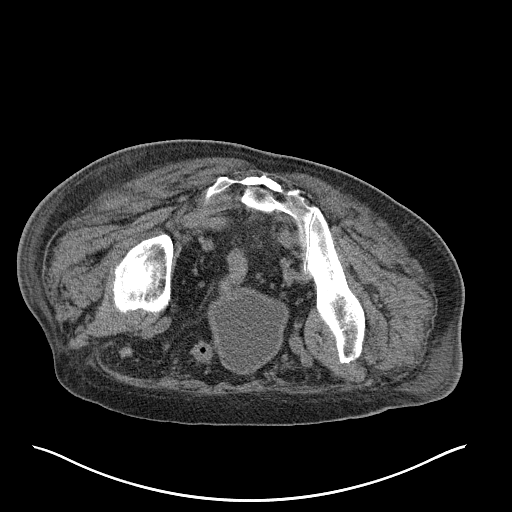
[im 11/34  soft-tissue]
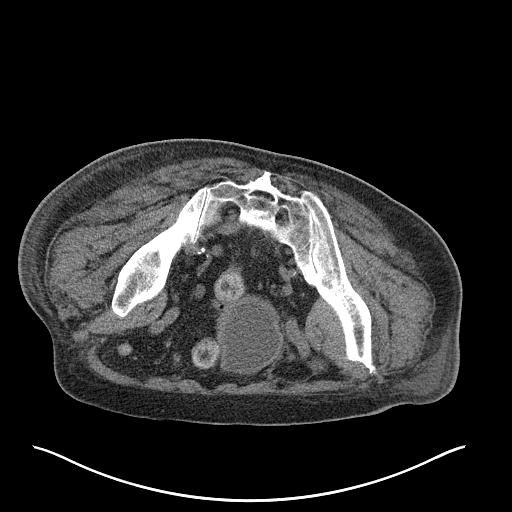
[im 13/34  soft-tissue]
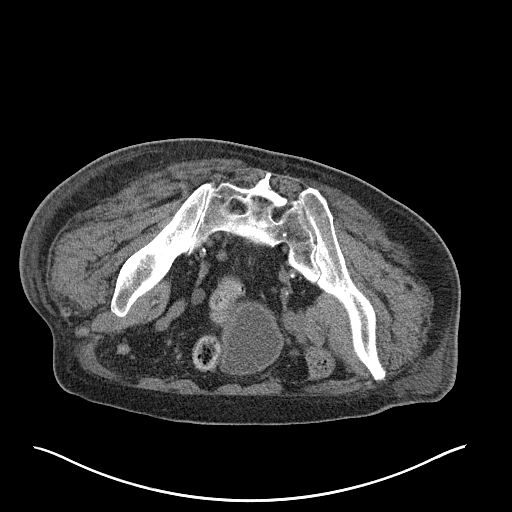
[im 16/34  soft-tissue]
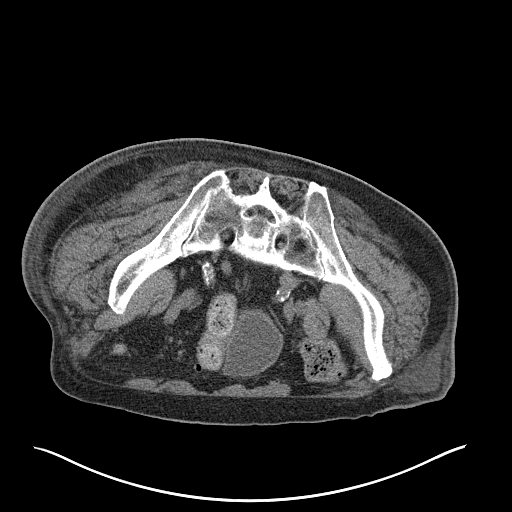
[im 18/34  soft-tissue]
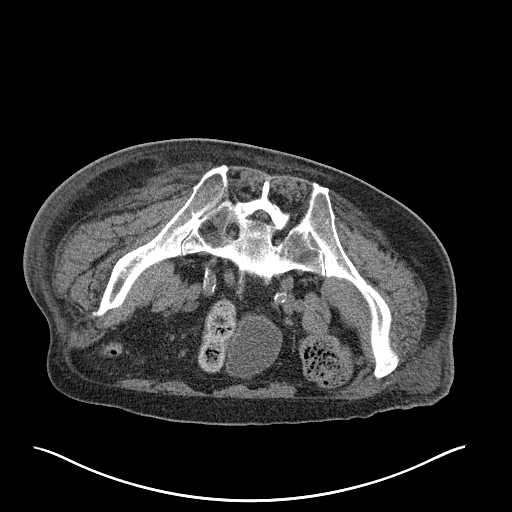
[im 21/34  soft-tissue]
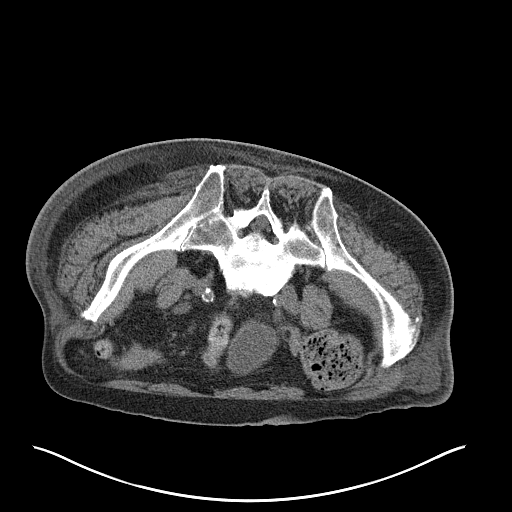
[im 23/34  soft-tissue]
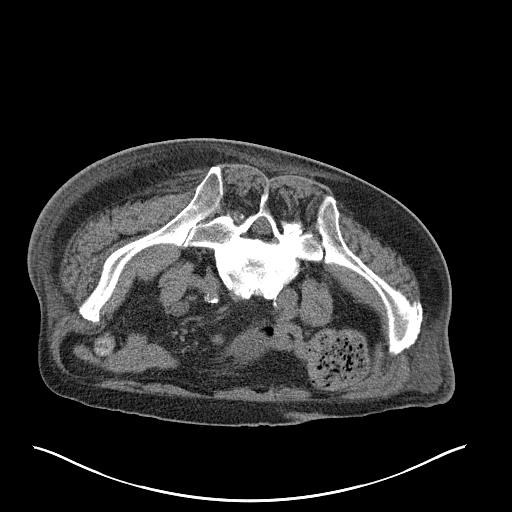
[im 23/34  lung]
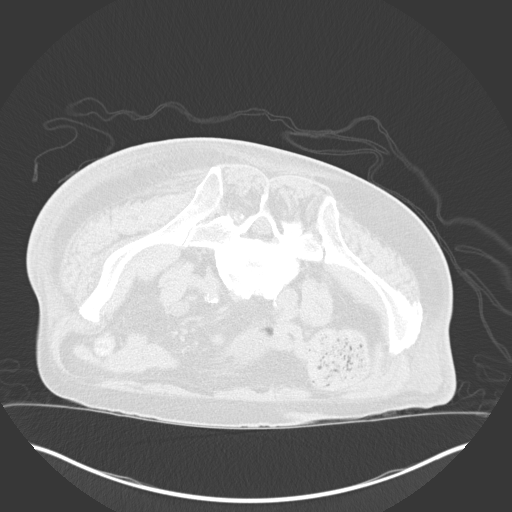
[im 23/34  bone]
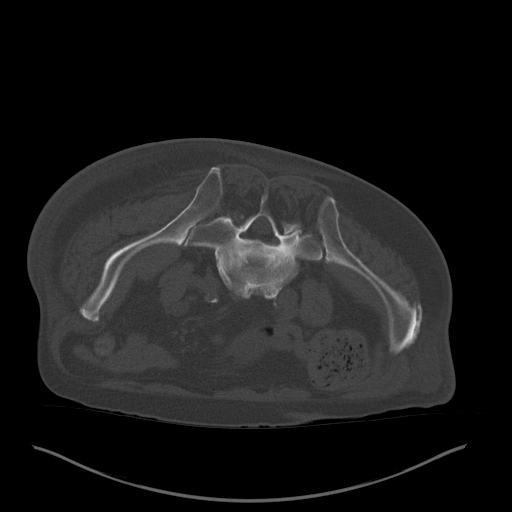
[im 26/34  soft-tissue]
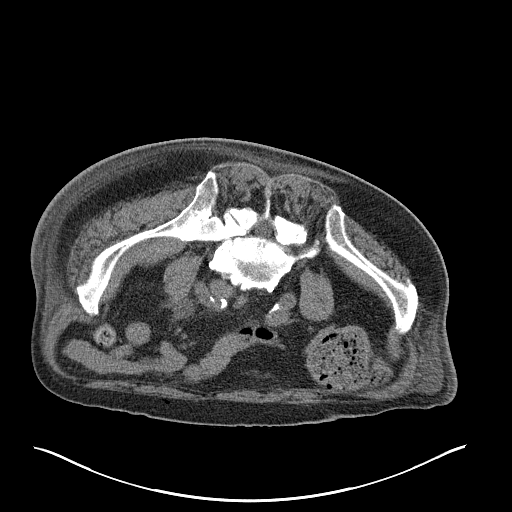
[im 26/34  lung]
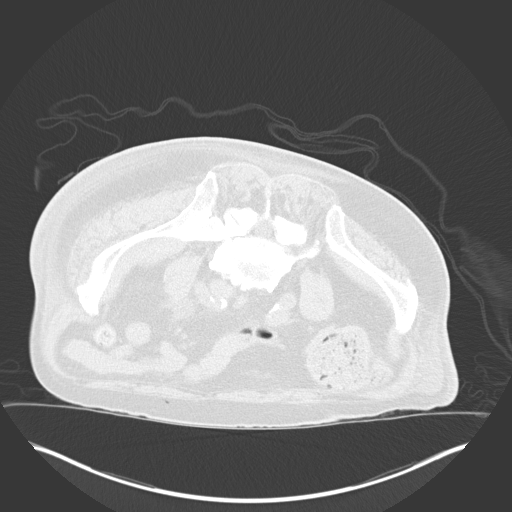
[im 28/34  soft-tissue]
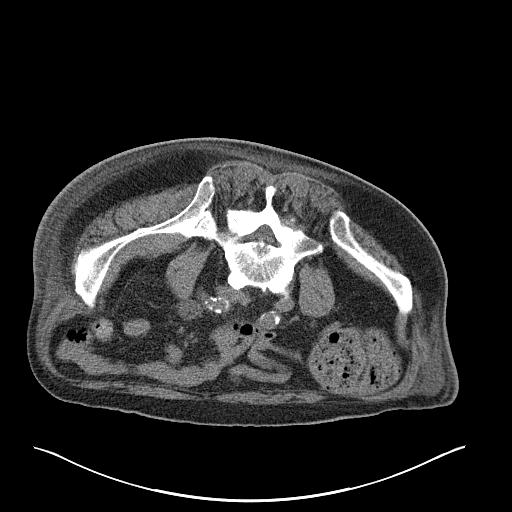
[im 28/34  lung]
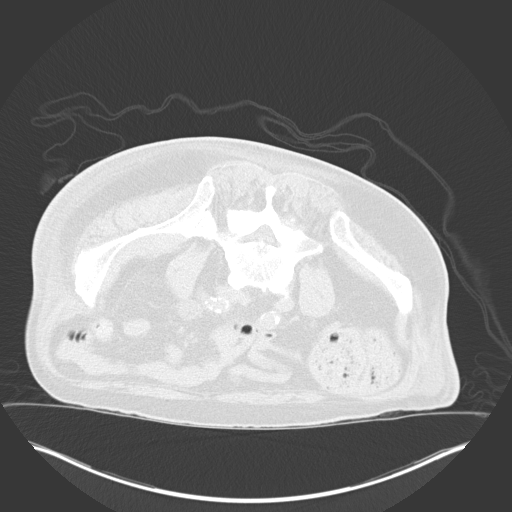
[im 31/34  soft-tissue]
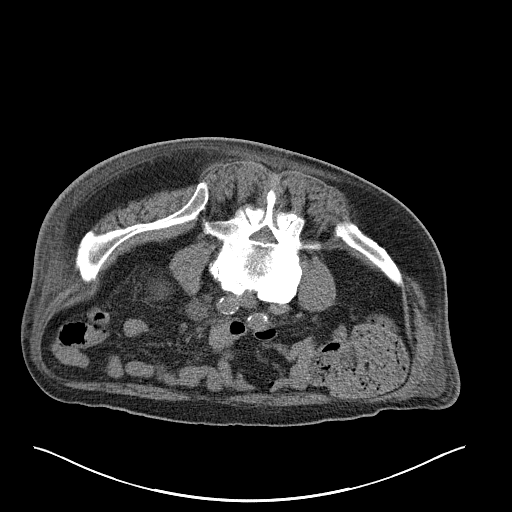
[im 31/34  lung]
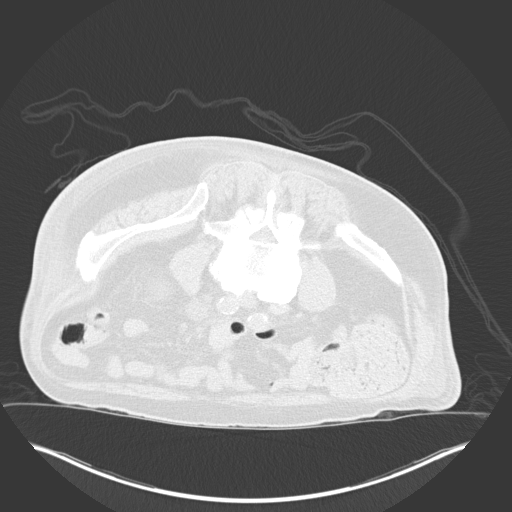

[12 of 32 positions shown; findings below may reference images not displayed]

EXAM:
CT-GUIDED BONE MARROW BIOPSY AND ASPIRATION

MEDICATIONS:
None

ANESTHESIA/SEDATION:
Moderate (conscious) sedation was employed during this procedure as
administered by the [REDACTED].

A total of Versed 1.5 mg and Fentanyl 75 mcg was administered
intravenously.

Moderate Sedation Time: 11 minutes. The patient's level of
consciousness and vital signs were monitored continuously by
radiology nursing throughout the procedure under my direct
supervision.

COMPLICATIONS:
None immediate.

PROCEDURE:
Informed consent was obtained from the patient following an
explanation of the procedure, risks, benefits and alternatives. The
patient understands, agrees and consents for the procedure. All
questions were addressed. A time out was performed prior to the
initiation of the procedure.

The patient was positioned prone and non-contrast localization CT
was performed of the pelvis to demonstrate the iliac marrow spaces.
The operative site was prepped and draped in the usual sterile
fashion.

Under sterile conditions and local anesthesia, a 22 gauge spinal
needle was utilized for procedural planning. Next, an 11 gauge
coaxial bone biopsy needle was advanced into the left iliac marrow
space. Needle position was confirmed with CT imaging. Initially, a
bone marrow aspiration was performed. Next, a bone marrow biopsy was
obtained with the 11 gauge outer bone marrow device. The 11 gauge
coaxial bone biopsy needle was re-advanced into a slightly different
location within the left iliac marrow space, positioning was
confirmed with CT imaging and an additional bone marrow biopsy was
obtained. Samples were prepared with the cytotechnologist and deemed
adequate. The needle was removed and superficial hemostasis was
obtained with manual compression. A dressing was applied. The
patient tolerated the procedure well without immediate post
procedural complication.
IMPRESSION: Successful CT guided left iliac bone marrow aspiration and core
biopsy.
